# Patient Record
Sex: Female | Born: 1960
Health system: Southern US, Community
[De-identification: ages and names within clinical notes are randomized; demographics above are authoritative.]

## PROBLEM LIST (undated history)

## (undated) DIAGNOSIS — Z9889 Other specified postprocedural states: Secondary | ICD-10-CM

## (undated) DIAGNOSIS — E119 Type 2 diabetes mellitus without complications: Secondary | ICD-10-CM

## (undated) DIAGNOSIS — M1990 Unspecified osteoarthritis, unspecified site: Secondary | ICD-10-CM

## (undated) DIAGNOSIS — R519 Headache, unspecified: Secondary | ICD-10-CM

## (undated) DIAGNOSIS — R112 Nausea with vomiting, unspecified: Secondary | ICD-10-CM

## (undated) DIAGNOSIS — C801 Malignant (primary) neoplasm, unspecified: Secondary | ICD-10-CM

## (undated) DIAGNOSIS — J45909 Unspecified asthma, uncomplicated: Secondary | ICD-10-CM

## (undated) DIAGNOSIS — R51 Headache: Secondary | ICD-10-CM

## (undated) DIAGNOSIS — G4730 Sleep apnea, unspecified: Secondary | ICD-10-CM

## (undated) DIAGNOSIS — E785 Hyperlipidemia, unspecified: Secondary | ICD-10-CM

## (undated) DIAGNOSIS — F419 Anxiety disorder, unspecified: Secondary | ICD-10-CM

## (undated) HISTORY — DX: Type 2 diabetes mellitus without complications: E11.9

## (undated) HISTORY — PX: BREAST SURGERY: SHX581

## (undated) HISTORY — DX: Sleep apnea, unspecified: G47.30

## (undated) HISTORY — PX: MASTECTOMY: SHX3

## (undated) HISTORY — DX: Hyperlipidemia, unspecified: E78.5

## (undated) HISTORY — DX: Unspecified asthma, uncomplicated: J45.909

## (undated) HISTORY — PX: ABDOMINAL HYSTERECTOMY: SHX81

## (undated) HISTORY — PX: TOTAL ABDOMINAL HYSTERECTOMY: SHX209

## (undated) HISTORY — PX: KNEE ARTHROSCOPY: SUR90

## (undated) HISTORY — PX: BILATERAL TOTAL MASTECTOMY WITH AXILLARY LYMPH NODE DISSECTION: SHX6364

---

## 2008-09-30 ENCOUNTER — Ambulatory Visit: Payer: Self-pay | Admitting: Internal Medicine

## 2008-10-19 ENCOUNTER — Ambulatory Visit: Payer: Self-pay | Admitting: Internal Medicine

## 2008-12-17 ENCOUNTER — Ambulatory Visit: Payer: Self-pay | Admitting: Internal Medicine

## 2008-12-23 ENCOUNTER — Ambulatory Visit: Payer: Self-pay | Admitting: Surgery

## 2008-12-28 ENCOUNTER — Ambulatory Visit: Payer: Self-pay | Admitting: Internal Medicine

## 2009-01-12 ENCOUNTER — Inpatient Hospital Stay: Payer: Self-pay | Admitting: Surgery

## 2009-01-17 ENCOUNTER — Ambulatory Visit: Payer: Self-pay | Admitting: Internal Medicine

## 2009-02-14 ENCOUNTER — Ambulatory Visit: Payer: Self-pay | Admitting: Internal Medicine

## 2009-02-21 ENCOUNTER — Ambulatory Visit: Payer: Self-pay | Admitting: Internal Medicine

## 2009-03-17 ENCOUNTER — Ambulatory Visit: Payer: Self-pay | Admitting: Internal Medicine

## 2009-04-16 ENCOUNTER — Ambulatory Visit: Payer: Self-pay | Admitting: Internal Medicine

## 2009-05-17 ENCOUNTER — Ambulatory Visit: Payer: Self-pay | Admitting: Internal Medicine

## 2009-06-16 ENCOUNTER — Ambulatory Visit: Payer: Self-pay | Admitting: Internal Medicine

## 2009-06-29 ENCOUNTER — Ambulatory Visit: Payer: Self-pay | Admitting: Unknown Physician Specialty

## 2009-06-30 ENCOUNTER — Inpatient Hospital Stay: Payer: Self-pay | Admitting: Obstetrics & Gynecology

## 2009-07-17 ENCOUNTER — Ambulatory Visit: Payer: Self-pay | Admitting: Internal Medicine

## 2009-08-11 ENCOUNTER — Ambulatory Visit: Payer: Self-pay | Admitting: Internal Medicine

## 2009-08-17 ENCOUNTER — Ambulatory Visit: Payer: Self-pay | Admitting: Internal Medicine

## 2009-09-16 ENCOUNTER — Ambulatory Visit: Payer: Self-pay | Admitting: Internal Medicine

## 2009-10-17 ENCOUNTER — Ambulatory Visit: Payer: Self-pay | Admitting: Internal Medicine

## 2009-11-16 ENCOUNTER — Ambulatory Visit: Payer: Self-pay | Admitting: Internal Medicine

## 2009-12-12 ENCOUNTER — Ambulatory Visit: Payer: Self-pay | Admitting: Surgery

## 2010-01-02 IMAGING — US ULTRASOUND LEFT BREAST
1 series · 17 of 21 positions shown · non-contrast
Comparison: none

REASON FOR EXAM: left breast spiculated density   US if needed
COMMENTS:

[Series 1: ultrasound left breast · 17 of 21 slices shown]
[im 1/21]
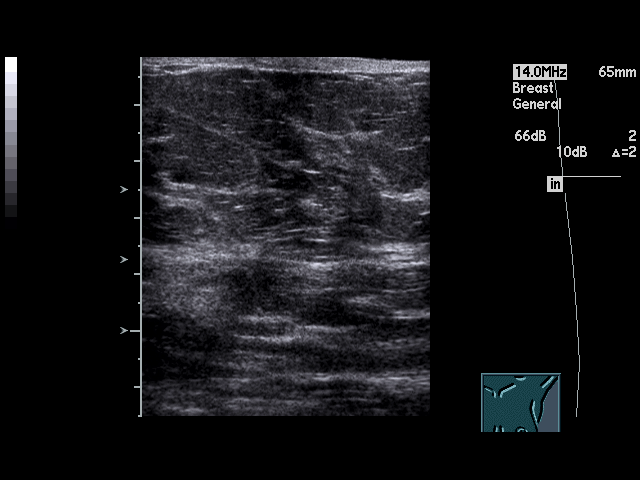
[im 2/21]
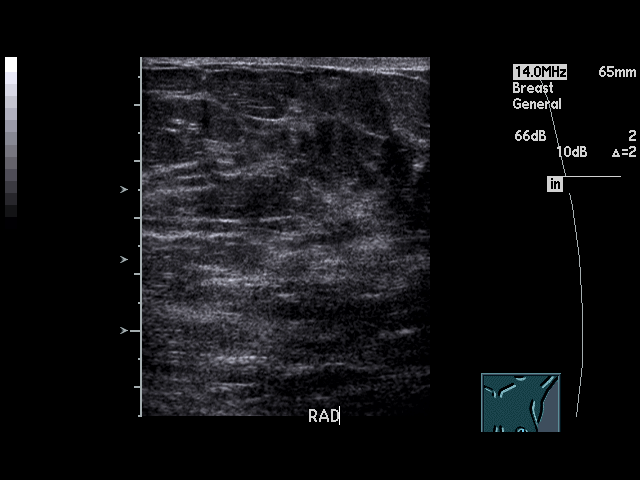
[im 4/21]
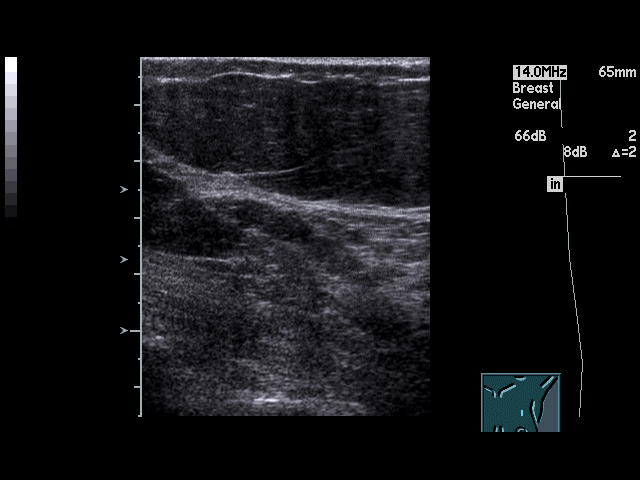
[im 5/21]
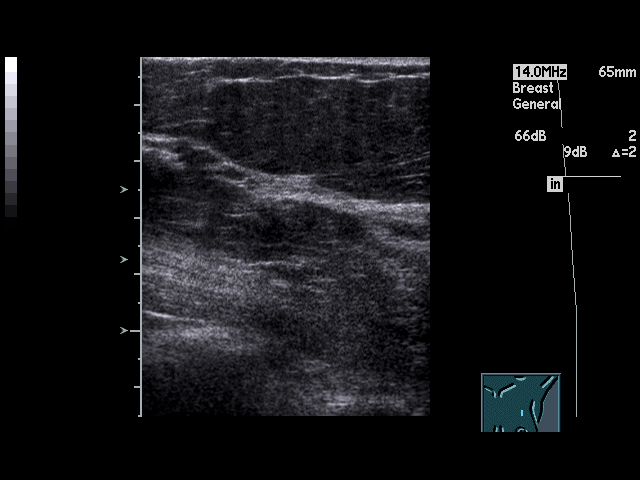
[im 6/21]
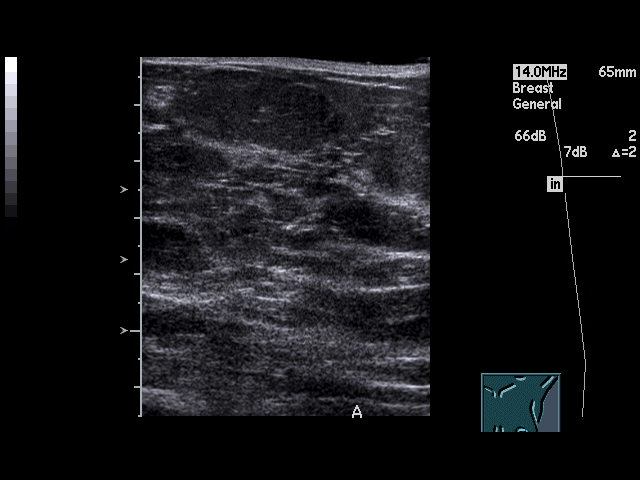
[im 7/21]
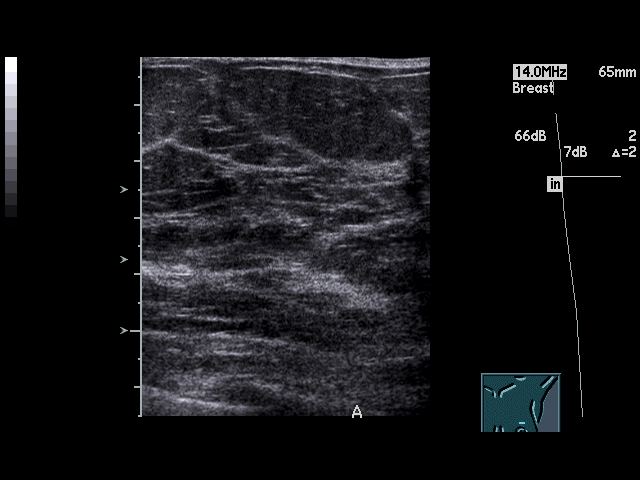
[im 9/21]
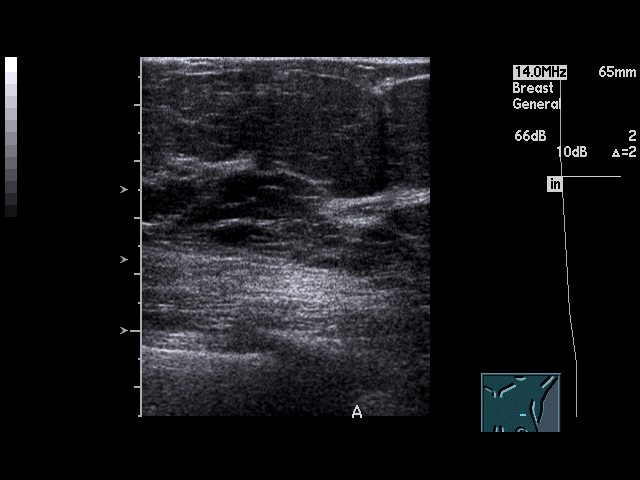
[im 10/21]
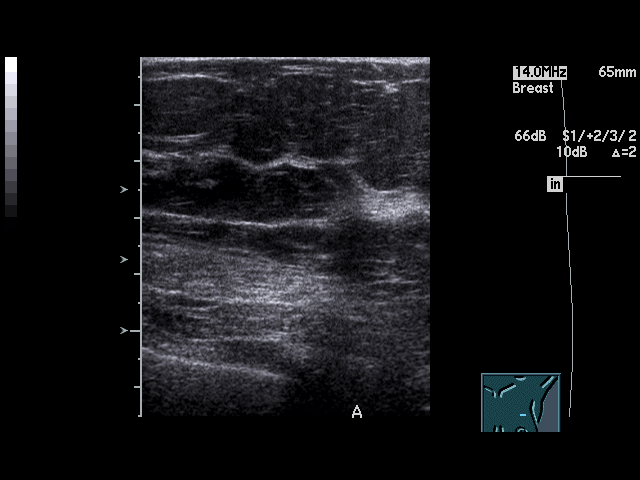
[im 11/21]
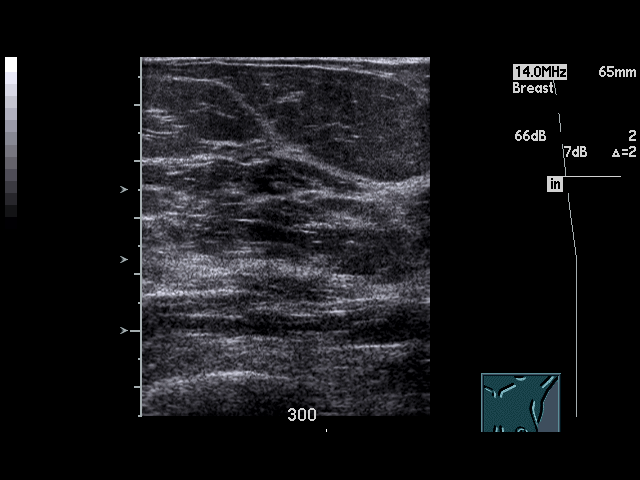
[im 12/21]
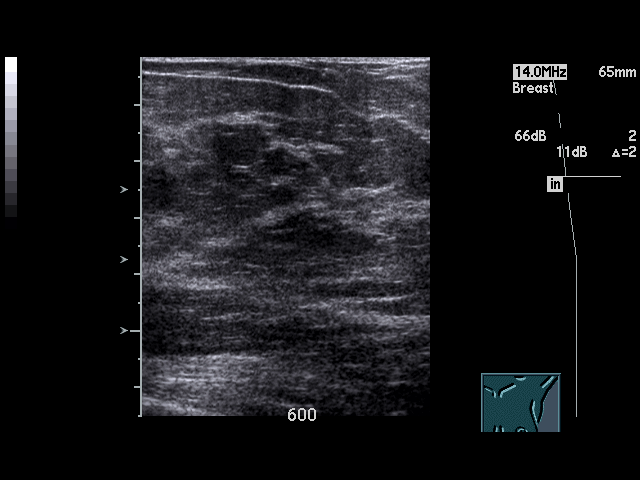
[im 13/21]
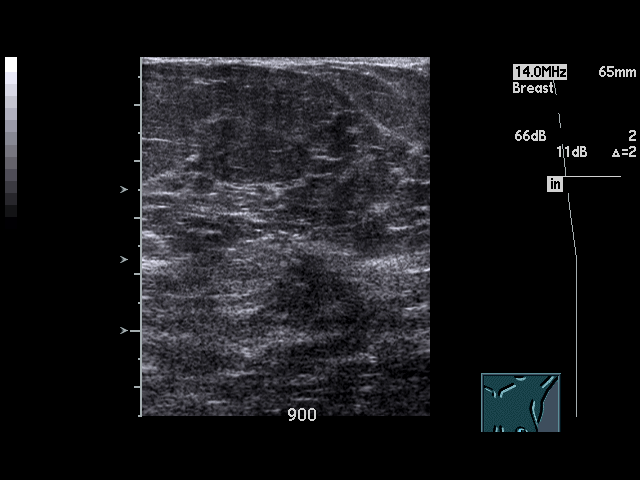
[im 15/21]
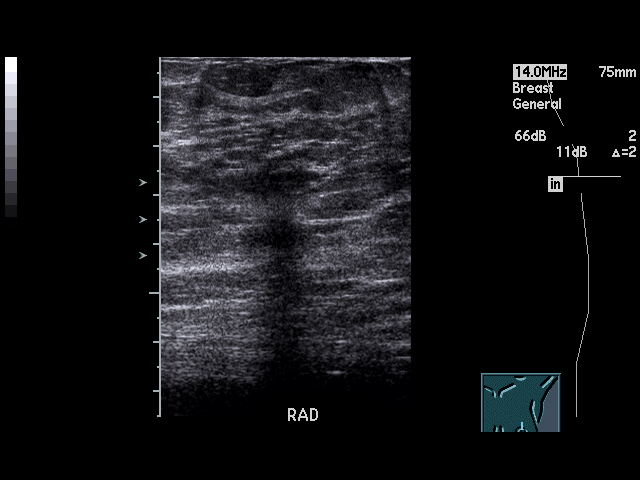
[im 16/21]
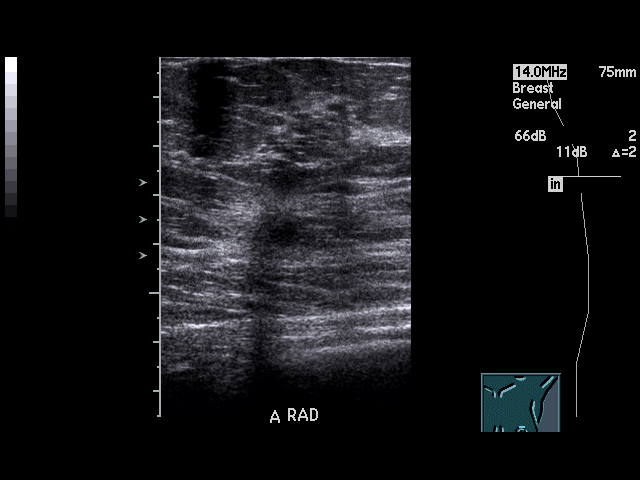
[im 17/21]
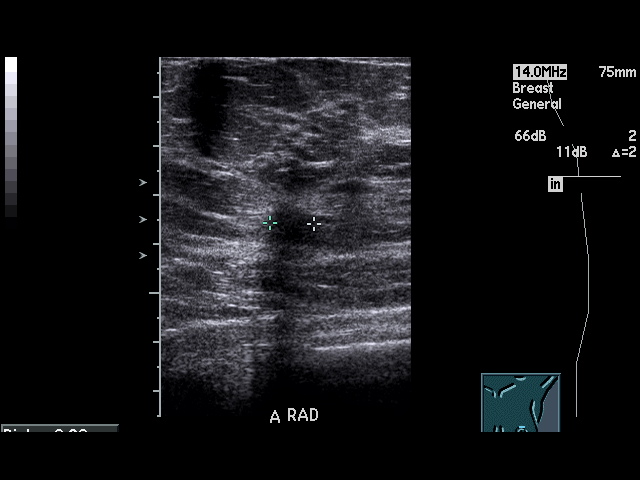
[im 18/21]
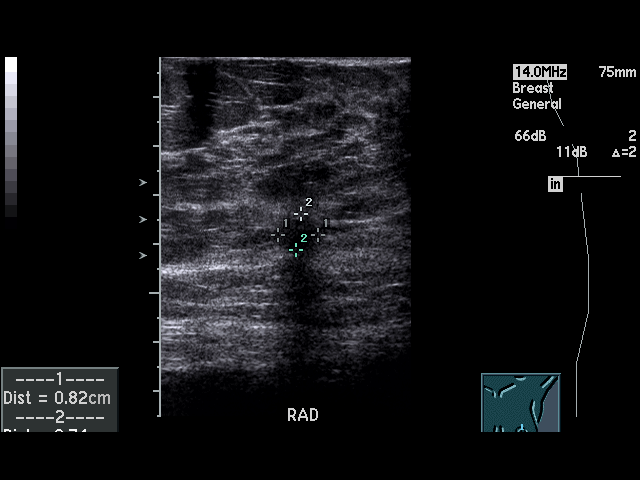
[im 20/21]
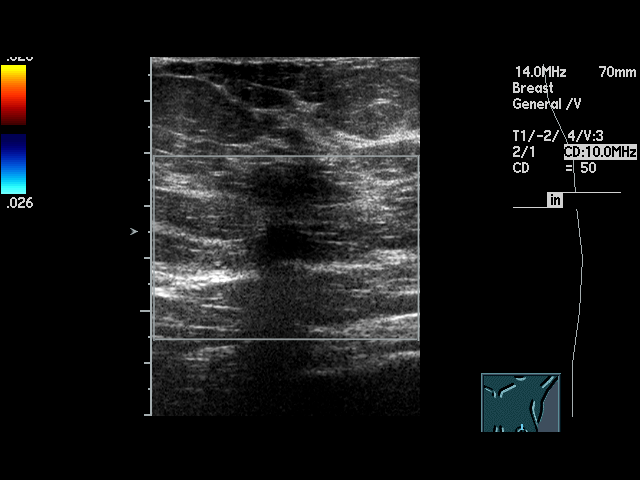
[im 21/21]
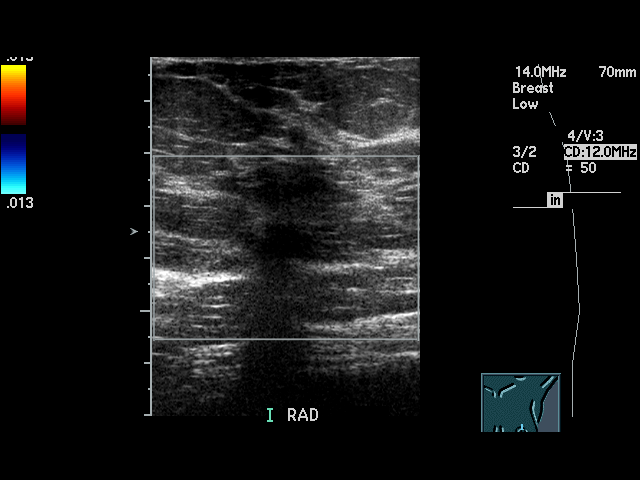

[17 of 21 positions shown; findings below may reference images not displayed]

PROCEDURE:     US  - US BREAST LEFT  - October 19, 2008 [DATE]

RESULT:     The region within the LEFT breast in a retroareolar location was
further evaluated with ultrasound. A small, hypoechoic nodule with acoustic
shadowing is appreciated at the 12 o'clock position in a retroareolar
location. This area measures 8.2 x 7.4 x 9 mm. There does not appear to be
associated flow though there is acoustic shadowing. The area demonstrates
ill-defined borders and is suspicious. Further evaluation with surgical
consultation is recommended.
IMPRESSION: 1. Suspicious nodule in the region of concern. Please refer to the
additional radiographic view dictation for completed discussion.

## 2010-01-17 ENCOUNTER — Ambulatory Visit: Payer: Self-pay | Admitting: Internal Medicine

## 2010-01-26 ENCOUNTER — Ambulatory Visit: Payer: Self-pay | Admitting: Internal Medicine

## 2010-02-14 ENCOUNTER — Ambulatory Visit: Payer: Self-pay | Admitting: Internal Medicine

## 2010-03-15 IMAGING — US ULTRASOUND RIGHT BREAST
1 series · 17 of 25 positions shown · non-contrast
Comparison: none

REASON FOR EXAM: nodule
COMMENTS:

PROCEDURE:     US  - US BREAST RIGHT  - December 30, 2008  [DATE]
RESULT:       The RIGHT breast was evaluated in the region of interest from
the 6 o'clock to the 12 o'clock position.  No solid or cystic sonographic
abnormalities are identified.

[Series 1: ultrasound right breast · 17 of 41 slices shown]
[im 1/41]
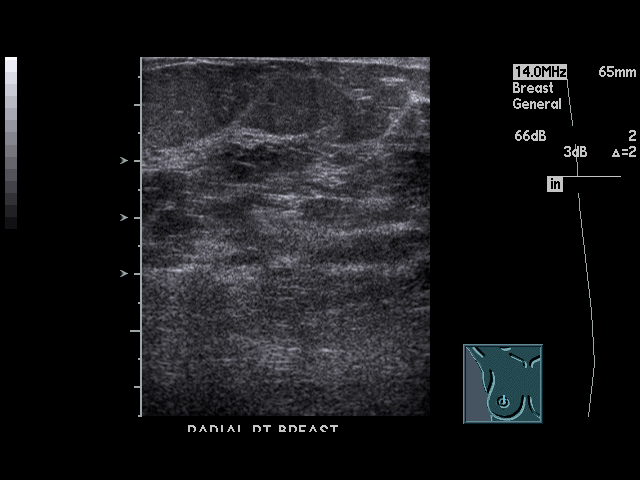
[im 4/41]
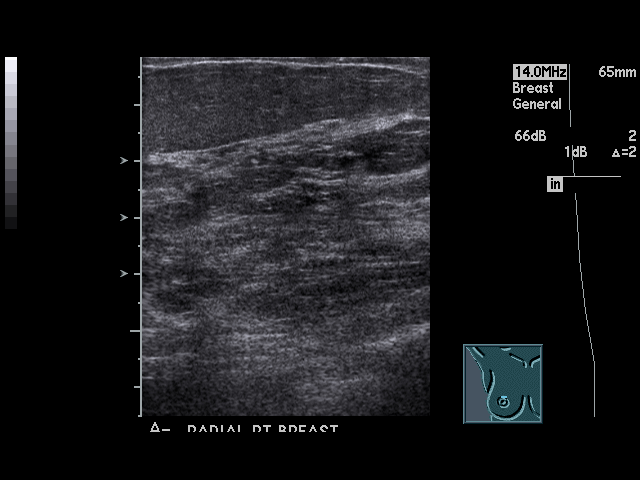
[im 6/41]
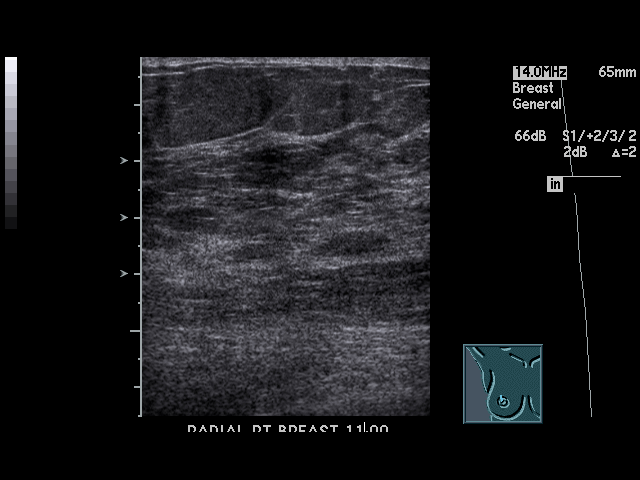
[im 9/41]
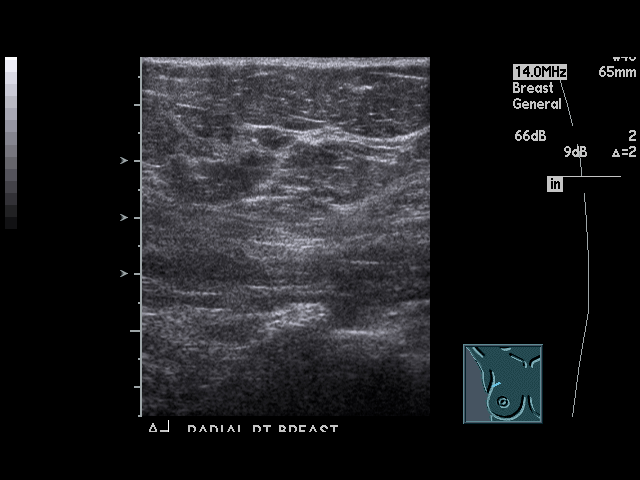
[im 11/41]
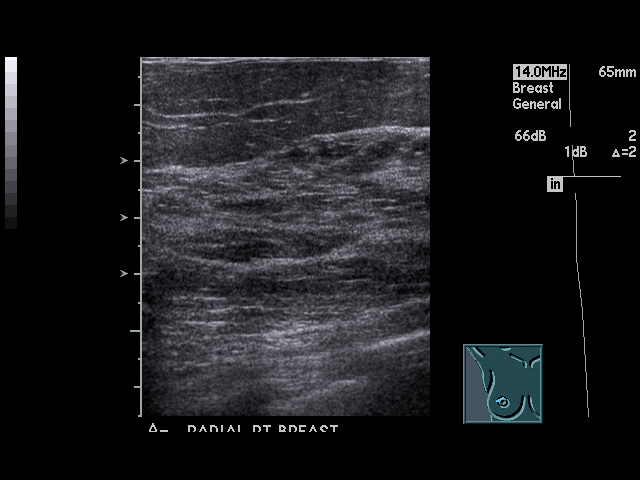
[im 14/41]
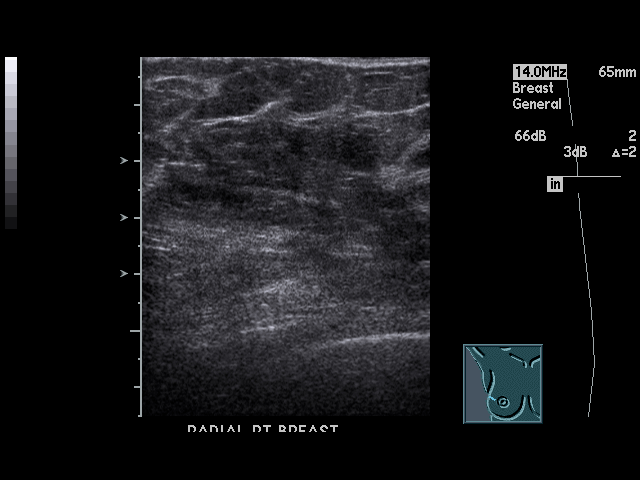
[im 16/41]
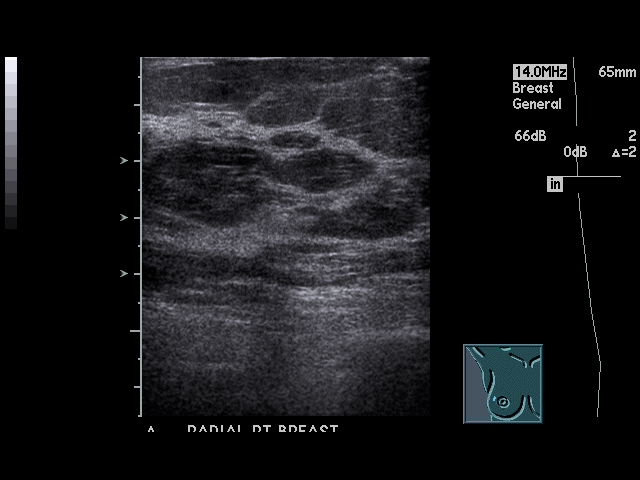
[im 19/41]
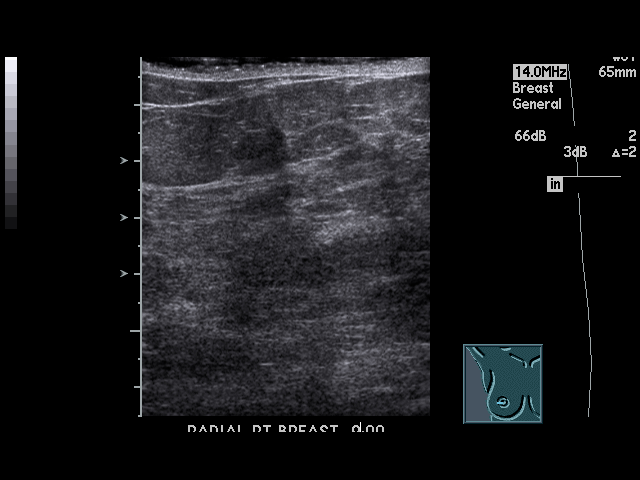
[im 21/41]
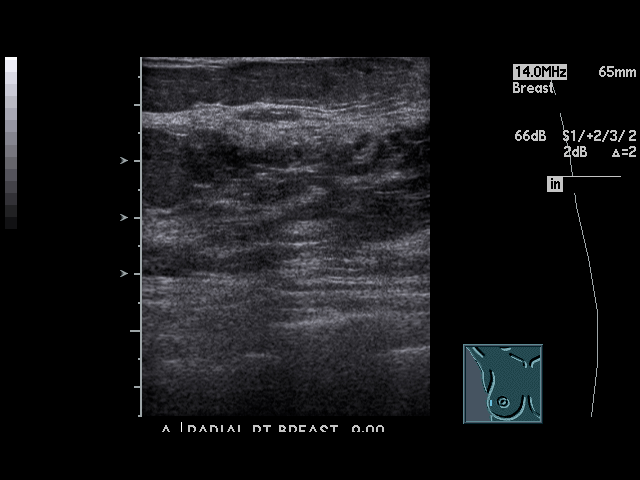
[im 22/41]
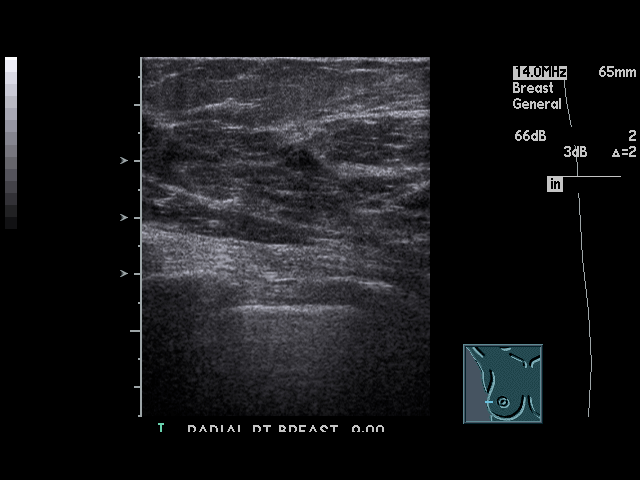
[im 26/41]
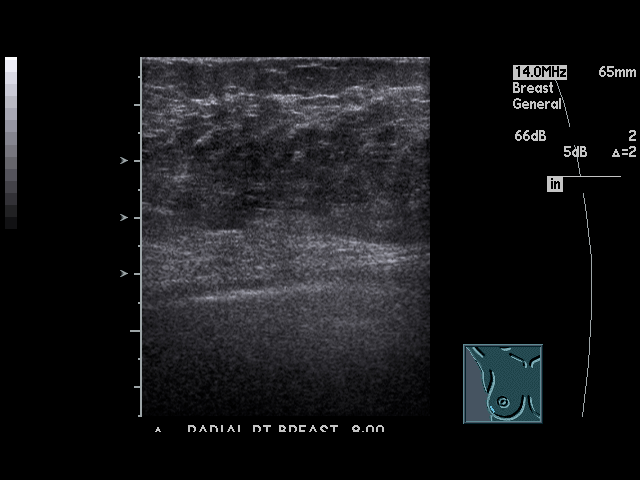
[im 27/41]
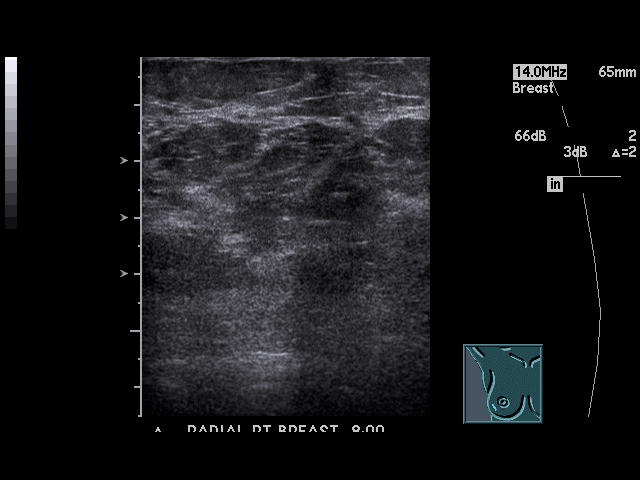
[im 31/41]
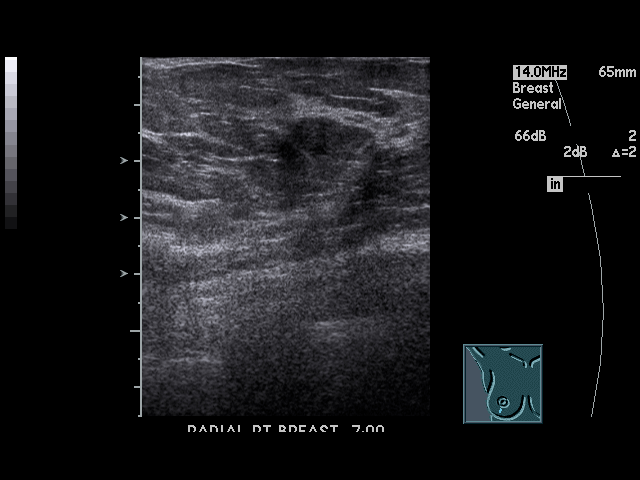
[im 32/41]
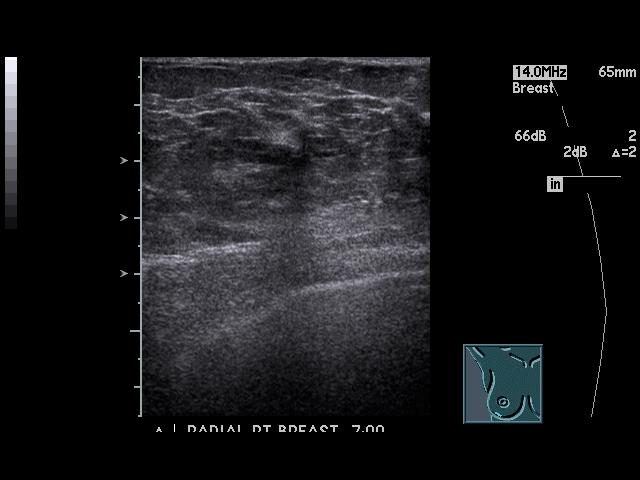
[im 36/41]
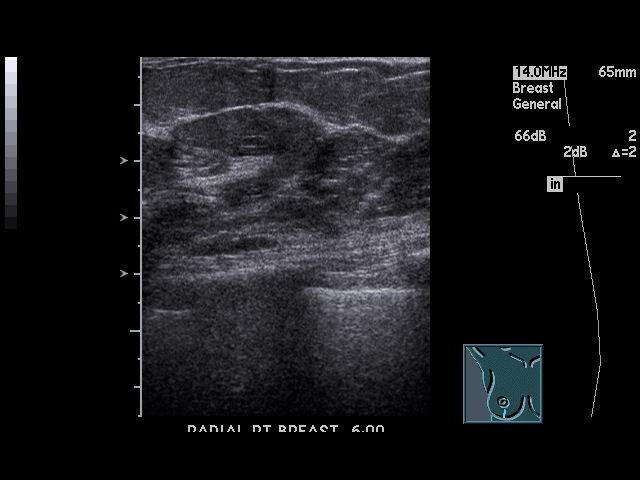
[im 37/41]
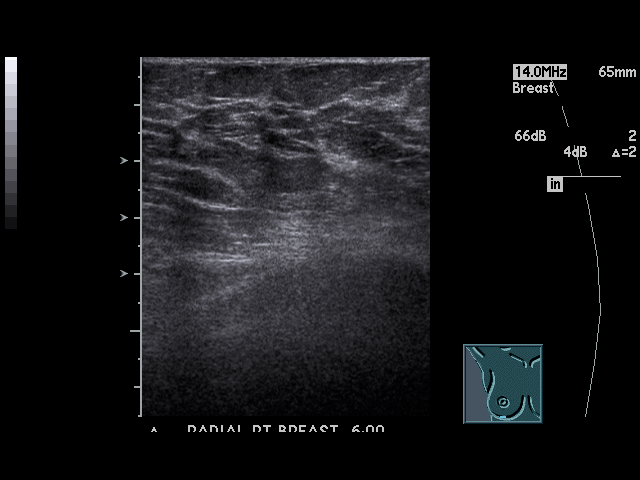
[im 41/41]
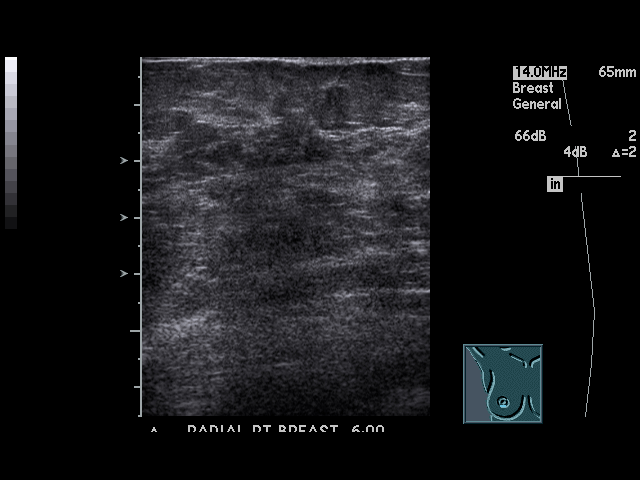

[17 of 25 positions shown; findings below may reference images not displayed]

IMPRESSION: Unremarkable RIGHT breast ultrasound.  Please refer to the additional
radiographic view dictation for complete discussion.

## 2010-05-04 ENCOUNTER — Ambulatory Visit: Payer: Self-pay

## 2010-05-11 ENCOUNTER — Ambulatory Visit: Payer: Self-pay | Admitting: Surgery

## 2010-06-01 ENCOUNTER — Ambulatory Visit: Payer: Self-pay | Admitting: Internal Medicine

## 2010-06-16 ENCOUNTER — Ambulatory Visit: Payer: Self-pay | Admitting: Internal Medicine

## 2010-07-07 ENCOUNTER — Inpatient Hospital Stay: Payer: Self-pay | Admitting: Surgery

## 2010-07-25 ENCOUNTER — Ambulatory Visit: Payer: Self-pay

## 2010-09-19 ENCOUNTER — Ambulatory Visit: Payer: Self-pay

## 2010-11-16 ENCOUNTER — Ambulatory Visit: Payer: Self-pay | Admitting: Internal Medicine

## 2010-11-17 LAB — CANCER ANTIGEN 27.29: CA 27.29: 11.2 U/mL (ref 0.0–38.6)

## 2010-12-17 ENCOUNTER — Ambulatory Visit: Payer: Self-pay | Admitting: Internal Medicine

## 2010-12-20 ENCOUNTER — Ambulatory Visit: Payer: Self-pay | Admitting: Rheumatology

## 2011-01-12 ENCOUNTER — Ambulatory Visit: Payer: Self-pay

## 2011-01-17 ENCOUNTER — Ambulatory Visit: Payer: Self-pay | Admitting: Internal Medicine

## 2011-02-20 ENCOUNTER — Ambulatory Visit: Payer: Self-pay

## 2011-02-23 ENCOUNTER — Ambulatory Visit: Payer: Self-pay | Admitting: Gastroenterology

## 2011-05-17 ENCOUNTER — Ambulatory Visit: Payer: Self-pay | Admitting: Internal Medicine

## 2011-05-18 ENCOUNTER — Ambulatory Visit: Payer: Self-pay | Admitting: Internal Medicine

## 2011-05-18 LAB — CANCER ANTIGEN 27.29: CA 27.29: 16.7 U/mL (ref 0.0–38.6)

## 2011-09-25 IMAGING — XA DG CHEST 1V
1 series · 1 of 1 positions shown · non-contrast
Comparison: none

REASON FOR EXAM: PICC Line Placement
COMMENTS:

PROCEDURE:     VAS - CHEST FRONTAL SINGLE VIEW  - July 12, 2010 [DATE]
RESULT:     Comparison: None.

[Series 1: single · 1 of 1 slices shown]
[im 1/1]
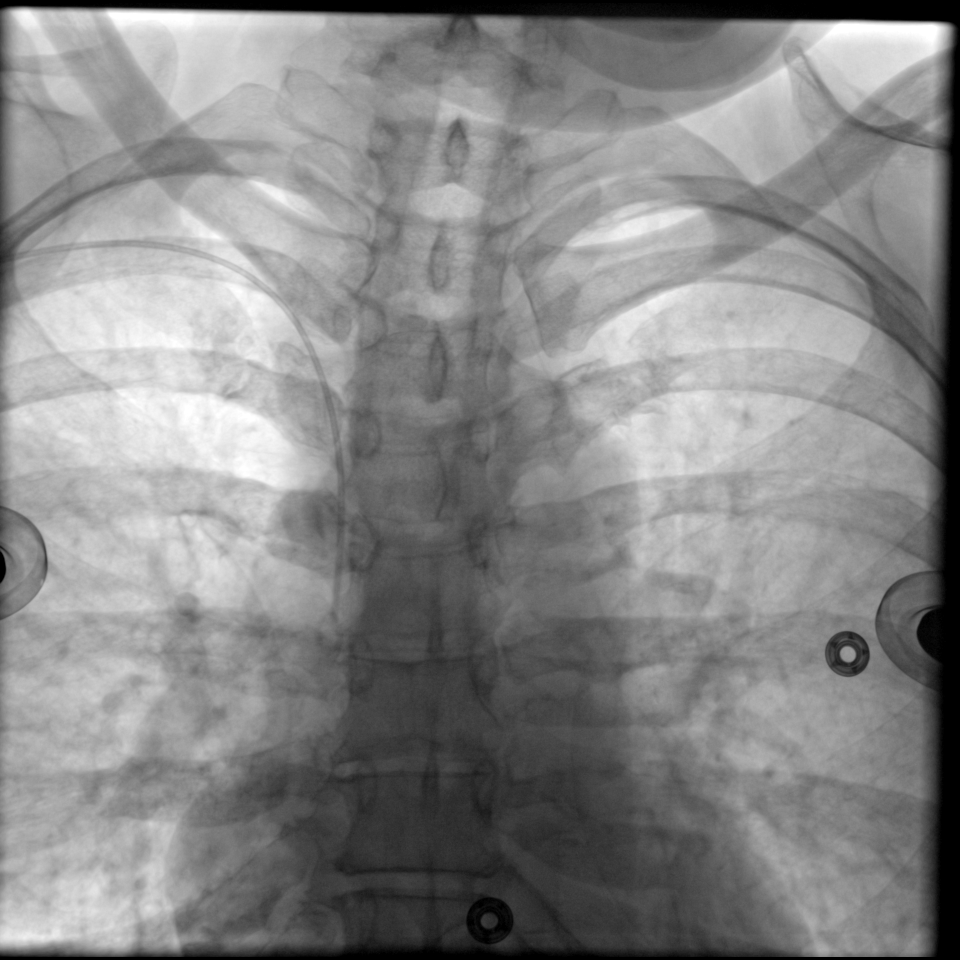

[1 of 1 positions shown; findings below may reference images not displayed]

FINDINGS: Single fluoroscopic imaging of the central chest demonstrates the PICC
catheter in the mid SVC.
IMPRESSION: PICC catheter tip in the mid SVC.

## 2011-11-15 ENCOUNTER — Ambulatory Visit: Payer: Self-pay | Admitting: Internal Medicine

## 2011-11-17 ENCOUNTER — Ambulatory Visit: Payer: Self-pay | Admitting: Internal Medicine

## 2012-05-22 ENCOUNTER — Ambulatory Visit: Payer: Self-pay | Admitting: Internal Medicine

## 2012-05-22 ENCOUNTER — Ambulatory Visit: Payer: Self-pay

## 2012-05-22 LAB — HEPATIC FUNCTION PANEL A (ARMC)
Albumin: 3.7 g/dL (ref 3.4–5.0)
Alkaline Phosphatase: 167 U/L — ABNORMAL HIGH (ref 50–136)
Bilirubin,Total: 0.2 mg/dL (ref 0.2–1.0)
SGOT(AST): 18 U/L (ref 15–37)
Total Protein: 7 g/dL (ref 6.4–8.2)

## 2012-05-22 LAB — CBC CANCER CENTER
Basophil %: 0.5 %
Eosinophil #: 0.2 x10 3/mm (ref 0.0–0.7)
HCT: 41.1 % (ref 35.0–47.0)
HGB: 13.8 g/dL (ref 12.0–16.0)
Lymphocyte #: 2.5 x10 3/mm (ref 1.0–3.6)
MCHC: 33.6 g/dL (ref 32.0–36.0)
Monocyte #: 0.5 x10 3/mm (ref 0.2–0.9)
Platelet: 234 x10 3/mm (ref 150–440)
RBC: 4.66 10*6/uL (ref 3.80–5.20)
RDW: 13 % (ref 11.5–14.5)
WBC: 6.3 x10 3/mm (ref 3.6–11.0)

## 2012-05-22 LAB — BASIC METABOLIC PANEL
BUN: 13 mg/dL (ref 7–18)
Chloride: 107 mmol/L (ref 98–107)
EGFR (Non-African Amer.): >60
Glucose: 95 mg/dL (ref 65–99)
Osmolality: 283 (ref 275–301)
Potassium: 4.2 mmol/L (ref 3.5–5.1)
Sodium: 142 mmol/L (ref 136–145)

## 2012-05-22 LAB — LIPID PANEL
HDL Cholesterol: 45 mg/dL (ref 40–60)
Triglycerides: 133 mg/dL (ref 0–200)

## 2012-05-22 LAB — TSH: Thyroid Stimulating Horm: 4.51 u[IU]/mL — ABNORMAL HIGH

## 2012-05-22 LAB — CREATININE, SERUM: EGFR (Non-African Amer.): >60

## 2012-05-23 LAB — CANCER ANTIGEN 27.29: CA 27.29: 13.9 U/mL (ref 0.0–38.6)

## 2012-06-16 ENCOUNTER — Ambulatory Visit: Payer: Self-pay | Admitting: Internal Medicine

## 2012-08-19 ENCOUNTER — Ambulatory Visit: Payer: Self-pay | Admitting: Internal Medicine

## 2012-12-25 ENCOUNTER — Ambulatory Visit: Payer: Self-pay | Admitting: Internal Medicine

## 2012-12-25 ENCOUNTER — Ambulatory Visit: Payer: Self-pay

## 2012-12-25 LAB — BASIC METABOLIC PANEL
Anion Gap: 9 (ref 7–16)
BUN: 14 mg/dL (ref 7–18)
Calcium, Total: 8.6 mg/dL (ref 8.5–10.1)
Co2: 27 mmol/L (ref 21–32)
Creatinine: 0.87 mg/dL (ref 0.60–1.30)
EGFR (Non-African Amer.): >60
Glucose: 96 mg/dL (ref 65–99)
Potassium: 4 mmol/L (ref 3.5–5.1)
Sodium: 139 mmol/L (ref 136–145)

## 2012-12-25 LAB — HEPATIC FUNCTION PANEL A (ARMC)
Alkaline Phosphatase: 146 U/L — ABNORMAL HIGH (ref 50–136)
Bilirubin, Direct: 0.1 mg/dL (ref 0.00–0.20)
SGPT (ALT): 20 U/L (ref 12–78)
Total Protein: 7.2 g/dL (ref 6.4–8.2)

## 2012-12-25 LAB — CBC CANCER CENTER
Basophil %: 1.1 %
Eosinophil #: 0.2 x10 3/mm (ref 0.0–0.7)
Eosinophil %: 2.2 %
Lymphocyte %: 33.5 %
MCH: 28.4 pg (ref 26.0–34.0)
MCV: 85 fL (ref 80–100)
Monocyte #: 0.6 x10 3/mm (ref 0.2–0.9)
Monocyte %: 8 %
Neutrophil %: 55.2 %
Platelet: 238 x10 3/mm (ref 150–440)
RDW: 12.9 % (ref 11.5–14.5)
WBC: 7 x10 3/mm (ref 3.6–11.0)

## 2012-12-25 LAB — LIPID PANEL
Cholesterol: 128 mg/dL (ref 0–200)
HDL Cholesterol: 39 mg/dL — ABNORMAL LOW (ref 40–60)
Triglycerides: 102 mg/dL (ref 0–200)
VLDL Cholesterol, Calc: 20 mg/dL (ref 5–40)

## 2012-12-25 LAB — CREATININE, SERUM: EGFR (African American): >60

## 2012-12-26 LAB — CANCER ANTIGEN 27.29: CA 27.29: 14.3 U/mL (ref 0.0–38.6)

## 2013-01-17 ENCOUNTER — Ambulatory Visit: Payer: Self-pay | Admitting: Internal Medicine

## 2014-01-01 ENCOUNTER — Ambulatory Visit: Payer: Self-pay | Admitting: Internal Medicine

## 2014-01-01 LAB — CBC CANCER CENTER
Basophil #: 0.1 x10 3/mm (ref 0.0–0.1)
Basophil %: 0.7 %
EOS PCT: 2 %
Eosinophil #: 0.2 x10 3/mm (ref 0.0–0.7)
HCT: 42.7 % (ref 35.0–47.0)
HGB: 14.3 g/dL (ref 12.0–16.0)
LYMPHS ABS: 2.8 x10 3/mm (ref 1.0–3.6)
LYMPHS PCT: 36.9 %
MCH: 28.7 pg (ref 26.0–34.0)
MCHC: 33.6 g/dL (ref 32.0–36.0)
MCV: 85 fL (ref 80–100)
MONOS PCT: 6.2 %
Monocyte #: 0.5 x10 3/mm (ref 0.2–0.9)
Neutrophil #: 4.2 x10 3/mm (ref 1.4–6.5)
Neutrophil %: 54.2 %
PLATELETS: 256 x10 3/mm (ref 150–440)
RBC: 5 10*6/uL (ref 3.80–5.20)
RDW: 12.7 % (ref 11.5–14.5)
WBC: 7.7 x10 3/mm (ref 3.6–11.0)

## 2014-01-01 LAB — HEPATIC FUNCTION PANEL A (ARMC)
Albumin: 3.7 g/dL (ref 3.4–5.0)
Alkaline Phosphatase: 135 U/L — ABNORMAL HIGH
BILIRUBIN DIRECT: 0.1 mg/dL (ref 0.00–0.20)
Bilirubin,Total: 0.3 mg/dL (ref 0.2–1.0)
SGOT(AST): 21 U/L (ref 15–37)
SGPT (ALT): 36 U/L (ref 12–78)
TOTAL PROTEIN: 7.2 g/dL (ref 6.4–8.2)

## 2014-01-01 LAB — CREATININE, SERUM
Creatinine: 0.77 mg/dL (ref 0.60–1.30)
EGFR (African American): >60
EGFR (Non-African Amer.): >60

## 2014-01-02 LAB — CANCER ANTIGEN 27.29: CA 27.29: 8 U/mL (ref 0.0–38.6)

## 2014-01-17 ENCOUNTER — Ambulatory Visit: Payer: Self-pay | Admitting: Internal Medicine

## 2016-02-08 DIAGNOSIS — Z79891 Long term (current) use of opiate analgesic: Secondary | ICD-10-CM | POA: Diagnosis not present

## 2016-02-08 DIAGNOSIS — G894 Chronic pain syndrome: Secondary | ICD-10-CM | POA: Diagnosis not present

## 2016-02-08 DIAGNOSIS — G8928 Other chronic postprocedural pain: Secondary | ICD-10-CM | POA: Diagnosis not present

## 2016-02-08 DIAGNOSIS — M792 Neuralgia and neuritis, unspecified: Secondary | ICD-10-CM | POA: Diagnosis not present

## 2016-02-09 DIAGNOSIS — E2839 Other primary ovarian failure: Secondary | ICD-10-CM | POA: Diagnosis not present

## 2016-05-28 ENCOUNTER — Encounter: Payer: Self-pay | Admitting: *Deleted

## 2016-05-29 ENCOUNTER — Encounter
Admission: RE | Disposition: A | Discharge status: Home / Self Care | Payer: Self-pay | Source: Ambulatory Visit | Attending: Gastroenterology

## 2016-05-29 ENCOUNTER — Ambulatory Visit
Admission: RE | Admit: 2016-05-29 | Discharge: 2016-05-29 | Disposition: A | Discharge status: Home / Self Care | Payer: Medicare Other | Source: Ambulatory Visit | Attending: Gastroenterology | Admitting: Gastroenterology

## 2016-05-29 ENCOUNTER — Ambulatory Visit: Payer: Medicare Other | Admitting: Anesthesiology

## 2016-05-29 DIAGNOSIS — Z1211 Encounter for screening for malignant neoplasm of colon: Secondary | ICD-10-CM | POA: Insufficient documentation

## 2016-05-29 DIAGNOSIS — Z800 Family history of malignant neoplasm of digestive organs: Secondary | ICD-10-CM | POA: Diagnosis not present

## 2016-05-29 DIAGNOSIS — M1990 Unspecified osteoarthritis, unspecified site: Secondary | ICD-10-CM | POA: Diagnosis not present

## 2016-05-29 DIAGNOSIS — Z7951 Long term (current) use of inhaled steroids: Secondary | ICD-10-CM | POA: Diagnosis not present

## 2016-05-29 DIAGNOSIS — K640 First degree hemorrhoids: Secondary | ICD-10-CM | POA: Diagnosis not present

## 2016-05-29 DIAGNOSIS — Z853 Personal history of malignant neoplasm of breast: Secondary | ICD-10-CM | POA: Diagnosis not present

## 2016-05-29 DIAGNOSIS — Z7982 Long term (current) use of aspirin: Secondary | ICD-10-CM | POA: Insufficient documentation

## 2016-05-29 DIAGNOSIS — Z79899 Other long term (current) drug therapy: Secondary | ICD-10-CM | POA: Insufficient documentation

## 2016-05-29 DIAGNOSIS — F419 Anxiety disorder, unspecified: Secondary | ICD-10-CM | POA: Insufficient documentation

## 2016-05-29 HISTORY — DX: Headache, unspecified: R51.9

## 2016-05-29 HISTORY — DX: Malignant (primary) neoplasm, unspecified: C80.1

## 2016-05-29 HISTORY — DX: Nausea with vomiting, unspecified: R11.2

## 2016-05-29 HISTORY — DX: Anxiety disorder, unspecified: F41.9

## 2016-05-29 HISTORY — DX: Unspecified osteoarthritis, unspecified site: M19.90

## 2016-05-29 HISTORY — PX: COLONOSCOPY WITH PROPOFOL: SHX5780

## 2016-05-29 HISTORY — DX: Headache: R51

## 2016-05-29 HISTORY — DX: Other specified postprocedural states: Z98.890

## 2016-05-29 SURGERY — COLONOSCOPY WITH PROPOFOL
Anesthesia: General

## 2016-05-29 MED ORDER — PROPOFOL 10 MG/ML IV BOLUS
INTRAVENOUS | Status: DC | PRN
  Administered 2016-05-29: 100 mg via INTRAVENOUS

## 2016-05-29 MED ORDER — FENTANYL CITRATE (PF) 100 MCG/2ML IJ SOLN
INTRAMUSCULAR | Status: DC | PRN
  Administered 2016-05-29: 50 ug via INTRAVENOUS

## 2016-05-29 MED ORDER — MIDAZOLAM HCL 5 MG/5ML IJ SOLN
INTRAMUSCULAR | Status: DC | PRN
  Administered 2016-05-29: 1 mg via INTRAVENOUS

## 2016-05-29 MED ORDER — SODIUM CHLORIDE 0.9 % IV SOLN: INTRAVENOUS | Status: DC

## 2016-05-29 MED ORDER — SODIUM CHLORIDE 0.9 % IV SOLN
INTRAVENOUS | Status: DC
  Administered 2016-05-29: 1000 mL via INTRAVENOUS

## 2016-05-29 MED ORDER — PROPOFOL 500 MG/50ML IV EMUL
INTRAVENOUS | Status: DC | PRN
  Administered 2016-05-29: 180 ug/kg/min via INTRAVENOUS

## 2016-05-29 MED ORDER — LIDOCAINE 2% (20 MG/ML) 5 ML SYRINGE
INTRAMUSCULAR | Status: DC | PRN
  Administered 2016-05-29: 40 mg via INTRAVENOUS

## 2016-05-29 MED ORDER — PHENYLEPHRINE HCL 10 MG/ML IJ SOLN
INTRAMUSCULAR | Status: DC | PRN
  Administered 2016-05-29 (×2): 100 ug via INTRAVENOUS

## 2016-05-29 NOTE — Anesthesia Preprocedure Evaluation (Signed)
Anesthesia Evaluation  Patient identified by MRN, date of birth, ID band Patient awake    Reviewed: Allergy & Precautions, NPO status , Patient's Chart, lab work & pertinent test results  History of Anesthesia Complications (+) PONV  Airway Mallampati: II       Dental   Pulmonary neg pulmonary ROS,           Cardiovascular negative cardio ROS       Neuro/Psych Anxiety negative neurological ROS     GI/Hepatic negative GI ROS, Neg liver ROS,   Endo/Other  negative endocrine ROS  Renal/GU negative Renal ROS     Musculoskeletal   Abdominal   Peds  Hematology negative hematology ROS (+)   Anesthesia Other Findings   Reproductive/Obstetrics                             Anesthesia Physical Anesthesia Plan  ASA: II  Anesthesia Plan: General   Post-op Pain Management:    Induction: Intravenous  Airway Management Planned: Nasal Cannula  Additional Equipment:   Intra-op Plan:   Post-operative Plan:   Informed Consent: I have reviewed the patients History and Physical, chart, labs and discussed the procedure including the risks, benefits and alternatives for the proposed anesthesia with the patient or authorized representative who has indicated his/her understanding and acceptance.     Plan Discussed with:   Anesthesia Plan Comments:         Anesthesia Quick Evaluation

## 2016-05-29 NOTE — Transfer of Care (Signed)
Immediate Anesthesia Transfer of Care Note  Patient: Janet Austin  Procedure(s) Performed: Procedure(s): COLONOSCOPY WITH PROPOFOL (N/A)  Patient Location: PACU and Endoscopy Unit  Anesthesia Type:General  Level of Consciousness: awake, oriented and patient cooperative  Airway & Oxygen Therapy: Patient Spontanous Breathing and Patient connected to nasal cannula oxygen  Post-op Assessment: Report given to RN and Post -op Vital signs reviewed and stable  Post vital signs: Reviewed and stable  Last Vitals:  Filed Vitals:   05/29/16 0748  BP: 127/91  Pulse: 86  Temp: 36 C  Resp: 18    Last Pain: There were no vitals filed for this visit.       Complications: No apparent anesthesia complications

## 2016-05-29 NOTE — Anesthesia Postprocedure Evaluation (Signed)
Anesthesia Post Note  Patient: Janet Austin  Procedure(s) Performed: Procedure(s) (LRB): COLONOSCOPY WITH PROPOFOL (N/A)  Patient location during evaluation: Endoscopy Anesthesia Type: General Level of consciousness: awake and alert Pain management: pain level controlled Vital Signs Assessment: post-procedure vital signs reviewed and stable Respiratory status: spontaneous breathing and respiratory function stable Cardiovascular status: stable Anesthetic complications: no    Last Vitals:  Filed Vitals:   05/29/16 0923 05/29/16 0933  BP: 110/76 108/79  Pulse: 72 68  Temp:    Resp: 17 16    Last Pain: There were no vitals filed for this visit.               Torey Reinard K

## 2016-05-29 NOTE — Op Note (Signed)
[facility] Gastroenterology Patient Name: Janet Austin Procedure Date: 05/29/2016 8:34 AM MRN: [id number] Account #: [id number] Date of Birth: [birth year] Admit Type: Outpatient Age: 55 Room: [facility] ENDO ROOM 3 Gender: Female Note Status: Finalized Procedure:            Colonoscopy Indications:          Family history of colonic polyps in a first-degree                        relative Providers:            Lollie Sails, MD Referring MD:         Lavera Guise, MD (Referring MD) Medicines:            Monitored Anesthesia Care Complications:        No immediate complications. Procedure:            Pre-Anesthesia Assessment:                       - ASA Grade Assessment: III - A patient with severe                        systemic disease.                       After obtaining informed consent, the colonoscope was                        passed under direct vision. Throughout the procedure,                        the patient's blood pressure, pulse, and oxygen                        saturations were monitored continuously. The                        Colonoscope was introduced through the anus and                        advanced to the the cecum, identified by appendiceal                        orifice and ileocecal valve. The colonoscopy was                        performed without difficulty. The patient tolerated the                        procedure well. The quality of the bowel preparation                        was good. Findings:      The colon (entire examined portion) appeared normal.      Non-bleeding internal hemorrhoids were found during anoscopy. The       hemorrhoids were small and Grade I (internal hemorrhoids that do not       prolapse).      The digital rectal exam was normal. Impression:           - The entire examined colon is normal.                       -  Non-bleeding internal hemorrhoids.                       - No specimens  collected. Recommendation:       - Discharge patient to home. Procedure Code(s):    --- Professional ---                       [phone], Colonoscopy, flexible; diagnostic, including                        collection of specimen(s) by brushing or washing, when                        performed (separate procedure) Diagnosis Code(s):    --- Professional ---                       K64.0, First degree hemorrhoids                       Z83.71, Family history of colonic polyps CPT copyright 2016 [employer]. All rights reserved. The codes documented in this report are preliminary and upon coder review may  be revised to meet current compliance requirements. Lollie Sails, MD 05/29/2016 8:59:50 AM This report has been signed electronically. Number of Addenda: 0 Note Initiated On: 05/29/2016 8:34 AM Scope Withdrawal Time: 0 hours 8 minutes 3 seconds  Total Procedure Duration: 0 hours 13 minutes 43 seconds       [facility]

## 2016-05-29 NOTE — H&P (Signed)
Outpatient short stay form Pre-procedure 05/29/2016 8:31 AM Janet Sails MD  Primary Physician: Dr Clayborn Bigness  Reason for visit:  Colonoscopy  History of present illness:  Patient is a 55 year old female presenting today for colonoscopy. She has family history of colon cancer in primary relatives, father. She tolerated her prep well. She takes no aspirin products or blood thinning agents. Her last colonoscopy was about 5 years ago negative for polyps at that time.    Current facility-administered medications:  .  0.9 %  sodium chloride infusion, , Intravenous, Continuous, Janet Sails, MD, Last Rate: 20 mL/hr at 05/29/16 0801, 1,000 mL at 05/29/16 0801 .  0.9 %  sodium chloride infusion, , Intravenous, Continuous, Janet Sails, MD  Prescriptions prior to admission  Medication Sig Dispense Refill Last Dose  . albuterol (PROVENTIL HFA;VENTOLIN HFA) 108 (90 Base) MCG/ACT inhaler Inhale into the lungs every 6 (six) hours as needed for wheezing or shortness of breath.     . ALPRAZolam (XANAX) 1 MG tablet Take 1 mg by mouth at bedtime as needed for anxiety.     Marland Kitchen atorvastatin (LIPITOR) 10 MG tablet Take 10 mg by mouth daily.     . cyclobenzaprine (FLEXERIL) 5 MG tablet Take 5 mg by mouth 3 (three) times daily as needed for muscle spasms.     . fluticasone (FLONASE) 50 MCG/ACT nasal spray Place into both nostrils daily.     . OxyCODONE HCl, Abuse Deter, (OXAYDO) 5 MG TABA Take by mouth.     . promethazine-codeine (PHENERGAN WITH CODEINE) 6.25-10 MG/5ML syrup Take by mouth every 6 (six) hours as needed for cough.     . valACYclovir (VALTREX) 1000 MG tablet Take 1,000 mg by mouth 2 (two) times daily.     Marland Kitchen zolpidem (AMBIEN) 10 MG tablet Take 10 mg by mouth at bedtime as needed for sleep.        Allergies  Allergen Reactions  . Asa [Aspirin]   . Gabapentin Other (See Comments)    Eye twitch  . Lyrica [Pregabalin] Other (See Comments)    Altered mental status     Past  Medical History  Diagnosis Date  . Cancer ([place])     left breast  . Headache     migraines  . PONV (postoperative nausea and vomiting)   . Anxiety   . Arthritis     Review of systems:      Physical Exam    Heart and lungs: Regular rate and rhythm without rub or gallop, lungs are bilaterally clear.    HEENT: Normocephalic atraumatic eyes are anicteric    Other:     Pertinant exam for procedure: Soft nontender nondistended bowel sounds positive normoactive.    Planned proceedures: Colonoscopy and indicated procedures. I have discussed the risks benefits and complications of procedures to include not limited to bleeding, infection, perforation and the risk of sedation and the patient wishes to proceed.    Janet Sails, MD Gastroenterology 05/29/2016  8:31 AM

## 2016-05-30 ENCOUNTER — Encounter: Payer: Self-pay | Admitting: Gastroenterology

## 2016-06-01 DIAGNOSIS — Z79891 Long term (current) use of opiate analgesic: Secondary | ICD-10-CM | POA: Diagnosis not present

## 2016-06-01 DIAGNOSIS — G8928 Other chronic postprocedural pain: Secondary | ICD-10-CM | POA: Diagnosis not present

## 2016-06-01 DIAGNOSIS — G894 Chronic pain syndrome: Secondary | ICD-10-CM | POA: Diagnosis not present

## 2016-06-01 DIAGNOSIS — M792 Neuralgia and neuritis, unspecified: Secondary | ICD-10-CM | POA: Diagnosis not present

## 2016-07-19 DIAGNOSIS — F411 Generalized anxiety disorder: Secondary | ICD-10-CM | POA: Diagnosis not present

## 2016-07-19 DIAGNOSIS — F5101 Primary insomnia: Secondary | ICD-10-CM | POA: Diagnosis not present

## 2016-07-19 DIAGNOSIS — E782 Mixed hyperlipidemia: Secondary | ICD-10-CM | POA: Diagnosis not present

## 2016-09-04 DIAGNOSIS — C50912 Malignant neoplasm of unspecified site of left female breast: Secondary | ICD-10-CM | POA: Diagnosis not present

## 2016-09-07 DIAGNOSIS — C50912 Malignant neoplasm of unspecified site of left female breast: Secondary | ICD-10-CM | POA: Diagnosis not present

## 2016-09-07 DIAGNOSIS — E278 Other specified disorders of adrenal gland: Secondary | ICD-10-CM | POA: Diagnosis not present

## 2016-09-23 DIAGNOSIS — D3501 Benign neoplasm of right adrenal gland: Secondary | ICD-10-CM | POA: Diagnosis not present

## 2016-09-23 DIAGNOSIS — K760 Fatty (change of) liver, not elsewhere classified: Secondary | ICD-10-CM | POA: Diagnosis not present

## 2016-09-23 DIAGNOSIS — E278 Other specified disorders of adrenal gland: Secondary | ICD-10-CM | POA: Diagnosis not present

## 2016-09-23 DIAGNOSIS — D3502 Benign neoplasm of left adrenal gland: Secondary | ICD-10-CM | POA: Diagnosis not present

## 2016-09-23 DIAGNOSIS — K862 Cyst of pancreas: Secondary | ICD-10-CM | POA: Diagnosis not present

## 2016-09-23 DIAGNOSIS — C50912 Malignant neoplasm of unspecified site of left female breast: Secondary | ICD-10-CM | POA: Diagnosis not present

## 2016-10-09 DIAGNOSIS — G894 Chronic pain syndrome: Secondary | ICD-10-CM | POA: Diagnosis not present

## 2016-10-09 DIAGNOSIS — G8928 Other chronic postprocedural pain: Secondary | ICD-10-CM | POA: Diagnosis not present

## 2016-10-09 DIAGNOSIS — Z79891 Long term (current) use of opiate analgesic: Secondary | ICD-10-CM | POA: Diagnosis not present

## 2016-10-09 DIAGNOSIS — M792 Neuralgia and neuritis, unspecified: Secondary | ICD-10-CM | POA: Diagnosis not present

## 2016-11-02 DIAGNOSIS — J069 Acute upper respiratory infection, unspecified: Secondary | ICD-10-CM | POA: Diagnosis not present

## 2016-11-02 DIAGNOSIS — R062 Wheezing: Secondary | ICD-10-CM | POA: Diagnosis not present

## 2016-11-02 DIAGNOSIS — R05 Cough: Secondary | ICD-10-CM | POA: Diagnosis not present

## 2016-11-13 ENCOUNTER — Other Ambulatory Visit: Payer: Self-pay | Admitting: Nurse Practitioner

## 2016-11-13 ENCOUNTER — Ambulatory Visit
Admission: RE | Admit: 2016-11-13 | Discharge: 2016-11-13 | Disposition: A | Discharge status: Home / Self Care | Payer: Medicare Other | Source: Ambulatory Visit | Attending: Nurse Practitioner | Admitting: Nurse Practitioner

## 2016-11-13 DIAGNOSIS — R05 Cough: Secondary | ICD-10-CM | POA: Diagnosis not present

## 2016-11-13 DIAGNOSIS — R059 Cough, unspecified: Secondary | ICD-10-CM

## 2016-12-21 DIAGNOSIS — R918 Other nonspecific abnormal finding of lung field: Secondary | ICD-10-CM | POA: Diagnosis not present

## 2016-12-21 DIAGNOSIS — Z9882 Breast implant status: Secondary | ICD-10-CM | POA: Diagnosis not present

## 2016-12-21 DIAGNOSIS — C50912 Malignant neoplasm of unspecified site of left female breast: Secondary | ICD-10-CM | POA: Diagnosis not present

## 2016-12-28 DIAGNOSIS — Z170 Estrogen receptor positive status [ER+]: Secondary | ICD-10-CM | POA: Diagnosis not present

## 2016-12-28 DIAGNOSIS — C50919 Malignant neoplasm of unspecified site of unspecified female breast: Secondary | ICD-10-CM | POA: Diagnosis not present

## 2016-12-29 DIAGNOSIS — C50919 Malignant neoplasm of unspecified site of unspecified female breast: Secondary | ICD-10-CM | POA: Insufficient documentation

## 2017-01-15 DIAGNOSIS — G894 Chronic pain syndrome: Secondary | ICD-10-CM | POA: Diagnosis not present

## 2017-01-15 DIAGNOSIS — M792 Neuralgia and neuritis, unspecified: Secondary | ICD-10-CM | POA: Diagnosis not present

## 2017-01-15 DIAGNOSIS — G8928 Other chronic postprocedural pain: Secondary | ICD-10-CM | POA: Diagnosis not present

## 2017-01-15 DIAGNOSIS — Z79891 Long term (current) use of opiate analgesic: Secondary | ICD-10-CM | POA: Diagnosis not present

## 2017-01-24 DIAGNOSIS — N390 Urinary tract infection, site not specified: Secondary | ICD-10-CM | POA: Diagnosis not present

## 2017-01-24 DIAGNOSIS — Z853 Personal history of malignant neoplasm of breast: Secondary | ICD-10-CM | POA: Diagnosis not present

## 2017-01-24 DIAGNOSIS — B373 Candidiasis of vulva and vagina: Secondary | ICD-10-CM | POA: Diagnosis not present

## 2017-01-24 DIAGNOSIS — J45991 Cough variant asthma: Secondary | ICD-10-CM | POA: Diagnosis not present

## 2017-01-29 DIAGNOSIS — E039 Hypothyroidism, unspecified: Secondary | ICD-10-CM | POA: Diagnosis not present

## 2017-01-29 DIAGNOSIS — E559 Vitamin D deficiency, unspecified: Secondary | ICD-10-CM | POA: Diagnosis not present

## 2017-01-29 DIAGNOSIS — D509 Iron deficiency anemia, unspecified: Secondary | ICD-10-CM | POA: Diagnosis not present

## 2017-01-29 DIAGNOSIS — Z0001 Encounter for general adult medical examination with abnormal findings: Secondary | ICD-10-CM | POA: Diagnosis not present

## 2017-03-27 DIAGNOSIS — Z170 Estrogen receptor positive status [ER+]: Secondary | ICD-10-CM | POA: Diagnosis not present

## 2017-03-27 DIAGNOSIS — C50919 Malignant neoplasm of unspecified site of unspecified female breast: Secondary | ICD-10-CM | POA: Diagnosis not present

## 2017-03-29 ENCOUNTER — Encounter: Payer: Self-pay | Admitting: Internal Medicine

## 2017-03-29 ENCOUNTER — Ambulatory Visit (INDEPENDENT_AMBULATORY_CARE_PROVIDER_SITE_OTHER): Payer: Medicare Other | Admitting: Internal Medicine

## 2017-03-29 VITALS — BP 112/72 | HR 82 | Wt 213.0 lb

## 2017-03-29 DIAGNOSIS — J4520 Mild intermittent asthma, uncomplicated: Secondary | ICD-10-CM

## 2017-03-29 NOTE — Progress Notes (Signed)
[facility] Consultation      Date: 03/29/2017,   MRN# [id number] Areebah Meinders [facility] 1961/12/11 Code Status:  Code Status History    This patient does not have a recorded code status. Please follow your organizational policy for patients in this situation.     Hosp day:@LENGTHOFSTAYDAYS @ Referring MD: @ATDPROV @     PCP:      Admission                  Current  Janet Austin is a 56 y.o. old female seen in consultation for nodules at the request of Dr. Humphrey Rolls.     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 Anxiety   . Arthritis   . Cancer ([place])    left breast  . Headache    migraines  . PONV (postoperative nausea and vomiting)      SURGICAL HISTORY   Past Surgical History:  Procedure Laterality Date  . ABDOMINAL HYSTERECTOMY    . BREAST SURGERY     double mastectomy  . BREAST SURGERY     reconstruction  . COLONOSCOPY WITH PROPOFOL N/A 05/29/2016   Procedure: COLONOSCOPY WITH PROPOFOL;  Surgeon: Lollie Sails, MD;  Location: [facility] ENDOSCOPY;  Service: Endoscopy;  Laterality: N/A;  . KNEE  ARTHROSCOPY Right   . MASTECTOMY       FAMILY HISTORY  No family history of breast cancer No family ho  SOCIAL HISTORY   Social History  Substance Use Topics  . Smoking status: Never Smoker  . Smokeless tobacco: Not on file  . Alcohol use No     MEDICATIONS    Home Medication:  Current Outpatient Rx  . Order #: [id number] Class: Historical Med  . Order #: [id number] Class: Historical Med  . Order #: [id number] Class: Historical Med  . Order #: [id number] Class: Historical Med  . Order #: [id number] Class: Historical Med  . Order #: [id number] Class: Historical Med  . Order #: [id number] Class: Historical Med  . Order #: [id number] Class: Historical Med  . Order #: [id number] Class: Historical Med    Current Medication:  Current Outpatient Prescriptions:  .  albuterol (PROVENTIL HFA;VENTOLIN HFA) 108 (90 Base) MCG/ACT inhaler, Inhale into the lungs every 6 (six) hours as needed for wheezing or shortness of breath., Disp: , Rfl:  .  ALPRAZolam (XANAX) 1 MG tablet, Take 1 mg by mouth at bedtime as needed for anxiety., Disp: , Rfl:  .  atorvastatin (LIPITOR) 10 MG tablet, Take 10 mg by mouth daily., Disp: , Rfl:  .  cyclobenzaprine (FLEXERIL) 5 MG  tablet, Take 5 mg by mouth 3 (three) times daily as needed for muscle spasms., Disp: , Rfl:  .  fluticasone (FLONASE) 50 MCG/ACT nasal spray, Place into both nostrils daily., Disp: , Rfl:  .  OxyCODONE HCl, Abuse Deter, (OXAYDO) 5 MG TABA, Take by mouth., Disp: , Rfl:  .  promethazine-codeine (PHENERGAN WITH CODEINE) 6.25-10 MG/5ML syrup, Take by mouth every 6 (six) hours as needed for cough., Disp: , Rfl:  .  valACYclovir (VALTREX) 1000 MG tablet, Take 1,000 mg by mouth 2 (two) times daily., Disp: , Rfl:  .  zolpidem (AMBIEN) 10 MG tablet, Take 10 mg by mouth at bedtime as needed for sleep., Disp: , Rfl:     ALLERGIES   Asa [aspirin]; Gabapentin; and Lyrica [pregabalin]     REVIEW OF SYSTEMS   Review of Systems  Constitutional:  Negative for chills, diaphoresis, fever, malaise/fatigue and weight loss.  HENT: Negative for congestion and hearing loss.   Eyes: Negative for blurred vision and double vision.  Respiratory: Positive for cough, shortness of breath and wheezing.   Cardiovascular: Negative for chest pain, palpitations and orthopnea.  Gastrointestinal: Negative for abdominal pain, blood in stool, constipation, diarrhea, heartburn, nausea and vomiting.  Genitourinary: Negative for dysuria and urgency.  Musculoskeletal: Negative for back pain, myalgias and neck pain.  Skin: Negative for rash.  Neurological: Negative for dizziness, tingling, tremors, weakness and headaches.  Endo/Heme/Allergies: Does not bruise/bleed easily.  Psychiatric/Behavioral: Negative for depression, substance abuse and suicidal ideas.  All other systems reviewed and are negative.   BP 112/72 (BP Location: Right Arm, Cuff Size: Normal)   Pulse 82   Wt 213 lb (96.6 kg)   SpO2 97%   BMI 36.56 kg/m     PHYSICAL EXAM  Physical Exam  Constitutional: She is oriented to person, place, and time. She appears well-developed and well-nourished. She appears distressed.  HENT:  Head: Normocephalic and atraumatic.  Mouth/Throat: No oropharyngeal exudate.  Eyes: EOM are normal. Pupils are equal, round, and reactive to light. No scleral icterus.  Neck: Normal range of motion. Neck supple.  Cardiovascular: Normal rate, regular rhythm and normal heart sounds.   No murmur heard. Pulmonary/Chest: No stridor. She is in respiratory distress. She has wheezes.  Abdominal: Soft. Bowel sounds are normal. She exhibits no distension. There is no tenderness. There is no rebound.  Musculoskeletal: Normal range of motion. She exhibits no edema.  Neurological: She is alert and oriented to person, place, and time. She displays normal reflexes. Coordination normal.  Skin: Skin is warm. She is diaphoretic.  Psychiatric: She has a normal mood and affect.             IMAGING   I have Independently reviewed images of  CXR 11/13/16    on 03/29/2017 Interpretation:no acute findings, no acute opacities     ASSESSMENT/PLAN   56 yo white female with previous Breast cancer with breast implants with previous dx of Pulmonary nodules that were noted in 08/2016 and repeat CT chest in 1/208 have resolved per report, with h/o bronchitis and cough several months ago with significant exposure to second hand smoke exposure with mild intermittent reactive air ways disease  1.obtain previous CT scan films from patient 2.check PFT's 3.albuterol as needed   Follow up after PFT's completed  Overall, patient has low risk for post op pulmonary complications  Preoperative Pulmonary Risk Assessment Patient is proposed for implant removal plastic surgery.  - ABG is unlikely to aid in pre-op evaluation  General Risk Reduction Strategies: - All patients warrant post-operative incentive spirometry. For those with obstruction, also consider flutter valve. - Early ambulation, PT/OT - DVT prophylaxis where appropriate - Adequate pain control without oversedation    Patient satisfied with Plan of action and management. All questions answered  Corrin Parker, M.D.  [facility]  Medical Director [place] Director [facility] Cardio-Pulmonary Department

## 2017-03-29 NOTE — Patient Instructions (Signed)
Please bring in CT scan films from 12/2016 continue ventolin as needed Check PFT's

## 2017-04-30 DIAGNOSIS — G894 Chronic pain syndrome: Secondary | ICD-10-CM | POA: Diagnosis not present

## 2017-06-27 DIAGNOSIS — L308 Other specified dermatitis: Secondary | ICD-10-CM | POA: Diagnosis not present

## 2017-06-27 DIAGNOSIS — D2272 Melanocytic nevi of left lower limb, including hip: Secondary | ICD-10-CM | POA: Diagnosis not present

## 2017-06-27 DIAGNOSIS — D225 Melanocytic nevi of trunk: Secondary | ICD-10-CM | POA: Diagnosis not present

## 2017-06-27 DIAGNOSIS — D2261 Melanocytic nevi of right upper limb, including shoulder: Secondary | ICD-10-CM | POA: Diagnosis not present

## 2017-06-27 DIAGNOSIS — C44519 Basal cell carcinoma of skin of other part of trunk: Secondary | ICD-10-CM | POA: Diagnosis not present

## 2017-07-04 ENCOUNTER — Ambulatory Visit: Payer: Medicare Other | Attending: Internal Medicine

## 2017-07-04 ENCOUNTER — Telehealth: Payer: Self-pay | Admitting: Internal Medicine

## 2017-07-04 DIAGNOSIS — J4520 Mild intermittent asthma, uncomplicated: Secondary | ICD-10-CM | POA: Diagnosis not present

## 2017-07-04 MED ORDER — ALBUTEROL SULFATE (2.5 MG/3ML) 0.083% IN NEBU
2.5000 mg | INHALATION_SOLUTION | Freq: Once | RESPIRATORY_TRACT | Status: AC
  Administered 2017-07-04: 2.5 mg via RESPIRATORY_TRACT
  Filled 2017-07-04: qty 3

## 2017-07-04 NOTE — Telephone Encounter (Signed)
Disk picked up and placed in provider folder (DK).

## 2017-07-04 NOTE — Telephone Encounter (Signed)
Patient dropped off cd of recent images.  Placed in nurse box

## 2017-07-11 ENCOUNTER — Ambulatory Visit: Payer: Medicare Other

## 2017-07-12 ENCOUNTER — Ambulatory Visit (INDEPENDENT_AMBULATORY_CARE_PROVIDER_SITE_OTHER): Payer: Medicare Other | Admitting: Internal Medicine

## 2017-07-12 ENCOUNTER — Encounter: Payer: Self-pay | Admitting: Internal Medicine

## 2017-07-12 VITALS — BP 112/82 | HR 75 | Resp 16 | Ht 64.0 in | Wt 204.0 lb

## 2017-07-12 DIAGNOSIS — J4520 Mild intermittent asthma, uncomplicated: Secondary | ICD-10-CM | POA: Diagnosis not present

## 2017-07-12 NOTE — Progress Notes (Signed)
[facility] Consultation      Date: 07/12/2017,   MRN# [id number] Janet Austin [facility] Jun 30, 1961 Code Status:  Code Status History    This patient does not have a recorded code status. Please follow your organizational policy for patients in this situation.     Hosp day:@LENGTHOFSTAYDAYS @ Referring MD: @ATDPROV @     PCP:      Admission                  Current  Janet Austin is a 56 y.o. old female seen in consultation for nodules at the request of Dr. Humphrey Rolls.     CHIEF COMPLAINT:   abnormal CT chest   HISTORY OF PRESENT ILLNESS  56 yo pleasant white female seen today for abnormal report of pulmonary nodules Review of CT scan shows resolution of these pulmonary nodules in January 2018  Patient has had previous Breast cancer s/p implant surgery in 2010 Patient has CT chest report in 92017 that shows b/l pulmonary nodules A repeat CT chest in 12/2016 shows resolution of the nodules  Patient has not had any acute wheezing or coughing episodes and no shortness of breath no chest pain no palpitations at this time    She has no acute issues now and has intermittent cough No signs of infection at this time  CT chest images reviewed today There are no significant nodules noted on the CT chest from January 2018 Case reviewed with patient  CT chest image   Current Medication:  Current Outpatient Prescriptions:  .  albuterol (PROVENTIL HFA;VENTOLIN HFA) 108 (90 Base) MCG/ACT inhaler, Inhale into the lungs every 6 (six) hours as needed for wheezing or shortness of breath., Disp: , Rfl:  .  ALPRAZolam (XANAX) 1 MG tablet, Take 1 mg by mouth at bedtime as needed for anxiety., Disp: , Rfl:  .  atorvastatin (LIPITOR) 10 MG tablet, Take 10 mg by mouth daily., Disp: , Rfl:  .  cyclobenzaprine (FLEXERIL) 5 MG tablet, Take 5 mg by mouth 3 (three) times daily as needed for muscle spasms., Disp: , Rfl:  .  fluticasone (FLONASE) 50 MCG/ACT nasal spray, Place into both  nostrils daily., Disp: , Rfl:  .  OxyCODONE HCl, Abuse Deter, (OXAYDO) 5 MG TABA, Take by mouth., Disp: , Rfl:  .  promethazine-codeine (PHENERGAN WITH CODEINE) 6.25-10 MG/5ML syrup, Take by mouth every 6 (six) hours as needed for cough., Disp: , Rfl:  .  valACYclovir (VALTREX) 1000 MG tablet, Take 1,000 mg by mouth 2 (two) times daily., Disp: , Rfl:  .  zolpidem (AMBIEN) 10 MG tablet, Take 10 mg by mouth at bedtime as needed for sleep., Disp: , Rfl:     ALLERGIES   Asa [aspirin]; Gabapentin; and Lyrica [pregabalin]     REVIEW OF SYSTEMS   Review of Systems  Constitutional: Negative for chills, diaphoresis, fever, malaise/fatigue and weight loss.  HENT: Negative for congestion and hearing loss.   Respiratory: Negative for cough, hemoptysis, sputum production, shortness of breath and wheezing.   Cardiovascular: Negative for chest pain, palpitations and orthopnea.  Skin: Negative for rash.  Neurological: Negative for weakness.  Psychiatric/Behavioral: Negative for depression.  All other systems reviewed and are negative.   BP 112/82 (BP Location: Left Arm, Cuff Size: Normal)   Pulse 75   Resp 16   Ht 5\' 4"  (1.626 m)   Wt 204 lb (92.5 kg)   SpO2 96%   BMI 35.02 kg/m     PHYSICAL EXAM  Physical Exam  Constitutional: She is oriented to person, place, and time. She appears well-developed and well-nourished. No distress.  Eyes: EOM are normal.  Cardiovascular: Normal rate, regular rhythm and normal heart sounds.   No murmur heard. Pulmonary/Chest: No stridor. No respiratory distress. She has no wheezes.  Musculoskeletal: Normal range of motion. She exhibits no edema.  Neurological: She is alert and oriented to person, place, and time. No cranial nerve deficit.  Skin: Skin is warm. She is not diaphoretic.  Psychiatric: She has a normal mood and affect.      Pulmonary function test performed      IMAGING   I have Independently reviewed images of  CXR 11/13/16      Interpretation:no acute findings, no acute opacities  I have independently reviewed the CT scans of January 2018 Interpretation this is no acute pulmonary nodule seen no masses seen no opacifications   Pulmonary function testing on 07/04/17 Shows normal ratio with a normal FEV1 and normal FVC normal TLC normal RV and normal DLCO with correction of volumes Interpretation normal spirometry   Lemay function testing  Pulmonary function tests on 07/04/17 shows a normal ratio and normal FEV1 normal FVC normal TLC and normal DLCO with correction of volumes  ASSESSMENT/PLAN   56 yo white female with previous Breast cancer with breast implants with previous dx of Pulmonary nodules that were noted in 08/2016 and repeat CT chest in 1/208 have resolved per report, with h/o bronchitis and cough several months ago with significant exposure to second hand smoke exposure with mild intermittent reactive air ways disease  At this time her mild intermittent reactive airways disease is under control with albuterol as needed she probably has underlying asthma which is well controlled at this time   Overall, patient has low risk for post op pulmonary complications  Preoperative Pulmonary Risk Assessment Patient is proposed for implant removal plastic surgery.  - ABG is unlikely to aid in pre-op evaluation  General Risk Reduction Strategies: - All patients warrant post-operative incentive spirometry. For those with obstruction, also consider flutter valve. - Early ambulation, PT/OT - DVT prophylaxis where appropriate - Adequate pain control without oversedation    Patient satisfied with Plan of action and management. All questions answered Follow-up in 6 months   Suleika Donavan Patricia Pesa, M.D.  [facility]  Medical Director [place] Director [facility] Cardio-Pulmonary Department

## 2017-07-12 NOTE — Patient Instructions (Signed)
Continue albuterol as needed 

## 2017-07-23 DIAGNOSIS — B373 Candidiasis of vulva and vagina: Secondary | ICD-10-CM | POA: Diagnosis not present

## 2017-07-23 DIAGNOSIS — Z853 Personal history of malignant neoplasm of breast: Secondary | ICD-10-CM | POA: Diagnosis not present

## 2017-07-23 DIAGNOSIS — F411 Generalized anxiety disorder: Secondary | ICD-10-CM | POA: Diagnosis not present

## 2017-07-23 DIAGNOSIS — E782 Mixed hyperlipidemia: Secondary | ICD-10-CM | POA: Diagnosis not present

## 2017-08-14 DIAGNOSIS — C44519 Basal cell carcinoma of skin of other part of trunk: Secondary | ICD-10-CM | POA: Diagnosis not present

## 2017-08-14 DIAGNOSIS — L905 Scar conditions and fibrosis of skin: Secondary | ICD-10-CM | POA: Diagnosis not present

## 2017-11-18 DIAGNOSIS — T8549XA Other mechanical complication of breast prosthesis and implant, initial encounter: Secondary | ICD-10-CM | POA: Diagnosis not present

## 2017-11-18 DIAGNOSIS — Z4682 Encounter for fitting and adjustment of non-vascular catheter: Secondary | ICD-10-CM | POA: Diagnosis not present

## 2017-11-18 DIAGNOSIS — Z79891 Long term (current) use of opiate analgesic: Secondary | ICD-10-CM | POA: Diagnosis not present

## 2017-11-18 DIAGNOSIS — Z853 Personal history of malignant neoplasm of breast: Secondary | ICD-10-CM | POA: Diagnosis not present

## 2017-11-18 DIAGNOSIS — E785 Hyperlipidemia, unspecified: Secondary | ICD-10-CM | POA: Diagnosis not present

## 2017-11-18 DIAGNOSIS — Z79899 Other long term (current) drug therapy: Secondary | ICD-10-CM | POA: Diagnosis not present

## 2017-11-18 DIAGNOSIS — L905 Scar conditions and fibrosis of skin: Secondary | ICD-10-CM | POA: Diagnosis not present

## 2017-11-18 DIAGNOSIS — G620 Drug-induced polyneuropathy: Secondary | ICD-10-CM | POA: Diagnosis not present

## 2017-11-18 DIAGNOSIS — T451X5A Adverse effect of antineoplastic and immunosuppressive drugs, initial encounter: Secondary | ICD-10-CM | POA: Diagnosis not present

## 2017-11-18 DIAGNOSIS — T8544XA Capsular contracture of breast implant, initial encounter: Secondary | ICD-10-CM | POA: Diagnosis not present

## 2017-11-18 DIAGNOSIS — C50919 Malignant neoplasm of unspecified site of unspecified female breast: Secondary | ICD-10-CM | POA: Diagnosis not present

## 2017-11-18 DIAGNOSIS — Z9882 Breast implant status: Secondary | ICD-10-CM | POA: Diagnosis not present

## 2017-11-18 DIAGNOSIS — Z421 Encounter for breast reconstruction following mastectomy: Secondary | ICD-10-CM | POA: Diagnosis not present

## 2017-11-18 DIAGNOSIS — J9811 Atelectasis: Secondary | ICD-10-CM | POA: Diagnosis not present

## 2017-11-18 DIAGNOSIS — J45909 Unspecified asthma, uncomplicated: Secondary | ICD-10-CM | POA: Diagnosis not present

## 2017-11-18 DIAGNOSIS — T797XXA Traumatic subcutaneous emphysema, initial encounter: Secondary | ICD-10-CM | POA: Diagnosis not present

## 2017-11-19 DIAGNOSIS — T8549XA Other mechanical complication of breast prosthesis and implant, initial encounter: Secondary | ICD-10-CM | POA: Diagnosis not present

## 2017-11-19 DIAGNOSIS — E785 Hyperlipidemia, unspecified: Secondary | ICD-10-CM | POA: Diagnosis not present

## 2017-11-19 DIAGNOSIS — T451X5A Adverse effect of antineoplastic and immunosuppressive drugs, initial encounter: Secondary | ICD-10-CM | POA: Diagnosis not present

## 2017-11-19 DIAGNOSIS — J45909 Unspecified asthma, uncomplicated: Secondary | ICD-10-CM | POA: Diagnosis not present

## 2017-11-19 DIAGNOSIS — C50919 Malignant neoplasm of unspecified site of unspecified female breast: Secondary | ICD-10-CM | POA: Diagnosis not present

## 2017-11-19 DIAGNOSIS — G620 Drug-induced polyneuropathy: Secondary | ICD-10-CM | POA: Diagnosis not present

## 2017-11-19 DIAGNOSIS — Z79891 Long term (current) use of opiate analgesic: Secondary | ICD-10-CM | POA: Diagnosis not present

## 2017-11-19 DIAGNOSIS — Z79899 Other long term (current) drug therapy: Secondary | ICD-10-CM | POA: Diagnosis not present

## 2017-11-19 DIAGNOSIS — L905 Scar conditions and fibrosis of skin: Secondary | ICD-10-CM | POA: Diagnosis not present

## 2018-01-27 IMAGING — CR DG CHEST 2V
1 series · 2 of 2 positions shown · non-contrast
Comparison: January 12, 2011

CLINICAL DATA: Cough and congestion.  History of breast carcinoma

EXAM:
CHEST  2 VIEW

[Series 1: dg chest 2 view · 0.14mm/px · 2 of 2 slices shown]
[im 1/2]
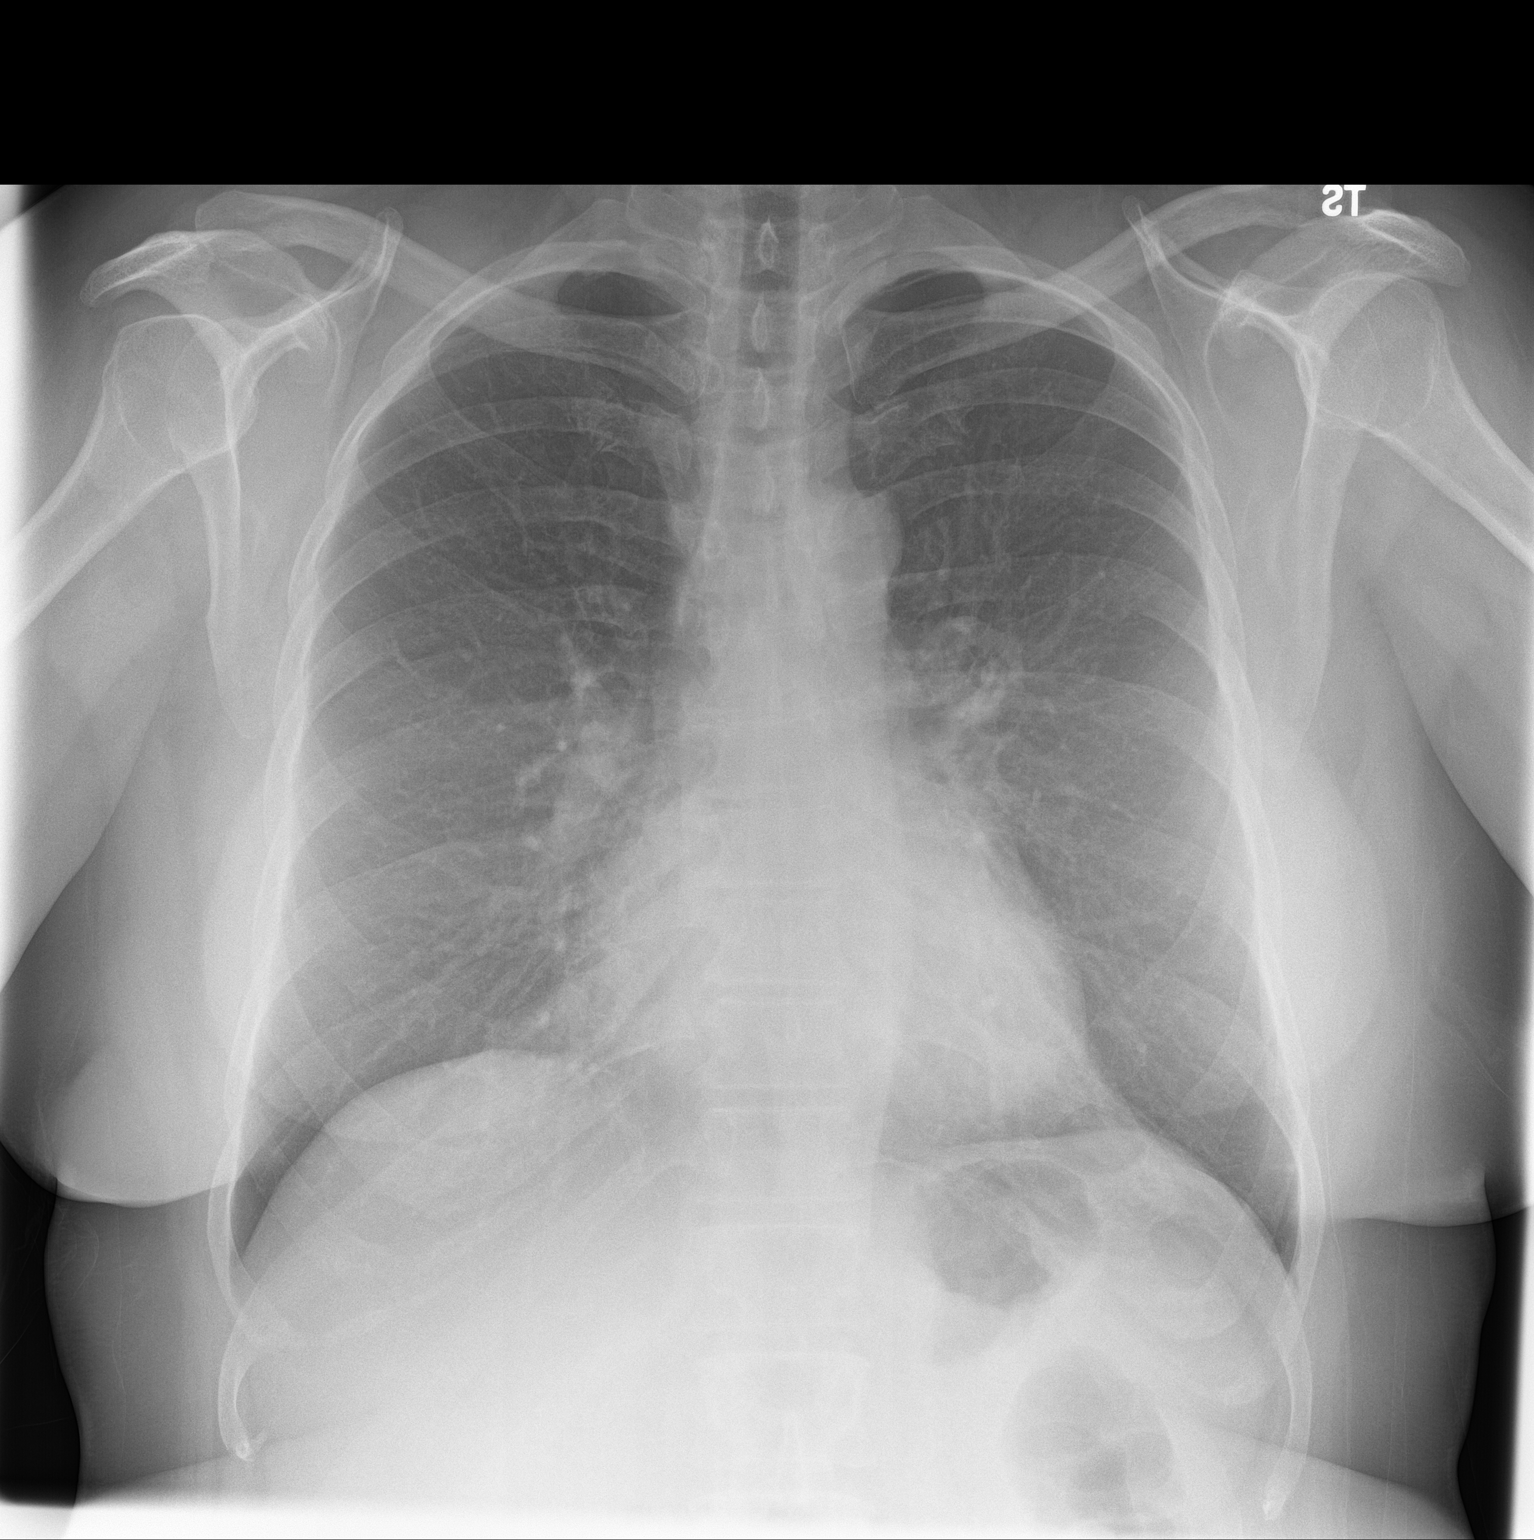
[im 2/2]
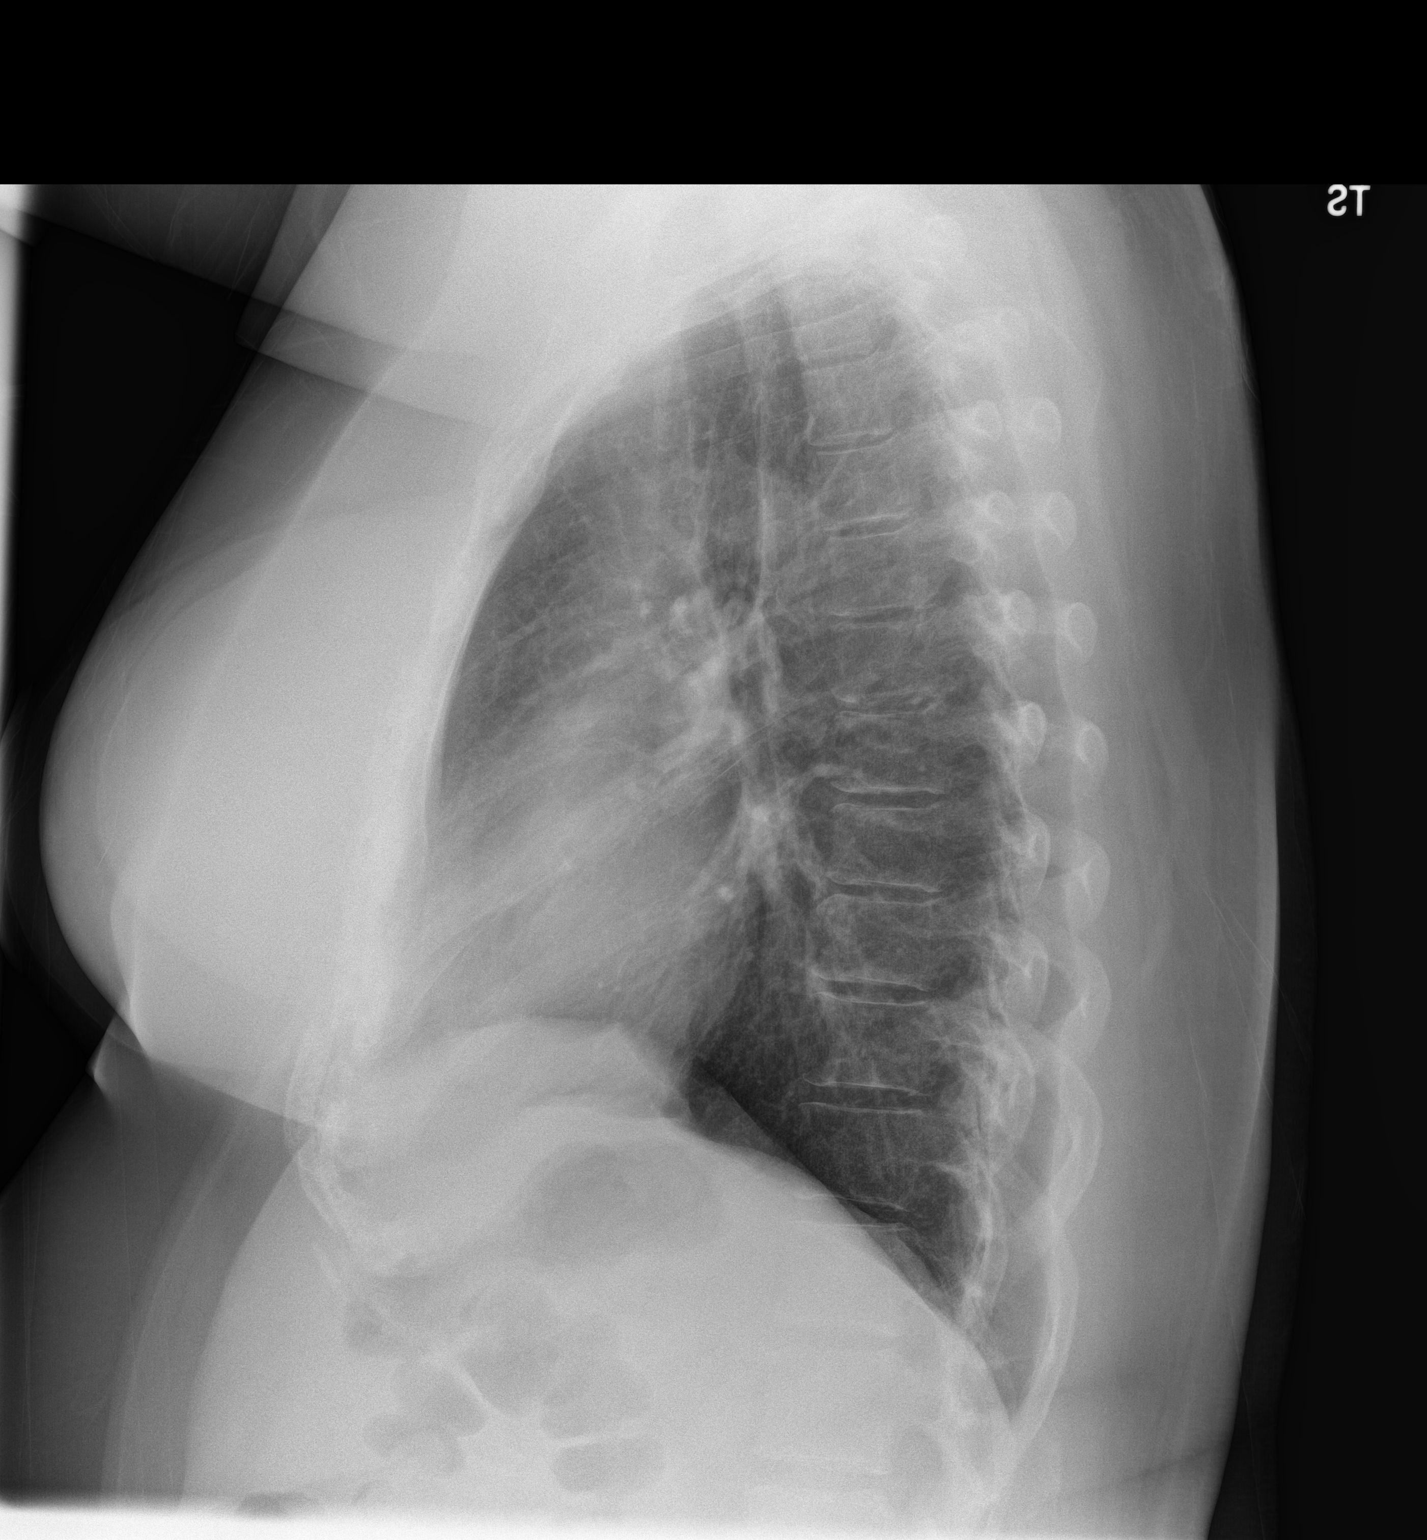

[2 of 2 positions shown; findings below may reference images not displayed]

FINDINGS: There is no edema or consolidation. The heart size and pulmonary
vascularity are normal. No adenopathy. No bone lesions.
IMPRESSION: No edema or consolidation.

## 2018-01-28 ENCOUNTER — Encounter: Payer: Self-pay | Admitting: Nurse Practitioner

## 2018-02-21 ENCOUNTER — Encounter: Payer: Self-pay | Admitting: Internal Medicine

## 2018-02-21 ENCOUNTER — Ambulatory Visit: Payer: Medicare Other | Admitting: Internal Medicine

## 2018-02-21 VITALS — BP 126/78 | HR 95 | Ht 64.0 in | Wt 216.0 lb

## 2018-02-21 DIAGNOSIS — G4719 Other hypersomnia: Secondary | ICD-10-CM | POA: Diagnosis not present

## 2018-02-21 DIAGNOSIS — J4520 Mild intermittent asthma, uncomplicated: Secondary | ICD-10-CM

## 2018-02-21 NOTE — Patient Instructions (Signed)
Albuterol as needed Obtain sleep study

## 2018-02-21 NOTE — Progress Notes (Signed)
[facility] Consultation      Date: 02/21/2018,   MRN# [id number] Janet Austin [facility] 01-06-1961    AdmissionWeight: 216 lb (98 kg)                 CurrentWeight: 216 lb (98 kg) Janet Austin is a 57 y.o. old female seen in consultation for nodules at the request of Dr. Humphrey Rolls.     CHIEF COMPLAINT:   Follow up ASTHMA   HISTORY OF PRESENT ILLNESS  57 yo pleasant white female seen today for abnormal report of pulmonary nodules Review of CT scan shows resolution of these pulmonary nodules in January 2018  Patient has had previous Breast cancer s/p implant surgery in 2010 Patient has CT chest report in 92017 that shows b/l pulmonary nodules A repeat CT chest in 12/2016 shows resolution of the nodules  Patient has not had any acute wheezing or coughing episodes and no shortness of breath no chest pain no palpitations at this time    She has no acute issues now and has intermittent cough No signs of infection at this time  CT chest images reviewed today There are no significant nodules noted on the CT chest from January 2018 Case reviewed with patient  Patient had breast implants removed several monts ago and this has changed her breathing completely She feels so much better It seems that the implants were causing constrictive disease and she has felt really good with her breathing  Patient  has been having sleep problems for over one year Patient has been having excessive daytime sleepiness Patient has been having extreme fatigue and tiredness, lack of energy  EPWORTH sleep score 14  Patient h CT chest image   Current Medication:  Current Outpatient Medications:  .  albuterol (PROVENTIL HFA;VENTOLIN HFA) 108 (90 Base) MCG/ACT inhaler, Inhale into the lungs every 6 (six) hours as needed for wheezing or shortness of breath., Disp: , Rfl:  .  ALPRAZolam (XANAX) 1 MG tablet, Take 1 mg by mouth at bedtime as needed for anxiety., Disp: , Rfl:  .  atorvastatin  (LIPITOR) 10 MG tablet, Take 10 mg by mouth daily., Disp: , Rfl:  .  clobetasol ointment (TEMOVATE) 6.16 %, Apply 1 application topically daily as needed., Disp: , Rfl: 0 .  cyclobenzaprine (FLEXERIL) 5 MG tablet, Take 5 mg by mouth 3 (three) times daily as needed for muscle spasms., Disp: , Rfl:  .  OxyCODONE HCl, Abuse Deter, (OXAYDO) 5 MG TABA, Take by mouth., Disp: , Rfl:  .  valACYclovir (VALTREX) 1000 MG tablet, Take 1,000 mg by mouth 2 (two) times daily., Disp: , Rfl:  .  zolpidem (AMBIEN) 10 MG tablet, Take 10 mg by mouth at bedtime as needed for sleep., Disp: , Rfl:     ALLERGIES   Asa [aspirin]; Gabapentin; Lyrica [pregabalin]; and Tape     REVIEW OF SYSTEMS   Review of Systems  Constitutional: Negative for chills, diaphoresis, fever, malaise/fatigue and weight loss.  HENT: Negative for congestion and hearing loss.   Respiratory: Negative for cough, hemoptysis, sputum production, shortness of breath and wheezing.   Cardiovascular: Negative for chest pain, palpitations and orthopnea.  Skin: Negative for rash.  Neurological: Negative for weakness.  Psychiatric/Behavioral: Negative for depression.  All other systems reviewed and are negative.   BP 126/78 (BP Location: Right Arm, Cuff Size: Large)   Pulse 95   Ht 5\' 4"  (1.626 m)   Wt 216 lb (98 kg)   SpO2  95%   BMI 37.08 kg/m     PHYSICAL EXAM  Physical Exam  Constitutional: She is oriented to person, place, and time. She appears well-developed and well-nourished. No distress.  Eyes: EOM are normal.  Cardiovascular: Normal rate, regular rhythm and normal heart sounds.  No murmur heard. Pulmonary/Chest: No stridor. No respiratory distress. She has no wheezes.  Musculoskeletal: Normal range of motion. She exhibits no edema.  Neurological: She is alert and oriented to person, place, and time. No cranial nerve deficit.  Skin: Skin is warm. She is not diaphoretic.  Psychiatric: She has a normal mood and affect.       Pulmonary function test performed      IMAGING  I have Independently reviewed images of  CXR 11/13/16    Interpretation:no acute findings, no acute opacities  I have independently reviewed the CT scans of January 2018 Interpretation this is no acute pulmonary nodule seen no masses seen no opacifications   Pulmonary function testing on 07/04/17 Shows normal ratio with a normal FEV1 and normal FVC normal TLC normal RV and normal DLCO with correction of volumes Interpretation normal spirometry   Lemay function testing ection of volumes  ASSESSMENT/PLAN   57 yo white female with previous Breast cancer with breast implants with previous dx of Pulmonary nodules that were noted in 08/2016 and repeat CT chest in 1/208 have resolved per report, with h/o bronchitis and cough several months ago with significant exposure to second hand smoke exposure with mild intermittent reactive air ways disease Since her breast implants have been removed-she has much improvement with her resp status and breathing  ASTHMA At this time her mild intermittent reactive airways disease is under control with albuterol as needed she probably has underlying asthma which is well controlled at this time  SLEEP APNEA Patient has signs and symptoms of sleep apnea with EPWORTH sleep score 14 Plan for sleep study Follow up after results    GEN SURGERY PRO ASSESSMENTS  Overall, patient has low risk for post op pulmonary complications  Preoperative Pulmonary Risk Assessment Patient is proposed for implant removal plastic surgery.  - ABG is unlikely to aid in pre-op evaluation  General Risk Reduction Strategies: - All patients warrant post-operative incentive spirometry. For those with obstruction, also consider flutter valve. - Early ambulation, PT/OT - DVT prophylaxis where appropriate - Adequate pain control without oversedation    Patient satisfied with Plan of action and management. All questions  answered Follow-up in 6 months   Ortha Metts Patricia Pesa, M.D.  [facility]  Medical Director [place] Director [facility] Cardio-Pulmonary Department           Pulmonary function tests on 07/04/17 shows a normal ratio and normal FEV1 normal FVC normal TLC and normal DLCO with corr

## 2018-02-26 DIAGNOSIS — G8928 Other chronic postprocedural pain: Secondary | ICD-10-CM | POA: Diagnosis not present

## 2018-02-26 DIAGNOSIS — Z79891 Long term (current) use of opiate analgesic: Secondary | ICD-10-CM | POA: Diagnosis not present

## 2018-02-26 DIAGNOSIS — G894 Chronic pain syndrome: Secondary | ICD-10-CM | POA: Diagnosis not present

## 2018-02-26 DIAGNOSIS — M792 Neuralgia and neuritis, unspecified: Secondary | ICD-10-CM | POA: Diagnosis not present

## 2018-03-04 ENCOUNTER — Telehealth: Payer: Self-pay | Admitting: Internal Medicine

## 2018-03-04 NOTE — Telephone Encounter (Signed)
Pt calling stating she is not wanting to do sleep study anymore   She states that she has so many other health issues at the moment.    FYI

## 2018-03-04 NOTE — Telephone Encounter (Signed)
ok 

## 2018-03-10 ENCOUNTER — Encounter: Payer: Self-pay | Admitting: Nurse Practitioner

## 2018-03-10 ENCOUNTER — Ambulatory Visit: Payer: Medicare Other | Admitting: Nurse Practitioner

## 2018-03-10 VITALS — BP 122/82 | HR 76 | Ht 64.0 in | Wt 216.0 lb

## 2018-03-10 DIAGNOSIS — F411 Generalized anxiety disorder: Secondary | ICD-10-CM | POA: Diagnosis not present

## 2018-03-10 DIAGNOSIS — F5101 Primary insomnia: Secondary | ICD-10-CM

## 2018-03-10 DIAGNOSIS — R300 Dysuria: Secondary | ICD-10-CM | POA: Diagnosis not present

## 2018-03-10 DIAGNOSIS — Z0001 Encounter for general adult medical examination with abnormal findings: Secondary | ICD-10-CM

## 2018-03-10 DIAGNOSIS — C50919 Malignant neoplasm of unspecified site of unspecified female breast: Secondary | ICD-10-CM

## 2018-03-10 MED ORDER — ZOLPIDEM TARTRATE 10 MG PO TABS: 10.0000 mg | ORAL_TABLET | Freq: Every evening | ORAL | 3 refills | Status: DC | PRN

## 2018-03-10 MED ORDER — ALPRAZOLAM 1 MG PO TABS: 1.0000 mg | ORAL_TABLET | Freq: Every evening | ORAL | 3 refills | Status: DC | PRN

## 2018-03-10 NOTE — Progress Notes (Signed)
[facility] [place], [place] [address]  Internal MEDICINE  Office Visit Note  Patient Name: Janet Austin  [id number]  [id number]  Date of Service: 03/11/2018  Chief Complaint  Patient presents with  . Insomnia    uses ambien on most niths to help her sleep.      The patient is here for health maintenance exam. Since her most recent visit, she has had breast implants removed. Her right breast implant had ruptured several years before. It became encapsulated. Portions of the implant were adhered to her rib cage, her thymus gland, and her esophagus. She does have a large, horizontal scar, which nearly, entirely circumscribes her thorax. The scar is healing well. It is not tender. She states that she had very little post operative pain. She is working with her pain management provider to cut down her pain medication dosing.  She does still suffer from intermittent anxiety and difficulty sleeping. She takes alprazolam 1mg  most days. Will split this in two and take two smaller doses during the day, which does help to control any acute anxiety. She also takes Ambien at night to help her sleep. She needs refills for both of these medications today.  Pt is here for routine health maintenance examination  Current Medication: Outpatient Encounter Medications as of 03/10/2018  Medication Sig  . albuterol (PROVENTIL HFA;VENTOLIN HFA) 108 (90 Base) MCG/ACT inhaler Inhale into the lungs every 6 (six) hours as needed for wheezing or shortness of breath.  . ALPRAZolam (XANAX) 1 MG tablet Take 1 tablet (1 mg total) by mouth at bedtime as needed for anxiety.  Marland Kitchen atorvastatin (LIPITOR) 10 MG tablet Take 10 mg by mouth daily.  . clobetasol ointment (TEMOVATE) 7.89 % Apply 1 application topically daily as needed.  . cyclobenzaprine (FLEXERIL) 5 MG tablet Take 5 mg by mouth 3 (three) times daily as needed for muscle spasms.  . OxyCODONE HCl, Abuse Deter, (OXAYDO) 5 MG TABA Take by  mouth.  . valACYclovir (VALTREX) 1000 MG tablet Take 1,000 mg by mouth 2 (two) times daily.  Marland Kitchen zolpidem (AMBIEN) 10 MG tablet Take 1 tablet (10 mg total) by mouth at bedtime as needed for sleep.  . [DISCONTINUED] ALPRAZolam (XANAX) 1 MG tablet Take 1 mg by mouth at bedtime as needed for anxiety.  . [DISCONTINUED] zolpidem (AMBIEN) 10 MG tablet Take 10 mg by mouth at bedtime as needed for sleep.   No facility-administered encounter medications on file as of 03/10/2018.     Surgical History: Past Surgical History:  Procedure Laterality Date  . ABDOMINAL HYSTERECTOMY    . BREAST SURGERY     double mastectomy  . BREAST SURGERY     reconstruction  . COLONOSCOPY WITH PROPOFOL N/A 05/29/2016   Procedure: COLONOSCOPY WITH PROPOFOL;  Surgeon: Lollie Sails, MD;  [facility] ENDOSCOPY;  Service: Endoscopy;  Laterality: N/A;  . KNEE ARTHROSCOPY Right   . MASTECTOMY      Medical History: Past Medical History:  Diagnosis Date  . Anxiety   . Arthritis   . Cancer ([place])    left breast  . Headache    migraines  . PONV (postoperative nausea and vomiting)     Family History: No family history on file.    Review of Systems  Constitutional: Negative for activity change, chills, fatigue and unexpected weight change.  HENT: Negative for congestion, postnasal drip, rhinorrhea, sneezing and sore throat.   Eyes: Negative.  Negative for redness.  Respiratory: Negative for  cough, chest tightness, shortness of breath and wheezing.   Cardiovascular: Negative for chest pain and palpitations.  Gastrointestinal: Negative for abdominal pain, constipation, diarrhea, nausea and vomiting.  Endocrine: Negative for cold intolerance, heat intolerance, polydipsia, polyphagia and polyuria.  Genitourinary: Negative.  Negative for dysuria and frequency.  Musculoskeletal: Negative for arthralgias, back pain, joint swelling and neck pain.  Skin: Negative for rash.  Allergic/Immunologic: Positive for  environmental allergies.  Neurological: Negative for dizziness, tremors, numbness and headaches.  Hematological: Negative for adenopathy. Does not bruise/bleed easily.  Psychiatric/Behavioral: Positive for sleep disturbance. Negative for behavioral problems (Depression) and suicidal ideas. The patient is nervous/anxious.      Today's Vitals   03/10/18 1139  BP: 122/82  Pulse: 76  SpO2: 99%  Weight: 216 lb (98 kg)  Height: 5\' 4"  (1.626 m)    Physical Exam  Constitutional: She is oriented to person, place, and time. She appears well-developed and well-nourished. No distress.  HENT:  Head: Normocephalic and atraumatic.  Mouth/Throat: Oropharynx is clear and moist. No oropharyngeal exudate.  Eyes: Pupils are equal, round, and reactive to light. EOM are normal.  Neck: Normal range of motion. Neck supple. No JVD present. Carotid bruit is not present. No tracheal deviation present. No thyromegaly present.  Cardiovascular: Normal rate, regular rhythm, normal heart sounds and intact distal pulses. Exam reveals no gallop and no friction rub.  No murmur heard. Pulmonary/Chest: Effort normal and breath sounds normal. No respiratory distress. She has no wheezes. She has no rales. She exhibits no tenderness.    There is horizontal scar across the thorax, stretching to the posterior aspects of both shoulder blades. This scar is non tender and is well healed. She continues to wear a compression bra with breast prosthesis.   Abdominal: Soft. Bowel sounds are normal.  Musculoskeletal: Normal range of motion.  Neurological: She is alert and oriented to person, place, and time. No cranial nerve deficit.  Skin: Skin is warm and dry. She is not diaphoretic.  Psychiatric: She has a normal mood and affect. Her behavior is normal. Judgment and thought content normal.  Nursing note and vitals reviewed.   Assessment/Plan: 1. Encounter for general adult medical examination with abnormal findings Annual  wellness visit today  2. Generalized anxiety disorder Improving, overall. May continue alprazlam 1mg  QD as needed. New rx sent to her pharmacy.  - ALPRAZolam Duanne Moron) 1 MG tablet; Take 1 tablet (1 mg total) by mouth at bedtime as needed for anxiety.  Dispense: 30 tablet; Refill: 3  3. Primary insomnia May continue zolpidem 10mg  at night as needed. New prescription sent to her pharmacy.  - zolpidem (AMBIEN) 10 MG tablet; Take 1 tablet (10 mg total) by mouth at bedtime as needed for sleep.  Dispense: 30 tablet; Refill: 3  4. Malignant neoplasm of female breast, unspecified estrogen receptor status, unspecified laterality, unspecified site of breast ([place]) Recently had removal of bilateral breast implants. Will continue to be followed by her surgeon. Will be fitted for prosthesis after she is completely healed from surgery.   5. Dysuria - Urinalysis, Routine w reflex microscopic  General Counseling: Dejanique verbalizes understanding of the findings of todays visit and agrees with plan of treatment. I have discussed any further diagnostic evaluation that may be needed or ordered today. We also reviewed her medications today. she has been encouraged to call the office with any questions or concerns that should arise related to todays visit.   This patient was seen by Leretha Pol, FNP- C  in Collaboration with Dr Lavera Guise as a part of collaborative care agreement    Orders Placed This Encounter  Procedures  . Urinalysis, Routine w reflex microscopic    Meds ordered this encounter  Medications  . ALPRAZolam (XANAX) 1 MG tablet    Sig: Take 1 tablet (1 mg total) by mouth at bedtime as needed for anxiety.    Dispense:  30 tablet    Refill:  3    Order Specific Question:   Supervising Provider    Answer:   Lavera Guise [[id number]]  . zolpidem (AMBIEN) 10 MG tablet    Sig: Take 1 tablet (10 mg total) by mouth at bedtime as needed for sleep.    Dispense:  30 tablet    Refill:  3    Order  Specific Question:   Supervising Provider    Answer:   Lavera Guise [[id number]]    Time spent: [place], MD  Internal Medicine

## 2018-03-11 DIAGNOSIS — Z0001 Encounter for general adult medical examination with abnormal findings: Secondary | ICD-10-CM | POA: Insufficient documentation

## 2018-03-11 DIAGNOSIS — F411 Generalized anxiety disorder: Secondary | ICD-10-CM | POA: Insufficient documentation

## 2018-03-11 DIAGNOSIS — C50919 Malignant neoplasm of unspecified site of unspecified female breast: Secondary | ICD-10-CM | POA: Insufficient documentation

## 2018-03-11 DIAGNOSIS — R300 Dysuria: Secondary | ICD-10-CM | POA: Insufficient documentation

## 2018-03-11 DIAGNOSIS — F5101 Primary insomnia: Secondary | ICD-10-CM | POA: Insufficient documentation

## 2018-03-11 LAB — URINALYSIS, ROUTINE W REFLEX MICROSCOPIC
BILIRUBIN UA: NEGATIVE
Glucose, UA: NEGATIVE
Ketones, UA: NEGATIVE
Leukocytes, UA: NEGATIVE
NITRITE UA: NEGATIVE
PH UA: 5 (ref 5.0–7.5)
PROTEIN UA: NEGATIVE
RBC UA: NEGATIVE
Specific Gravity, UA: 1.02 (ref 1.005–1.030)
UUROB: 0.2 mg/dL (ref 0.2–1.0)

## 2018-04-02 DIAGNOSIS — Z9011 Acquired absence of right breast and nipple: Secondary | ICD-10-CM | POA: Diagnosis not present

## 2018-04-02 DIAGNOSIS — C50912 Malignant neoplasm of unspecified site of left female breast: Secondary | ICD-10-CM | POA: Diagnosis not present

## 2018-04-21 DIAGNOSIS — Z9011 Acquired absence of right breast and nipple: Secondary | ICD-10-CM | POA: Diagnosis not present

## 2018-04-21 DIAGNOSIS — C50912 Malignant neoplasm of unspecified site of left female breast: Secondary | ICD-10-CM | POA: Diagnosis not present

## 2018-05-06 DIAGNOSIS — C50912 Malignant neoplasm of unspecified site of left female breast: Secondary | ICD-10-CM | POA: Diagnosis not present

## 2018-05-06 DIAGNOSIS — Z9011 Acquired absence of right breast and nipple: Secondary | ICD-10-CM | POA: Diagnosis not present

## 2018-07-08 ENCOUNTER — Encounter: Payer: Self-pay | Admitting: Nurse Practitioner

## 2018-07-08 ENCOUNTER — Ambulatory Visit: Payer: Medicare Other | Admitting: Nurse Practitioner

## 2018-07-08 VITALS — BP 127/81 | HR 86 | Resp 16 | Ht 64.0 in | Wt 220.4 lb

## 2018-07-08 DIAGNOSIS — B001 Herpesviral vesicular dermatitis: Secondary | ICD-10-CM | POA: Diagnosis not present

## 2018-07-08 DIAGNOSIS — E782 Mixed hyperlipidemia: Secondary | ICD-10-CM

## 2018-07-08 DIAGNOSIS — F411 Generalized anxiety disorder: Secondary | ICD-10-CM | POA: Diagnosis not present

## 2018-07-08 DIAGNOSIS — F5101 Primary insomnia: Secondary | ICD-10-CM

## 2018-07-08 MED ORDER — VALACYCLOVIR HCL 1 G PO TABS: 1000.0000 mg | ORAL_TABLET | Freq: Two times a day (BID) | ORAL | 1 refills | Status: DC

## 2018-07-08 MED ORDER — ALPRAZOLAM 1 MG PO TABS: 1.0000 mg | ORAL_TABLET | Freq: Every evening | ORAL | 1 refills | Status: DC | PRN

## 2018-07-08 MED ORDER — ZOLPIDEM TARTRATE 10 MG PO TABS: 10.0000 mg | ORAL_TABLET | Freq: Every evening | ORAL | 1 refills | Status: DC | PRN

## 2018-07-08 MED ORDER — ATORVASTATIN CALCIUM 10 MG PO TABS: 10.0000 mg | ORAL_TABLET | Freq: Every day | ORAL | 4 refills | Status: DC

## 2018-07-08 NOTE — Progress Notes (Signed)
[facility] [place], [place] [address]  Internal MEDICINE  Office Visit Note  Patient Name: Janet Austin  [id number]  [id number]  Date of Service: 07/27/2018  Chief Complaint  Patient presents with  . Follow-up    4 month follow up.   . Anxiety    She does still suffer from intermittent anxiety and difficulty sleeping. She takes alprazolam 1mg  most days. Will split this in two and take two smaller doses during the day, which does help to control any acute anxiety. She also takes Ambien at night to help her sleep. She needs refills for both of these medications today. Continues to do very well after having her breast implants removed. Has little fatigue. Joint pain is improving still.       Current Medication: Outpatient Encounter Medications as of 07/08/2018  Medication Sig  . albuterol (PROVENTIL HFA;VENTOLIN HFA) 108 (90 Base) MCG/ACT inhaler Inhale into the lungs every 6 (six) hours as needed for wheezing or shortness of breath.  . ALPRAZolam (XANAX) 1 MG tablet Take 1 tablet (1 mg total) by mouth at bedtime as needed for anxiety.  Marland Kitchen atorvastatin (LIPITOR) 10 MG tablet Take 1 tablet (10 mg total) by mouth daily.  . clobetasol ointment (TEMOVATE) 4.09 % Apply 1 application topically daily as needed.  . cyclobenzaprine (FLEXERIL) 5 MG tablet Take 5 mg by mouth 3 (three) times daily as needed for muscle spasms.  . OxyCODONE HCl, Abuse Deter, (OXAYDO) 5 MG TABA Take by mouth.  . valACYclovir (VALTREX) 1000 MG tablet Take 1 tablet (1,000 mg total) by mouth 2 (two) times daily.  Marland Kitchen zolpidem (AMBIEN) 10 MG tablet Take 1 tablet (10 mg total) by mouth at bedtime as needed for sleep.  . [DISCONTINUED] ALPRAZolam (XANAX) 1 MG tablet Take 1 tablet (1 mg total) by mouth at bedtime as needed for anxiety.  . [DISCONTINUED] atorvastatin (LIPITOR) 10 MG tablet Take 10 mg by mouth daily.  . [DISCONTINUED] valACYclovir (VALTREX) 1000 MG tablet Take 1,000 mg by mouth 2  (two) times daily.  . [DISCONTINUED] zolpidem (AMBIEN) 10 MG tablet Take 1 tablet (10 mg total) by mouth at bedtime as needed for sleep.   No facility-administered encounter medications on file as of 07/08/2018.     Surgical History: Past Surgical History:  Procedure Laterality Date  . ABDOMINAL HYSTERECTOMY    . BREAST SURGERY     double mastectomy  . BREAST SURGERY     reconstruction  . COLONOSCOPY WITH PROPOFOL N/A 05/29/2016   Procedure: COLONOSCOPY WITH PROPOFOL;  Surgeon: Lollie Sails, MD;  Location: [facility] ENDOSCOPY;  Service: Endoscopy;  Laterality: N/A;  . KNEE ARTHROSCOPY Right   . MASTECTOMY      Medical History: Past Medical History:  Diagnosis Date  . Anxiety   . Arthritis   . Cancer ([place])    left breast  . Headache    migraines  . PONV (postoperative nausea and vomiting)     Family History: History reviewed. No pertinent family history.  Social History   Socioeconomic History  . Marital status: Married    Spouse name: Not on file  . Number of children: Not on file  . Years of education: Not on file  . Highest education level: Not on file  Occupational History  . Not on file  Social Needs  . Financial resource strain: Not on file  . Food insecurity:    Worry: Not on file    Inability: Not on file  .  Transportation needs:    Medical: Not on file    Non-medical: Not on file  Tobacco Use  . Smoking status: Never Smoker  . Smokeless tobacco: Never Used  Substance and Sexual Activity  . Alcohol use: No  . Drug use: No  . Sexual activity: Not on file  Lifestyle  . Physical activity:    Days per week: Not on file    Minutes per session: Not on file  . Stress: Not on file  Relationships  . Social connections:    Talks on phone: Not on file    Gets together: Not on file    Attends religious service: Not on file    Active member of club or organization: Not on file    Attends meetings of clubs or organizations: Not on file    Relationship  status: Not on file  . Intimate partner violence:    Fear of current or ex partner: Not on file    Emotionally abused: Not on file    Physically abused: Not on file    Forced sexual activity: Not on file  Other Topics Concern  . Not on file  Social History Narrative  . Not on file      Review of Systems  Constitutional: Negative for activity change, chills, fatigue and unexpected weight change.  HENT: Negative for congestion, postnasal drip, rhinorrhea, sneezing and sore throat.   Eyes: Negative.  Negative for redness.  Respiratory: Negative for cough, chest tightness, shortness of breath and wheezing.   Cardiovascular: Negative for chest pain and palpitations.  Gastrointestinal: Negative for abdominal pain, constipation, diarrhea, nausea and vomiting.  Endocrine: Negative for cold intolerance, heat intolerance, polydipsia, polyphagia and polyuria.  Genitourinary: Negative.  Negative for dysuria and frequency.  Musculoskeletal: Positive for arthralgias and myalgias. Negative for back pain, joint swelling and neck pain.  Skin: Negative for rash.  Allergic/Immunologic: Positive for environmental allergies.  Neurological: Negative for dizziness, tremors, numbness and headaches.  Hematological: Negative for adenopathy. Does not bruise/bleed easily.  Psychiatric/Behavioral: Positive for sleep disturbance. Negative for behavioral problems (Depression) and suicidal ideas. The patient is nervous/anxious.     Today's Vitals   07/08/18 1407  BP: 127/81  Pulse: 86  Resp: 16  SpO2: 95%  Weight: 220 lb 6.4 oz (100 kg)  Height: 5\' 4"  (1.626 m)    Physical Exam  Constitutional: She is oriented to person, place, and time. She appears well-developed and well-nourished. No distress.  HENT:  Head: Normocephalic and atraumatic.  Nose: Nose normal.  Mouth/Throat: Oropharynx is clear and moist. No oropharyngeal exudate.  Eyes: Pupils are equal, round, and reactive to light. Conjunctivae and  EOM are normal.  Neck: Normal range of motion. Neck supple. No JVD present. Carotid bruit is not present. No tracheal deviation present. No thyromegaly present.  Cardiovascular: Normal rate, regular rhythm and normal heart sounds. Exam reveals no gallop and no friction rub.  No murmur heard. Pulmonary/Chest: Effort normal and breath sounds normal. No respiratory distress. She has no wheezes. She has no rales. She exhibits no tenderness.  Abdominal: Soft. Bowel sounds are normal. There is no tenderness.  Musculoskeletal: Normal range of motion.  Lymphadenopathy:    She has no cervical adenopathy.  Neurological: She is alert and oriented to person, place, and time. No cranial nerve deficit.  Skin: Skin is warm and dry. Capillary refill takes less than 2 seconds. She is not diaphoretic.  Psychiatric: Her speech is normal and behavior is normal. Judgment and thought  content normal. Her mood appears anxious. Cognition and memory are normal. She exhibits a depressed mood.  Nursing note and vitals reviewed.  Assessment/Plan: 1. Mixed hyperlipidemia Continue atorvastatin as prescribed  - atorvastatin (LIPITOR) 10 MG tablet; Take 1 tablet (10 mg total) by mouth daily.  Dispense: 90 tablet; Refill: 4  2. Fever blister Take valacyclovir 1000mg  twice daily when needed for acute fever blister.  - valACYclovir (VALTREX) 1000 MG tablet; Take 1 tablet (1,000 mg total) by mouth 2 (two) times daily.  Dispense: 180 tablet; Refill: 1  3. Generalized anxiety disorder Ok to continue alprazolam 1mg  daily if needed for acute anxiety. New rx provided today. - ALPRAZolam (XANAX) 1 MG tablet; Take 1 tablet (1 mg total) by mouth at bedtime as needed for anxiety.  Dispense: 90 tablet; Refill: 1  4. Primary insomnia Lorrin Mais may be taken as needed for insomnia. New rx provided today. - zolpidem (AMBIEN) 10 MG tablet; Take 1 tablet (10 mg total) by mouth at bedtime as needed for sleep.  Dispense: 90 tablet; Refill:  1  General Counseling: Takiera verbalizes understanding of the findings of todays visit and agrees with plan of treatment. I have discussed any further diagnostic evaluation that may be needed or ordered today. We also reviewed her medications today. she has been encouraged to call the office with any questions or concerns that should arise related to todays visit.  This patient was seen by [place] with Dr Lavera Guise as a part of collaborative care agreement    Meds ordered this encounter  Medications  . ALPRAZolam (XANAX) 1 MG tablet    Sig: Take 1 tablet (1 mg total) by mouth at bedtime as needed for anxiety.    Dispense:  90 tablet    Refill:  1    This is 90 day prescription    Order Specific Question:   Supervising Provider    Answer:   Lavera Guise [[id number]]  . atorvastatin (LIPITOR) 10 MG tablet    Sig: Take 1 tablet (10 mg total) by mouth daily.    Dispense:  90 tablet    Refill:  4    Order Specific Question:   Supervising Provider    Answer:   Lavera Guise [[id number]]  . zolpidem (AMBIEN) 10 MG tablet    Sig: Take 1 tablet (10 mg total) by mouth at bedtime as needed for sleep.    Dispense:  90 tablet    Refill:  1    This is 90 day prescription    Order Specific Question:   Supervising Provider    Answer:   Lavera Guise [[id number]]  . valACYclovir (VALTREX) 1000 MG tablet    Sig: Take 1 tablet (1,000 mg total) by mouth 2 (two) times daily.    Dispense:  180 tablet    Refill:  1    Order Specific Question:   Supervising Provider    Answer:   Lavera Guise [[id number]]    Time spent: 30 Minutes      Dr Lavera Guise Internal medicine

## 2018-07-27 DIAGNOSIS — E1169 Type 2 diabetes mellitus with other specified complication: Secondary | ICD-10-CM | POA: Insufficient documentation

## 2018-07-27 DIAGNOSIS — B001 Herpesviral vesicular dermatitis: Secondary | ICD-10-CM | POA: Insufficient documentation

## 2018-07-27 DIAGNOSIS — E782 Mixed hyperlipidemia: Secondary | ICD-10-CM | POA: Insufficient documentation

## 2018-08-22 ENCOUNTER — Encounter: Payer: Self-pay | Admitting: Nurse Practitioner

## 2018-08-22 ENCOUNTER — Ambulatory Visit: Payer: Medicare Other | Admitting: Nurse Practitioner

## 2018-08-22 VITALS — BP 121/80 | HR 84 | Resp 16 | Ht 64.0 in | Wt 224.0 lb

## 2018-08-22 DIAGNOSIS — J4520 Mild intermittent asthma, uncomplicated: Secondary | ICD-10-CM | POA: Insufficient documentation

## 2018-08-22 DIAGNOSIS — B373 Candidiasis of vulva and vagina: Secondary | ICD-10-CM | POA: Diagnosis not present

## 2018-08-22 DIAGNOSIS — B3731 Acute candidiasis of vulva and vagina: Secondary | ICD-10-CM | POA: Insufficient documentation

## 2018-08-22 DIAGNOSIS — J069 Acute upper respiratory infection, unspecified: Secondary | ICD-10-CM | POA: Insufficient documentation

## 2018-08-22 MED ORDER — LEVOFLOXACIN 500 MG PO TABS: 500.0000 mg | ORAL_TABLET | Freq: Every day | ORAL | 0 refills | Status: DC

## 2018-08-22 MED ORDER — FLUCONAZOLE 150 MG PO TABS: ORAL_TABLET | ORAL | 1 refills | Status: DC

## 2018-08-22 NOTE — Progress Notes (Signed)
[facility] [place], [place] [address]  Internal MEDICINE  Office Visit Note  Patient Name: Janet Austin  [id number]  [id number]  Date of Service: 08/22/2018   Pt is here for a sick visit.   Chief Complaint  Patient presents with  . Nasal Congestion    symptoms comes and goes.  . Cough    cough in the mornings and spits up clear mucous  . Fever    ran fever for about a week the last week of july when it started but not lately.   . Fatigue    since first started comes and goes, sometimes is sudden fatigue     Cough  This is a new problem. The current episode started 1 to 4 weeks ago. The problem has been gradually worsening. The problem occurs every few minutes. The cough is productive of sputum. Associated symptoms include chills, ear pain, a fever, headaches, myalgias, nasal congestion, postnasal drip, rhinorrhea, a sore throat and wheezing. Pertinent negatives include no chest pain or rash. She has tried rest and a beta-agonist inhaler for the symptoms. The treatment provided moderate relief. Her past medical history is significant for asthma and environmental allergies.        Current Medication:  Outpatient Encounter Medications as of 08/22/2018  Medication Sig  . albuterol (PROVENTIL HFA;VENTOLIN HFA) 108 (90 Base) MCG/ACT inhaler Inhale into the lungs every 6 (six) hours as needed for wheezing or shortness of breath.  . ALPRAZolam (XANAX) 1 MG tablet Take 1 tablet (1 mg total) by mouth at bedtime as needed for anxiety.  Marland Kitchen atorvastatin (LIPITOR) 10 MG tablet Take 1 tablet (10 mg total) by mouth daily.  . clobetasol ointment (TEMOVATE) 3.26 % Apply 1 application topically daily as needed.  . cyclobenzaprine (FLEXERIL) 5 MG tablet Take 5 mg by mouth 3 (three) times daily as needed for muscle spasms.  . OxyCODONE HCl, Abuse Deter, (OXAYDO) 5 MG TABA Take by mouth.  . valACYclovir (VALTREX) 1000 MG tablet Take 1 tablet (1,000 mg total) by mouth 2  (two) times daily.  Marland Kitchen zolpidem (AMBIEN) 10 MG tablet Take 1 tablet (10 mg total) by mouth at bedtime as needed for sleep.  . fluconazole (DIFLUCAN) 150 MG tablet Take 1 tablet po once. May repeat dose in 3 days as needed for persistent symptoms.  Marland Kitchen levofloxacin (LEVAQUIN) 500 MG tablet Take 1 tablet (500 mg total) by mouth daily.   No facility-administered encounter medications on file as of 08/22/2018.       Medical History: Past Medical History:  Diagnosis Date  . Anxiety   . Arthritis   . Cancer ([place])    left breast  . Headache    migraines  . PONV (postoperative nausea and vomiting)      Today's Vitals   08/22/18 0907  BP: 121/80  Pulse: 84  Resp: 16  SpO2: 95%  Weight: 224 lb (101.6 kg)  Height: 5\' 4"  (1.626 m)   Review of Systems  Constitutional: Positive for chills and fever.  HENT: Positive for congestion, ear pain, postnasal drip, rhinorrhea, sinus pain, sore throat and voice change.   Eyes: Negative.   Respiratory: Positive for cough and wheezing.   Cardiovascular: Negative for chest pain and palpitations.  Gastrointestinal: Positive for nausea. Negative for vomiting.  Endocrine: Negative for cold intolerance, heat intolerance, polydipsia, polyphagia and polyuria.  Musculoskeletal: Positive for arthralgias and myalgias.  Skin: Negative for rash.  Allergic/Immunologic: Positive for environmental allergies.  Neurological:  Positive for headaches.  Hematological: Positive for adenopathy.  Psychiatric/Behavioral: Negative.     Physical Exam  Constitutional: She is oriented to person, place, and time. She appears well-developed and well-nourished. No distress.  HENT:  Head: Normocephalic and atraumatic.  Nose: Rhinorrhea present.  Mouth/Throat: Posterior oropharyngeal edema and posterior oropharyngeal erythema present. No oropharyngeal exudate.  Slight fullness in both external ear canals. Otherwise, they are normal.   Eyes: Pupils are equal, round, and  reactive to light. EOM are normal.  Neck: Normal range of motion. Neck supple. No JVD present. No tracheal deviation present. No thyromegaly present.  Cardiovascular: Normal rate, regular rhythm and normal heart sounds. Exam reveals no gallop and no friction rub.  No murmur heard. Pulmonary/Chest: Effort normal. No respiratory distress. She has no wheezes. She has no rales. She exhibits no tenderness.  Very faint expiratory wheeze.  Abdominal: Soft. Bowel sounds are normal. There is no tenderness.  Musculoskeletal: Normal range of motion.  Lymphadenopathy:    She has no cervical adenopathy.  Neurological: She is alert and oriented to person, place, and time. No cranial nerve deficit.  Skin: Skin is warm and dry. She is not diaphoretic.  Psychiatric: She has a normal mood and affect. Her behavior is normal. Judgment and thought content normal.  Nursing note and vitals reviewed.  Assessment/Plan: 1. Acute upper respiratory infection Start levaquin 500mg  daily for 10 days. Rest and increase fluids. Use OTC medication to alleviate symptoms.  - levofloxacin (LEVAQUIN) 500 MG tablet; Take 1 tablet (500 mg total) by mouth daily.  Dispense: 10 tablet; Refill: 0  2. Mild intermittent asthma without complication May use rescue inhaler as needed and as prescribed.   3. Vaginal candida Diflucan 150mg  once for yeast infection. May repeat in 3 days for persistent symptoms.  - fluconazole (DIFLUCAN) 150 MG tablet; Take 1 tablet po once. May repeat dose in 3 days as needed for persistent symptoms.  Dispense: 5 tablet; Refill: 1  General Counseling: Janet Austin verbalizes understanding of the findings of todays visit and agrees with plan of treatment. I have discussed any further diagnostic evaluation that may be needed or ordered today. We also reviewed her medications today. she has been encouraged to call the office with any questions or concerns that should arise related to todays  visit.    Counseling:  Rest and increase fluids. Continue using OTC medication to control symptoms.   This patient was seen by [place] with Dr Lavera Guise as a part of collaborative care agreement  Meds ordered this encounter  Medications  . levofloxacin (LEVAQUIN) 500 MG tablet    Sig: Take 1 tablet (500 mg total) by mouth daily.    Dispense:  10 tablet    Refill:  0    Order Specific Question:   Supervising Provider    Answer:   Lavera Guise [[id number]]  . fluconazole (DIFLUCAN) 150 MG tablet    Sig: Take 1 tablet po once. May repeat dose in 3 days as needed for persistent symptoms.    Dispense:  5 tablet    Refill:  1    Order Specific Question:   Supervising Provider    Answer:   Lavera Guise [[id number]]    Time spent: 15 Minutes

## 2018-08-26 DIAGNOSIS — G8928 Other chronic postprocedural pain: Secondary | ICD-10-CM | POA: Diagnosis not present

## 2018-08-26 DIAGNOSIS — G894 Chronic pain syndrome: Secondary | ICD-10-CM | POA: Diagnosis not present

## 2018-08-26 DIAGNOSIS — M792 Neuralgia and neuritis, unspecified: Secondary | ICD-10-CM | POA: Diagnosis not present

## 2018-08-26 DIAGNOSIS — Z79891 Long term (current) use of opiate analgesic: Secondary | ICD-10-CM | POA: Diagnosis not present

## 2018-08-29 ENCOUNTER — Other Ambulatory Visit: Payer: Self-pay | Admitting: Nurse Practitioner

## 2018-08-29 DIAGNOSIS — E782 Mixed hyperlipidemia: Secondary | ICD-10-CM

## 2018-08-29 MED ORDER — ATORVASTATIN CALCIUM 10 MG PO TABS: 10.0000 mg | ORAL_TABLET | Freq: Every day | ORAL | 4 refills | Status: DC

## 2018-09-01 ENCOUNTER — Telehealth: Payer: Self-pay

## 2018-09-01 ENCOUNTER — Other Ambulatory Visit: Payer: Self-pay | Admitting: Nurse Practitioner

## 2018-09-01 DIAGNOSIS — J069 Acute upper respiratory infection, unspecified: Secondary | ICD-10-CM

## 2018-09-01 MED ORDER — AZITHROMYCIN 250 MG PO TABS: ORAL_TABLET | ORAL | 0 refills | Status: DC

## 2018-09-01 NOTE — Telephone Encounter (Signed)
D/c levofloxacin 500mg  as causing headache. Will do round z-pack to follow up. Take as directed.

## 2018-09-01 NOTE — Telephone Encounter (Signed)
Pt advised we send zpak  

## 2018-09-01 NOTE — Progress Notes (Signed)
D/c levofloxacin 500mg  as causing headache. Will do round z-pack to follow up. Take as directed.

## 2018-09-12 ENCOUNTER — Ambulatory Visit: Payer: Medicare Other | Admitting: Adult Health

## 2018-09-12 ENCOUNTER — Encounter: Payer: Self-pay | Admitting: Adult Health

## 2018-09-12 VITALS — BP 132/82 | HR 83 | Temp 98.0°F | Resp 16 | Ht 64.0 in | Wt 225.6 lb

## 2018-09-12 DIAGNOSIS — R5383 Other fatigue: Secondary | ICD-10-CM | POA: Diagnosis not present

## 2018-09-12 DIAGNOSIS — J069 Acute upper respiratory infection, unspecified: Secondary | ICD-10-CM

## 2018-09-12 DIAGNOSIS — J40 Bronchitis, not specified as acute or chronic: Secondary | ICD-10-CM | POA: Diagnosis not present

## 2018-09-12 MED ORDER — SULFAMETHOXAZOLE-TRIMETHOPRIM 800-160 MG PO TABS: 1.0000 | ORAL_TABLET | Freq: Two times a day (BID) | ORAL | 0 refills | Status: DC

## 2018-09-12 NOTE — Patient Instructions (Signed)
Upper Respiratory Infection, Adult Most upper respiratory infections (URIs) are caused by a virus. A URI affects the nose, throat, and upper air passages. The most common type of URI is often called "the common cold." Follow these instructions at home:  Take medicines only as told by your doctor.  Gargle warm saltwater or take cough drops to comfort your throat as told by your doctor.  Use a warm mist humidifier or inhale steam from a shower to increase air moisture. This may make it easier to breathe.  Drink enough fluid to keep your pee (urine) clear or pale yellow.  Eat soups and other clear broths.  Have a healthy diet.  Rest as needed.  Go back to work when your fever is gone or your doctor says it is okay. ? You may need to stay home longer to avoid giving your URI to others. ? You can also wear a face mask and wash your hands often to prevent spread of the virus.  Use your inhaler more if you have asthma.  Do not use any tobacco products, including cigarettes, chewing tobacco, or electronic cigarettes. If you need help quitting, ask your doctor. Contact a doctor if:  You are getting worse, not better.  Your symptoms are not helped by medicine.  You have chills.  You are getting more short of breath.  You have brown or red mucus.  You have yellow or brown discharge from your nose.  You have pain in your face, especially when you bend forward.  You have a fever.  You have puffy (swollen) neck glands.  You have pain while swallowing.  You have white areas in the back of your throat. Get help right away if:  You have very bad or constant: ? Headache. ? Ear pain. ? Pain in your forehead, behind your eyes, and over your cheekbones (sinus pain). ? Chest pain.  You have long-lasting (chronic) lung disease and any of the following: ? Wheezing. ? Long-lasting cough. ? Coughing up blood. ? A change in your usual mucus.  You have a stiff neck.  You have  changes in your: ? Vision. ? Hearing. ? Thinking. ? Mood. This information is not intended to replace advice given to you by your health care provider. Make sure you discuss any questions you have with your health care provider. Document Released: 05/21/2008 Document Revised: 08/05/2016 Document Reviewed: 03/10/2014 Elsevier Interactive Patient Education  2018 [employer]  

## 2018-09-12 NOTE — Progress Notes (Signed)
[place], [place] [address]  Internal MEDICINE  Office Visit Note  Patient Name: Janet Austin  [id number]  [id number]  Date of Service: 09/12/2018  Chief Complaint  Patient presents with  . Cough    dry cough      HPI Pt is here for a sick visit. Pt has been recently treated with Levaquin which she could not tolerate.  She did complete a Z-Pak however she continues to cough, and have chest congestin. She reports fatigue, and just feeling bad. She denies fever, headaches or other symptoms.  She reports her coughing is worse in the morning.      Current Medication:  Outpatient Encounter Medications as of 09/12/2018  Medication Sig  . albuterol (PROVENTIL HFA;VENTOLIN HFA) 108 (90 Base) MCG/ACT inhaler Inhale into the lungs every 6 (six) hours as needed for wheezing or shortness of breath.  . ALPRAZolam (XANAX) 1 MG tablet Take 1 tablet (1 mg total) by mouth at bedtime as needed for anxiety.  Marland Kitchen atorvastatin (LIPITOR) 10 MG tablet Take 1 tablet (10 mg total) by mouth daily.  . clobetasol ointment (TEMOVATE) 7.41 % Apply 1 application topically daily as needed.  . cyclobenzaprine (FLEXERIL) 5 MG tablet Take 5 mg by mouth 3 (three) times daily as needed for muscle spasms.  . fluconazole (DIFLUCAN) 150 MG tablet Take 1 tablet po once. May repeat dose in 3 days as needed for persistent symptoms.  . OxyCODONE HCl, Abuse Deter, (OXAYDO) 5 MG TABA Take by mouth.  . valACYclovir (VALTREX) 1000 MG tablet Take 1 tablet (1,000 mg total) by mouth 2 (two) times daily.  Marland Kitchen zolpidem (AMBIEN) 10 MG tablet Take 1 tablet (10 mg total) by mouth at bedtime as needed for sleep.  Marland Kitchen azithromycin (ZITHROMAX) 250 MG tablet z-pack - take as directed for 5 days (Patient not taking: Reported on 09/12/2018)  . sulfamethoxazole-trimethoprim (BACTRIM DS,SEPTRA DS) 800-160 MG tablet Take 1 tablet by mouth 2 (two) times daily.   No facility-administered encounter medications on  file as of 09/12/2018.       Medical History: Past Medical History:  Diagnosis Date  . Anxiety   . Arthritis   . Cancer ([place])    left breast  . Headache    migraines  . PONV (postoperative nausea and vomiting)      Vital Signs: BP 132/82   Pulse 83   Temp 98 F (36.7 C)   Resp 16   Ht 5\' 4"  (1.626 m)   Wt 225 lb 9.6 oz (102.3 kg)   SpO2 97%   BMI 38.72 kg/m    Review of Systems  Constitutional: Negative for chills, fatigue and unexpected weight change.  HENT: Negative for congestion, postnasal drip, rhinorrhea, sinus pressure, sinus pain, sneezing and sore throat.   Eyes: Negative for photophobia, pain and redness.  Respiratory: Positive for cough. Negative for chest tightness and shortness of breath.   Cardiovascular: Negative for chest pain and palpitations.  Gastrointestinal: Negative for abdominal pain, constipation, diarrhea, nausea and vomiting.  Endocrine: Negative.   Genitourinary: Negative for dysuria and frequency.  Musculoskeletal: Negative for arthralgias, back pain, joint swelling and neck pain.  Skin: Negative for rash.  Allergic/Immunologic: Negative.   Neurological: Negative for tremors and numbness.  Hematological: Negative for adenopathy. Does not bruise/bleed easily.  Psychiatric/Behavioral: Negative for behavioral problems and sleep disturbance. The patient is not nervous/anxious.     Physical Exam  Constitutional: She is oriented to person, place, and time.  She appears well-developed and well-nourished. No distress.  HENT:  Head: Normocephalic and atraumatic.  Mouth/Throat: Oropharynx is clear and moist. No oropharyngeal exudate.  Eyes: Pupils are equal, round, and reactive to light. EOM are normal.  Neck: Normal range of motion. Neck supple. No JVD present. No tracheal deviation present. No thyromegaly present.  Cardiovascular: Normal rate, regular rhythm and normal heart sounds. Exam reveals no gallop and no friction rub.  No murmur  heard. Pulmonary/Chest: Effort normal and breath sounds normal. No respiratory distress. She has no wheezes. She has no rales. She exhibits no tenderness.  Abdominal: Soft. There is no tenderness. There is no guarding.  Musculoskeletal: Normal range of motion.  Lymphadenopathy:    She has no cervical adenopathy.  Neurological: She is alert and oriented to person, place, and time. No cranial nerve deficit.  Skin: Skin is warm and dry. She is not diaphoretic.  Psychiatric: She has a normal mood and affect. Her behavior is normal. Judgment and thought content normal.  Nursing note and vitals reviewed.  Assessment/Plan: 1. Bronchitis Use Humidifier, continue to use Ventolin inhaler.   2. Upper respiratory tract infection, unspecified type Rx for Bactrim given. Discussed CXR, patient would like to wait a little while longer before getting x-ray.   3. Fatigue, unspecified type Labs for Iron, Vit D and B12 given to patient on paper.  General Counseling: taelyn broecker understanding of the findings of todays visit and agrees with plan of treatment. I have discussed any further diagnostic evaluation that may be needed or ordered today. We also reviewed her medications today. she has been encouraged to call the office with any questions or concerns that should arise related to todays visit.   No orders of the defined types were placed in this encounter.   Meds ordered this encounter  Medications  . sulfamethoxazole-trimethoprim (BACTRIM DS,SEPTRA DS) 800-160 MG tablet    Sig: Take 1 tablet by mouth 2 (two) times daily.    Dispense:  10 tablet    Refill:  0    Time spent: 25 Minutes  This patient was seen by Orson Gear AGNP-C in Collaboration with Dr Lavera Guise as a part of collaborative care agreement

## 2018-09-29 ENCOUNTER — Ambulatory Visit
Admission: RE | Admit: 2018-09-29 | Discharge: 2018-09-29 | Disposition: A | Discharge status: Home / Self Care | Payer: Medicare Other | Source: Ambulatory Visit | Attending: Adult Health | Admitting: Adult Health

## 2018-09-29 ENCOUNTER — Encounter: Payer: Self-pay | Admitting: Adult Health

## 2018-09-29 ENCOUNTER — Ambulatory Visit: Payer: Medicare Other | Admitting: Adult Health

## 2018-09-29 VITALS — BP 142/80 | HR 58 | Temp 98.4°F | Resp 16 | Ht 64.0 in | Wt 221.0 lb

## 2018-09-29 DIAGNOSIS — J321 Chronic frontal sinusitis: Secondary | ICD-10-CM | POA: Diagnosis not present

## 2018-09-29 DIAGNOSIS — J069 Acute upper respiratory infection, unspecified: Secondary | ICD-10-CM | POA: Diagnosis not present

## 2018-09-29 DIAGNOSIS — R05 Cough: Secondary | ICD-10-CM | POA: Diagnosis not present

## 2018-09-29 DIAGNOSIS — R059 Cough, unspecified: Secondary | ICD-10-CM

## 2018-09-29 DIAGNOSIS — J4520 Mild intermittent asthma, uncomplicated: Secondary | ICD-10-CM | POA: Diagnosis not present

## 2018-09-29 MED ORDER — PREDNISONE 10 MG PO TABS: ORAL_TABLET | ORAL | 0 refills | Status: DC

## 2018-09-29 MED ORDER — AMOXICILLIN-POT CLAVULANATE 875-125 MG PO TABS: 1.0000 | ORAL_TABLET | Freq: Two times a day (BID) | ORAL | 0 refills | Status: DC

## 2018-09-29 NOTE — Progress Notes (Signed)
[facility] [place], [place] [address]  Internal MEDICINE  Office Visit Note  Patient Name: Janet Austin  [id number]  [id number]  Date of Service: 09/29/2018  Chief Complaint  Patient presents with  . Cough     HPI Pt is here for a sick visit. She was seen on 9/27 for similar complaint.  Patient reports her symptoms are getting no better at this time.  Patient had similar onset 2 years ago and took multiple antibiotics until finally relieved.  Today she presents with symptoms more in line with sinus drainage.  She reports intermittent coughing and that she can feel the postnasal drip.  She is not coughing anything up at this time.  She has completed a Z-Pak as well as Bactrim in the last few weeks.  She does not have a recent chest x-ray in her file.  Given her history of cancer would like to keep that at this.   Current Medication:  Outpatient Encounter Medications as of 09/29/2018  Medication Sig  . albuterol (PROVENTIL HFA;VENTOLIN HFA) 108 (90 Base) MCG/ACT inhaler Inhale into the lungs every 6 (six) hours as needed for wheezing or shortness of breath.  . ALPRAZolam (XANAX) 1 MG tablet Take 1 tablet (1 mg total) by mouth at bedtime as needed for anxiety.  Marland Kitchen amoxicillin-clavulanate (AUGMENTIN) 875-125 MG tablet Take 1 tablet by mouth 2 (two) times daily.  Marland Kitchen atorvastatin (LIPITOR) 10 MG tablet Take 1 tablet (10 mg total) by mouth daily.  Marland Kitchen azithromycin (ZITHROMAX) 250 MG tablet z-pack - take as directed for 5 days (Patient not taking: Reported on 09/12/2018)  . clobetasol ointment (TEMOVATE) 4.97 % Apply 1 application topically daily as needed.  . cyclobenzaprine (FLEXERIL) 5 MG tablet Take 5 mg by mouth 3 (three) times daily as needed for muscle spasms.  . fluconazole (DIFLUCAN) 150 MG tablet Take 1 tablet po once. May repeat dose in 3 days as needed for persistent symptoms.  . OxyCODONE HCl, Abuse Deter, (OXAYDO) 5 MG TABA Take by mouth.  . predniSONE  (DELTASONE) 10 MG tablet Use per dose pack  . sulfamethoxazole-trimethoprim (BACTRIM DS,SEPTRA DS) 800-160 MG tablet Take 1 tablet by mouth 2 (two) times daily.  . valACYclovir (VALTREX) 1000 MG tablet Take 1 tablet (1,000 mg total) by mouth 2 (two) times daily.  Marland Kitchen zolpidem (AMBIEN) 10 MG tablet Take 1 tablet (10 mg total) by mouth at bedtime as needed for sleep.   No facility-administered encounter medications on file as of 09/29/2018.       Medical History: Past Medical History:  Diagnosis Date  . Anxiety   . Arthritis   . Cancer ([place])    left breast  . Headache    migraines  . PONV (postoperative nausea and vomiting)      Vital Signs: BP (!) 142/80   Pulse (!) 58   Temp 98.4 F (36.9 C)   Resp 16   Ht 5\' 4"  (1.626 m)   Wt 221 lb (100.2 kg)   SpO2 98%   BMI 37.93 kg/m    Review of Systems  Constitutional: Negative for chills, fatigue and unexpected weight change.  HENT: Negative for congestion, rhinorrhea, sneezing and sore throat.   Eyes: Negative for photophobia, pain and redness.  Respiratory: Positive for cough and wheezing. Negative for chest tightness and shortness of breath.   Cardiovascular: Negative for chest pain and palpitations.  Gastrointestinal: Negative for abdominal pain, constipation, diarrhea, nausea and vomiting.  Endocrine: Negative.  Genitourinary: Negative for dysuria and frequency.  Musculoskeletal: Negative for arthralgias, back pain, joint swelling and neck pain.  Skin: Negative for rash.  Allergic/Immunologic: Negative.   Neurological: Negative for tremors and numbness.  Hematological: Negative for adenopathy. Does not bruise/bleed easily.  Psychiatric/Behavioral: Negative for behavioral problems and sleep disturbance. The patient is not nervous/anxious.     Physical Exam  Constitutional: She is oriented to person, place, and time. She appears well-developed and well-nourished. No distress.  HENT:  Head: Normocephalic and  atraumatic.  Mouth/Throat: Oropharynx is clear and moist. No oropharyngeal exudate.  Eyes: Pupils are equal, round, and reactive to light. EOM are normal.  Neck: Normal range of motion. Neck supple. No JVD present. No tracheal deviation present. No thyromegaly present.  Cardiovascular: Normal rate, regular rhythm and normal heart sounds. Exam reveals no gallop and no friction rub.  No murmur heard. Pulmonary/Chest: Effort normal and breath sounds normal. No respiratory distress. She has no wheezes. She has no rales. She exhibits no tenderness.  Abdominal: Soft. There is no tenderness. There is no guarding.  Musculoskeletal: Normal range of motion.  Lymphadenopathy:    She has no cervical adenopathy.  Neurological: She is alert and oriented to person, place, and time. No cranial nerve deficit.  Skin: Skin is warm and dry. She is not diaphoretic.  Psychiatric: She has a normal mood and affect. Her behavior is normal. Judgment and thought content normal.  Nursing note and vitals reviewed.  Assessment/Plan: 1. Upper respiratory tract infection, unspecified type Patient recently been treated with upper respiratory infection.  I am inclined to give her prednisone for her persistent cough.  Would like to do chest x-ray to rule out other issues at this time. - DG Chest 2 View; Future - predniSONE (DELTASONE) 10 MG tablet; Use per dose pack  Dispense: 21 tablet; Refill: 0 - amoxicillin-clavulanate (AUGMENTIN) 875-125 MG tablet; Take 1 tablet by mouth 2 (two) times daily.  Dispense: 14 tablet; Refill: 0  2. Frontal sinusitis, unspecified chronicity Given course of Augmentin for sinus pain and pressure at this time.  3. Cough Patient continues to report intermittent persistent cough.  Denies productivity.  Given prednisone course to combat inflammation.  4. Mild intermittent asthma without complication - predniSONE (DELTASONE) 10 MG tablet; Use per dose pack  Dispense: 21 tablet; Refill:  0  General Counseling: [name] verbalizes understanding of the findings of todays visit and agrees with plan of treatment. I have discussed any further diagnostic evaluation that may be needed or ordered today. We also reviewed her medications today. she has been encouraged to call the office with any questions or concerns that should arise related to todays visit.   Orders Placed This Encounter  Procedures  . DG Chest 2 View    Meds ordered this encounter  Medications  . predniSONE (DELTASONE) 10 MG tablet    Sig: Use per dose pack    Dispense:  21 tablet    Refill:  0  . amoxicillin-clavulanate (AUGMENTIN) 875-125 MG tablet    Sig: Take 1 tablet by mouth 2 (two) times daily.    Dispense:  14 tablet    Refill:  0    Time spent: 25 Minutes  This patient was seen by Orson Gear AGNP-C in Collaboration with Dr Lavera Guise as a part of collaborative care agreement.  Kendell Bane AGNP-C Internal Medicine

## 2018-09-29 NOTE — Patient Instructions (Signed)

## 2018-09-30 ENCOUNTER — Telehealth: Payer: Self-pay

## 2018-09-30 NOTE — Telephone Encounter (Signed)
Pt called in regards to chest x ray. Spoke with Janet Austin and advised pt that the x ray is normal and we will call back if he wants to do a CT scan on her.

## 2018-11-03 ENCOUNTER — Encounter: Payer: Self-pay | Admitting: Adult Health

## 2018-11-03 ENCOUNTER — Ambulatory Visit: Payer: Medicare Other | Admitting: Adult Health

## 2018-11-03 ENCOUNTER — Other Ambulatory Visit: Payer: Self-pay | Admitting: Nurse Practitioner

## 2018-11-03 VITALS — BP 132/76 | HR 88 | Resp 16 | Ht 64.0 in | Wt 220.0 lb

## 2018-11-03 DIAGNOSIS — L309 Dermatitis, unspecified: Secondary | ICD-10-CM

## 2018-11-03 DIAGNOSIS — F411 Generalized anxiety disorder: Secondary | ICD-10-CM

## 2018-11-03 DIAGNOSIS — E782 Mixed hyperlipidemia: Secondary | ICD-10-CM

## 2018-11-03 DIAGNOSIS — I10 Essential (primary) hypertension: Secondary | ICD-10-CM | POA: Diagnosis not present

## 2018-11-03 DIAGNOSIS — Z79899 Other long term (current) drug therapy: Secondary | ICD-10-CM | POA: Diagnosis not present

## 2018-11-03 DIAGNOSIS — Z0001 Encounter for general adult medical examination with abnormal findings: Secondary | ICD-10-CM | POA: Diagnosis not present

## 2018-11-03 DIAGNOSIS — E559 Vitamin D deficiency, unspecified: Secondary | ICD-10-CM | POA: Diagnosis not present

## 2018-11-03 LAB — POCT URINE DRUG SCREEN
POC Amphetamine UR: NOT DETECTED
POC BARBITURATE UR: NOT DETECTED
POC BENZODIAZEPINES UR: POSITIVE — AB
POC COCAINE UR: NOT DETECTED
POC Ecstasy UR: NOT DETECTED
POC MARIJUANA UR: NOT DETECTED
POC METHADONE UR: NOT DETECTED
POC METHAMPHETAMINE UR: NOT DETECTED
POC OXYCODONE UR: POSITIVE — AB
POC PHENCYCLIDINE UR: NOT DETECTED
POC TRICYCLICS UR: NOT DETECTED

## 2018-11-03 MED ORDER — TRIAMCINOLONE ACETONIDE 0.5 % EX OINT: 1.0000 "application " | TOPICAL_OINTMENT | Freq: Two times a day (BID) | CUTANEOUS | 0 refills | Status: DC

## 2018-11-03 MED ORDER — ATORVASTATIN CALCIUM 10 MG PO TABS: 10.0000 mg | ORAL_TABLET | Freq: Every day | ORAL | 4 refills | Status: DC

## 2018-11-03 MED ORDER — CLOBETASOL PROPIONATE 0.05 % EX OINT: 1.0000 "application " | TOPICAL_OINTMENT | Freq: Every day | CUTANEOUS | 0 refills | Status: DC | PRN

## 2018-11-03 NOTE — Addendum Note (Signed)
Addended by: Lenon Oms on: 11/03/2018 04:10 PM   Modules accepted: Orders

## 2018-11-03 NOTE — Progress Notes (Signed)
[facility] [place], [place] [address]  Internal MEDICINE  Office Visit Note  Patient Name: Janet Austin  [id number]  [id number]  Date of Service: 11/03/2018  Chief Complaint  Patient presents with  . Hyperlipidemia    medication refill clobetasol    HPI  Pt is here for follow up.  She has a history of upper respiratory infections, asthma, GAD, eczema and hyperlipidemia.  She reports that she has recovered nicely from her upper respiratory infections and no longer has a chronic cough.  She reports her breathing has been doing well, and her anxiety has been under control.  She reports that she is out of her eczema medication and would like a refill at this time.   Current Medication: Outpatient Encounter Medications as of 11/03/2018  Medication Sig  . albuterol (PROVENTIL HFA;VENTOLIN HFA) 108 (90 Base) MCG/ACT inhaler Inhale into the lungs every 6 (six) hours as needed for wheezing or shortness of breath.  . ALPRAZolam (XANAX) 1 MG tablet Take 1 tablet (1 mg total) by mouth at bedtime as needed for anxiety.  Marland Kitchen atorvastatin (LIPITOR) 10 MG tablet Take 1 tablet (10 mg total) by mouth daily.  . clobetasol ointment (TEMOVATE) 7.32 % Apply 1 application topically daily as needed.  . cyclobenzaprine (FLEXERIL) 5 MG tablet Take 5 mg by mouth 3 (three) times daily as needed for muscle spasms.  . fluconazole (DIFLUCAN) 150 MG tablet Take 1 tablet po once. May repeat dose in 3 days as needed for persistent symptoms.  . OxyCODONE HCl, Abuse Deter, (OXAYDO) 5 MG TABA Take by mouth.  . valACYclovir (VALTREX) 1000 MG tablet Take 1 tablet (1,000 mg total) by mouth 2 (two) times daily.  Marland Kitchen zolpidem (AMBIEN) 10 MG tablet Take 1 tablet (10 mg total) by mouth at bedtime as needed for sleep.  . [DISCONTINUED] atorvastatin (LIPITOR) 10 MG tablet Take 1 tablet (10 mg total) by mouth daily.  . [DISCONTINUED] clobetasol ointment (TEMOVATE) 2.02 % Apply 1 application topically  daily as needed.  . triamcinolone ointment (KENALOG) 0.5 % Apply 1 application topically 2 (two) times daily.  . [DISCONTINUED] amoxicillin-clavulanate (AUGMENTIN) 875-125 MG tablet Take 1 tablet by mouth 2 (two) times daily. (Patient not taking: Reported on 11/03/2018)  . [DISCONTINUED] azithromycin (ZITHROMAX) 250 MG tablet z-pack - take as directed for 5 days (Patient not taking: Reported on 09/12/2018)  . [DISCONTINUED] predniSONE (DELTASONE) 10 MG tablet Use per dose pack (Patient not taking: Reported on 11/03/2018)  . [DISCONTINUED] sulfamethoxazole-trimethoprim (BACTRIM DS,SEPTRA DS) 800-160 MG tablet Take 1 tablet by mouth 2 (two) times daily. (Patient not taking: Reported on 11/03/2018)   No facility-administered encounter medications on file as of 11/03/2018.     Surgical History: Past Surgical History:  Procedure Laterality Date  . ABDOMINAL HYSTERECTOMY    . BREAST SURGERY     double mastectomy  . BREAST SURGERY     reconstruction  . COLONOSCOPY WITH PROPOFOL N/A 05/29/2016   Procedure: COLONOSCOPY WITH PROPOFOL;  Surgeon: Lollie Sails, MD;  Location: [facility] ENDOSCOPY;  Service: Endoscopy;  Laterality: N/A;  . KNEE ARTHROSCOPY Right   . MASTECTOMY      Medical History: Past Medical History:  Diagnosis Date  . Anxiety   . Arthritis   . Cancer ([place])    left breast  . Headache    migraines  . PONV (postoperative nausea and vomiting)     Family History: Family History  Problem Relation Age of Onset  . Hypertension  Father     Social History   Socioeconomic History  . Marital status: Married    Spouse name: Not on file  . Number of children: Not on file  . Years of education: Not on file  . Highest education level: Not on file  Occupational History  . Not on file  Social Needs  . Financial resource strain: Not on file  . Food insecurity:    Worry: Not on file    Inability: Not on file  . Transportation needs:    Medical: Not on file    Non-medical:  Not on file  Tobacco Use  . Smoking status: Never Smoker  . Smokeless tobacco: Never Used  Substance and Sexual Activity  . Alcohol use: No  . Drug use: No  . Sexual activity: Not on file  Lifestyle  . Physical activity:    Days per week: Not on file    Minutes per session: Not on file  . Stress: Not on file  Relationships  . Social connections:    Talks on phone: Not on file    Gets together: Not on file    Attends religious service: Not on file    Active member of club or organization: Not on file    Attends meetings of clubs or organizations: Not on file    Relationship status: Not on file  . Intimate partner violence:    Fear of current or ex partner: Not on file    Emotionally abused: Not on file    Physically abused: Not on file    Forced sexual activity: Not on file  Other Topics Concern  . Not on file  Social History Narrative  . Not on file      Review of Systems  Constitutional: Negative for chills, fatigue and unexpected weight change.  HENT: Negative for congestion, rhinorrhea, sneezing and sore throat.   Eyes: Negative for photophobia, pain and redness.  Respiratory: Negative for cough, chest tightness and shortness of breath.   Cardiovascular: Negative for chest pain and palpitations.  Gastrointestinal: Negative for abdominal pain, constipation, diarrhea, nausea and vomiting.  Endocrine: Negative.   Genitourinary: Negative for dysuria and frequency.  Musculoskeletal: Negative for arthralgias, back pain, joint swelling and neck pain.  Skin: Negative for rash.  Allergic/Immunologic: Negative.   Neurological: Negative for tremors and numbness.  Hematological: Negative for adenopathy. Does not bruise/bleed easily.  Psychiatric/Behavioral: Negative for behavioral problems and sleep disturbance. The patient is not nervous/anxious.     Vital Signs: BP 132/76 (BP Location: Left Arm, Patient Position: Sitting, Cuff Size: Normal)   Pulse 88   Resp 16   Ht 5'  4" (1.626 m)   Wt 220 lb (99.8 kg)   SpO2 97%   BMI 37.76 kg/m    Physical Exam  Constitutional: She is oriented to person, place, and time. She appears well-developed and well-nourished. No distress.  HENT:  Head: Normocephalic and atraumatic.  Mouth/Throat: Oropharynx is clear and moist. No oropharyngeal exudate.  Eyes: Pupils are equal, round, and reactive to light. EOM are normal.  Neck: Normal range of motion. Neck supple. No JVD present. No tracheal deviation present. No thyromegaly present.  Cardiovascular: Normal rate, regular rhythm and normal heart sounds. Exam reveals no gallop and no friction rub.  No murmur heard. Pulmonary/Chest: Effort normal and breath sounds normal. No respiratory distress. She has no wheezes. She has no rales. She exhibits no tenderness.  Abdominal: Soft. There is no tenderness. There is no guarding.  Musculoskeletal: Normal range of motion.  Lymphadenopathy:    She has no cervical adenopathy.  Neurological: She is alert and oriented to person, place, and time. No cranial nerve deficit.  Skin: Skin is warm and dry. Rash noted. She is not diaphoretic.  eczema to bilateral hands.  Psychiatric: She has a normal mood and affect. Her behavior is normal. Judgment and thought content normal.  Nursing note and vitals reviewed.   Assessment/Plan: 1. Eczema, unspecified type Kenalog and clobetasol prescription sent to patient's pharmacy.  Patient reports now that the weather is cold her hands have broken out with more eczema.  She is currently out of her medication and refills are provided. - clobetasol ointment (TEMOVATE) 0.05 %; Apply 1 application topically daily as needed.  Dispense: 60 g; Refill: 0 - triamcinolone ointment (KENALOG) 0.5 %; Apply 1 application topically 2 (two) times daily.  Dispense: 30 g; Refill: 0  2. Mixed hyperlipidemia Refill patient's Lipitor.  Will check lipid panel at next physical. - atorvastatin (LIPITOR) 10 MG tablet; Take  1 tablet (10 mg total) by mouth daily.  Dispense: 90 tablet; Refill: 4  3. Generalized anxiety disorder Patient because she reports some anxiety and still requires her Xanax.  She does not need a refill at this time she was seen in 3 months for follow-up on this and refills.  General Counseling: Janet Austin understanding of the findings of todays visit and agrees with plan of treatment. I have discussed any further diagnostic evaluation that may be needed or ordered today. We also reviewed her medications today. she has been encouraged to call the office with any questions or concerns that should arise related to todays visit.    No orders of the defined types were placed in this encounter.   Meds ordered this encounter  Medications  . clobetasol ointment (TEMOVATE) 0.05 %    Sig: Apply 1 application topically daily as needed.    Dispense:  60 g    Refill:  0  . triamcinolone ointment (KENALOG) 0.5 %    Sig: Apply 1 application topically 2 (two) times daily.    Dispense:  30 g    Refill:  0  . atorvastatin (LIPITOR) 10 MG tablet    Sig: Take 1 tablet (10 mg total) by mouth daily.    Dispense:  90 tablet    Refill:  4    Time spent: 25 Minutes   This patient was seen by Orson Gear AGNP-C in Collaboration with Dr Lavera Guise as a part of collaborative care agreement     Kendell Bane AGNP-C Internal medicine

## 2018-11-04 LAB — TSH: TSH: 3.25 u[IU]/mL (ref 0.450–4.500)

## 2018-11-04 LAB — B12 AND FOLATE PANEL
Folate: 17.5 ng/mL (ref >3.0)
Vitamin B-12: 348 pg/mL (ref 232–1245)

## 2018-11-04 LAB — COMPREHENSIVE METABOLIC PANEL
A/G RATIO: 1.8 (ref 1.2–2.2)
ALBUMIN: 4.2 g/dL (ref 3.5–5.5)
ALT: 10 IU/L (ref 0–32)
AST: 13 IU/L (ref 0–40)
Alkaline Phosphatase: 114 IU/L (ref 39–117)
BILIRUBIN TOTAL: 0.4 mg/dL (ref 0.0–1.2)
BUN / CREAT RATIO: 15 (ref 9–23)
BUN: 12 mg/dL (ref 6–24)
CHLORIDE: 101 mmol/L (ref 96–106)
CO2: 21 mmol/L (ref 20–29)
Calcium: 9.1 mg/dL (ref 8.7–10.2)
Creatinine, Ser: 0.79 mg/dL (ref 0.57–1.00)
GFR calc non Af Amer: 83 mL/min/{1.73_m2} (ref >59)
GFR, EST AFRICAN AMERICAN: 96 mL/min/{1.73_m2} (ref >59)
Globulin, Total: 2.3 g/dL (ref 1.5–4.5)
Glucose: 100 mg/dL — ABNORMAL HIGH (ref 65–99)
Potassium: 4.2 mmol/L (ref 3.5–5.2)
Sodium: 138 mmol/L (ref 134–144)
TOTAL PROTEIN: 6.5 g/dL (ref 6.0–8.5)

## 2018-11-04 LAB — CBC
HEMATOCRIT: 39.1 % (ref 34.0–46.6)
Hemoglobin: 13.4 g/dL (ref 11.1–15.9)
MCH: 29.1 pg (ref 26.6–33.0)
MCHC: 34.3 g/dL (ref 31.5–35.7)
MCV: 85 fL (ref 79–97)
Platelets: 273 10*3/uL (ref 150–450)
RBC: 4.6 x10E6/uL (ref 3.77–5.28)
RDW: 12.7 % (ref 12.3–15.4)
WBC: 5.9 10*3/uL (ref 3.4–10.8)

## 2018-11-04 LAB — VITAMIN D 25 HYDROXY (VIT D DEFICIENCY, FRACTURES): Vit D, 25-Hydroxy: 24.9 ng/mL — ABNORMAL LOW (ref 30.0–100.0)

## 2018-11-04 LAB — LIPID PANEL W/O CHOL/HDL RATIO
Cholesterol, Total: 174 mg/dL (ref 100–199)
HDL: 43 mg/dL (ref >39)
LDL Calculated: 97 mg/dL (ref 0–99)
TRIGLYCERIDES: 169 mg/dL — AB (ref 0–149)
VLDL Cholesterol Cal: 34 mg/dL (ref 5–40)

## 2018-11-04 LAB — IRON AND TIBC
IRON SATURATION: 37 % (ref 15–55)
IRON: 121 ug/dL (ref 27–159)
TIBC: 323 ug/dL (ref 250–450)
UIBC: 202 ug/dL (ref 131–425)

## 2018-11-04 LAB — T4, FREE: FREE T4: 1.06 ng/dL (ref 0.82–1.77)

## 2018-11-10 ENCOUNTER — Ambulatory Visit: Payer: Self-pay | Admitting: Nurse Practitioner

## 2018-11-27 ENCOUNTER — Ambulatory Visit (INDEPENDENT_AMBULATORY_CARE_PROVIDER_SITE_OTHER): Payer: Medicare Other | Admitting: Nurse Practitioner

## 2018-11-27 ENCOUNTER — Encounter: Payer: Self-pay | Admitting: Nurse Practitioner

## 2018-11-27 VITALS — BP 120/68 | HR 80 | Temp 100.6°F | Resp 16 | Ht 64.0 in | Wt 219.0 lb

## 2018-11-27 DIAGNOSIS — J4520 Mild intermittent asthma, uncomplicated: Secondary | ICD-10-CM | POA: Diagnosis not present

## 2018-11-27 DIAGNOSIS — R6889 Other general symptoms and signs: Secondary | ICD-10-CM | POA: Diagnosis not present

## 2018-11-27 DIAGNOSIS — J069 Acute upper respiratory infection, unspecified: Secondary | ICD-10-CM

## 2018-11-27 LAB — POCT INFLUENZA A/B: Influenza A, POC: NEGATIVE

## 2018-11-27 MED ORDER — OSELTAMIVIR PHOSPHATE 75 MG PO CAPS: 75.0000 mg | ORAL_CAPSULE | Freq: Two times a day (BID) | ORAL | 0 refills | Status: DC

## 2018-11-27 MED ORDER — AZITHROMYCIN 250 MG PO TABS: ORAL_TABLET | ORAL | 0 refills | Status: DC

## 2018-11-27 NOTE — Progress Notes (Signed)
[facility] [place], [place] [address]  Internal MEDICINE  Office Visit Note  Patient Name: Janet Austin  [id number]  [id number]  Date of Service: 11/30/2018   Pt is here for a sick visit.  Chief Complaint  Patient presents with  . Cough  . Sinusitis  . Generalized Body Aches  . Fever     The patient is here for sick visit. She is c/o fever, congestion, postnasal drip, sore throat, and cough. Symptoms have been going on for past few days. They are not getting better with OTC medications. She believes she was exposed to the flu about a week ago. She denies nausea, vomiting, or diarrhea.        Current Medication:  Outpatient Encounter Medications as of 11/27/2018  Medication Sig  . albuterol (PROVENTIL HFA;VENTOLIN HFA) 108 (90 Base) MCG/ACT inhaler Inhale into the lungs every 6 (six) hours as needed for wheezing or shortness of breath.  . ALPRAZolam (XANAX) 1 MG tablet Take 1 tablet (1 mg total) by mouth at bedtime as needed for anxiety.  Marland Kitchen atorvastatin (LIPITOR) 10 MG tablet Take 1 tablet (10 mg total) by mouth daily.  . clobetasol ointment (TEMOVATE) 7.35 % Apply 1 application topically daily as needed.  . cyclobenzaprine (FLEXERIL) 5 MG tablet Take 5 mg by mouth 3 (three) times daily as needed for muscle spasms.  . fluconazole (DIFLUCAN) 150 MG tablet Take 1 tablet po once. May repeat dose in 3 days as needed for persistent symptoms.  . OxyCODONE HCl, Abuse Deter, (OXAYDO) 5 MG TABA Take by mouth.  . triamcinolone ointment (KENALOG) 0.5 % Apply 1 application topically 2 (two) times daily.  . valACYclovir (VALTREX) 1000 MG tablet Take 1 tablet (1,000 mg total) by mouth 2 (two) times daily.  Marland Kitchen zolpidem (AMBIEN) 10 MG tablet Take 1 tablet (10 mg total) by mouth at bedtime as needed for sleep.  Marland Kitchen azithromycin (ZITHROMAX) 250 MG tablet z-pack - take as directed for 5 days  . oseltamivir (TAMIFLU) 75 MG capsule Take 1 capsule (75 mg total) by  mouth 2 (two) times daily.   No facility-administered encounter medications on file as of 11/27/2018.       Medical History: Past Medical History:  Diagnosis Date  . Anxiety   . Arthritis   . Cancer ([place])    left breast  . Headache    migraines  . PONV (postoperative nausea and vomiting)      Today's Vitals   11/27/18 1159  BP: 120/68  Pulse: 80  Resp: 16  Temp: (!) 100.6 F (38.1 C)  SpO2: 96%  Weight: 219 lb (99.3 kg)  Height: 5\' 4"  (1.626 m)    Review of Systems  Constitutional: Positive for chills, fatigue and fever.  HENT: Positive for congestion, ear pain, postnasal drip, rhinorrhea, sinus pain, sore throat and voice change.   Respiratory: Positive for cough and wheezing.   Cardiovascular: Negative for chest pain and palpitations.  Gastrointestinal: Negative for diarrhea, nausea and vomiting.  Musculoskeletal: Positive for arthralgias and myalgias.  Skin: Negative for rash.  Allergic/Immunologic: Negative.   Neurological: Positive for headaches.  Hematological: Positive for adenopathy.  Psychiatric/Behavioral: Negative.     Physical Exam Vitals signs and nursing note reviewed.  Constitutional:      General: She is not in acute distress.    Appearance: Normal appearance. She is well-developed. She is ill-appearing. She is not diaphoretic.  HENT:     Head: Normocephalic and atraumatic.  Ears:     Comments: External ear canals are erythema and edema.     Nose: Congestion and rhinorrhea present.     Mouth/Throat:     Pharynx: Posterior oropharyngeal erythema present. No oropharyngeal exudate.  Eyes:     Extraocular Movements: Extraocular movements intact.     Pupils: Pupils are equal, round, and reactive to light.  Neck:     Musculoskeletal: Normal range of motion and neck supple.     Thyroid: No thyromegaly.     Vascular: No JVD.     Trachea: No tracheal deviation.  Cardiovascular:     Rate and Rhythm: Normal rate and regular rhythm.     Heart  sounds: Normal heart sounds. No murmur. No friction rub. No gallop.   Pulmonary:     Effort: Pulmonary effort is normal. No respiratory distress.     Breath sounds: Wheezing present. No rales.     Comments: Dry, non-productive cough Chest:     Chest wall: No tenderness.  Abdominal:     General: Bowel sounds are normal.     Palpations: Abdomen is soft.  Musculoskeletal: Normal range of motion.  Lymphadenopathy:     Cervical: Cervical adenopathy present.  Skin:    General: Skin is warm and dry.  Neurological:     General: No focal deficit present.     Mental Status: She is alert and oriented to person, place, and time.     Cranial Nerves: No cranial nerve deficit.  Psychiatric:        Behavior: Behavior normal.        Thought Content: Thought content normal.        Judgment: Judgment normal.    Assessment/Plan:  1. Acute upper respiratory infection Start z-pack. Take as directed for 5 days. Rest and increase fluids. Continue use of OTC medications to alleviate symptoms.  - azithromycin (ZITHROMAX) 250 MG tablet; z-pack - take as directed for 5 days  Dispense: 6 tablet; Refill: 0  2. Flu-like symptoms - POCT Influenza A/B negative today, however, will treat with tamifu BID for 5 days due to severity and sudden onset of symptomsm.  - oseltamivir (TAMIFLU) 75 MG capsule; Take 1 capsule (75 mg total) by mouth 2 (two) times daily.  Dispense: 10 capsule; Refill: 0  3. Mild intermittent asthma without complication Use rescue inhaler as needed and as prescribed   General Counseling: Janet Austin verbalizes understanding of the findings of todays visit and agrees with plan of treatment. I have discussed any further diagnostic evaluation that may be needed or ordered today. We also reviewed her medications today. she has been encouraged to call the office with any questions or concerns that should arise related to todays visit.    Counseling:  Rest and increase fluids. Continue using OTC  medication to control symptoms.   This patient was seen by Leretha Pol FNP Collaboration with Dr Lavera Guise as a part of collaborative care agreement  Orders Placed This Encounter  Procedures  . POCT Influenza A/B    Meds ordered this encounter  Medications  . oseltamivir (TAMIFLU) 75 MG capsule    Sig: Take 1 capsule (75 mg total) by mouth 2 (two) times daily.    Dispense:  10 capsule    Refill:  0    Order Specific Question:   Supervising Provider    Answer:   Lavera Guise [[id number]]  . azithromycin (ZITHROMAX) 250 MG tablet    Sig: z-pack - take as directed for  5 days    Dispense:  6 tablet    Refill:  0    Order Specific Question:   Supervising Provider    Answer:   Lavera Guise [[id number]]    Time spent: 25 Minutes

## 2018-11-30 DIAGNOSIS — R6889 Other general symptoms and signs: Secondary | ICD-10-CM | POA: Insufficient documentation

## 2018-12-08 ENCOUNTER — Telehealth: Payer: Self-pay

## 2018-12-08 ENCOUNTER — Other Ambulatory Visit: Payer: Self-pay | Admitting: Nurse Practitioner

## 2018-12-08 DIAGNOSIS — J069 Acute upper respiratory infection, unspecified: Secondary | ICD-10-CM

## 2018-12-08 DIAGNOSIS — R05 Cough: Secondary | ICD-10-CM

## 2018-12-08 DIAGNOSIS — R059 Cough, unspecified: Secondary | ICD-10-CM

## 2018-12-08 MED ORDER — METHYLPREDNISOLONE 4 MG PO TBPK: ORAL_TABLET | ORAL | 0 refills | Status: DC

## 2018-12-08 MED ORDER — LEVOFLOXACIN 500 MG PO TABS: 500.0000 mg | ORAL_TABLET | Freq: Every day | ORAL | 0 refills | Status: DC

## 2018-12-08 MED ORDER — HYDROCOD POLST-CPM POLST ER 10-8 MG/5ML PO SUER: 5.0000 mL | Freq: Two times a day (BID) | ORAL | 0 refills | Status: DC | PRN

## 2018-12-08 NOTE — Telephone Encounter (Signed)
Pt was notified of all

## 2018-12-08 NOTE — Progress Notes (Signed)
Patient still feeling sick afte rfinisheing z-pack. Sent prescription for levofloxacin 500mg  daily for 10 days. Added medrol dose pack. Take as directed for 6 days. Also sent tussionex for cough. Use twice daily as needed, but only use if at home as it can cause dizziness and drowsiness.

## 2018-12-08 NOTE — Telephone Encounter (Signed)
Patient still feeling sick afte rfinisheing z-pack. Sent prescription for levofloxacin 500mg  daily for 10 days. Added medrol dose pack. Take as directed for 6 days. Also sent tussionex for cough. Use twice daily as needed, but only use if at home as it can cause dizziness and drowsiness.

## 2019-01-20 DIAGNOSIS — Z79891 Long term (current) use of opiate analgesic: Secondary | ICD-10-CM | POA: Diagnosis not present

## 2019-01-20 DIAGNOSIS — G894 Chronic pain syndrome: Secondary | ICD-10-CM | POA: Diagnosis not present

## 2019-01-20 DIAGNOSIS — Z5181 Encounter for therapeutic drug level monitoring: Secondary | ICD-10-CM | POA: Diagnosis not present

## 2019-01-20 DIAGNOSIS — G8928 Other chronic postprocedural pain: Secondary | ICD-10-CM | POA: Diagnosis not present

## 2019-01-20 DIAGNOSIS — M792 Neuralgia and neuritis, unspecified: Secondary | ICD-10-CM | POA: Diagnosis not present

## 2019-01-27 ENCOUNTER — Ambulatory Visit: Payer: Medicare Other | Admitting: Adult Health

## 2019-01-27 ENCOUNTER — Encounter: Payer: Self-pay | Admitting: Adult Health

## 2019-01-27 VITALS — BP 150/88 | HR 87 | Resp 16 | Ht 64.0 in | Wt 219.0 lb

## 2019-01-27 DIAGNOSIS — F5101 Primary insomnia: Secondary | ICD-10-CM | POA: Diagnosis not present

## 2019-01-27 DIAGNOSIS — J4520 Mild intermittent asthma, uncomplicated: Secondary | ICD-10-CM

## 2019-01-27 DIAGNOSIS — F411 Generalized anxiety disorder: Secondary | ICD-10-CM

## 2019-01-27 DIAGNOSIS — E782 Mixed hyperlipidemia: Secondary | ICD-10-CM

## 2019-01-27 MED ORDER — ZOLPIDEM TARTRATE 10 MG PO TABS: 10.0000 mg | ORAL_TABLET | Freq: Every evening | ORAL | 1 refills | Status: DC | PRN

## 2019-01-27 MED ORDER — ALPRAZOLAM 1 MG PO TABS: 1.0000 mg | ORAL_TABLET | Freq: Every evening | ORAL | 2 refills | Status: DC | PRN

## 2019-01-27 MED ORDER — ATORVASTATIN CALCIUM 10 MG PO TABS: 10.0000 mg | ORAL_TABLET | Freq: Every day | ORAL | 4 refills | Status: DC

## 2019-01-27 NOTE — Progress Notes (Signed)
[facility] [place], [place] [address]  Internal MEDICINE  Office Visit Note  Patient Name: Janet Austin  [id number]  [id number]  Date of Service: 04/01/2019  Chief Complaint  Patient presents with  . Anxiety    HPI Pt is here for follow up on insomnia, HLD, asthma and anxiety. She is in need of a refill on her xanax and her Ambien.  She reports good results using these medications.  Denies any side effects or issues. She feels that her HLD is well controlled and that her asthma is also.  Denies any issues or needs at this time, except for refills.          Current Medication: Outpatient Encounter Medications as of 01/27/2019  Medication Sig  . albuterol (PROVENTIL HFA;VENTOLIN HFA) 108 (90 Base) MCG/ACT inhaler Inhale into the lungs every 6 (six) hours as needed for wheezing or shortness of breath.  . ALPRAZolam (XANAX) 1 MG tablet Take 1 tablet (1 mg total) by mouth at bedtime as needed for anxiety.  Marland Kitchen atorvastatin (LIPITOR) 10 MG tablet Take 1 tablet (10 mg total) by mouth daily.  . clobetasol ointment (TEMOVATE) 6.94 % Apply 1 application topically daily as needed.  . cyclobenzaprine (FLEXERIL) 5 MG tablet Take 5 mg by mouth 3 (three) times daily as needed for muscle spasms.  . fluconazole (DIFLUCAN) 150 MG tablet Take 1 tablet po once. May repeat dose in 3 days as needed for persistent symptoms.  . OxyCODONE HCl, Abuse Deter, (OXAYDO) 5 MG TABA Take by mouth.  . triamcinolone ointment (KENALOG) 0.5 % Apply 1 application topically 2 (two) times daily.  . valACYclovir (VALTREX) 1000 MG tablet Take 1 tablet (1,000 mg total) by mouth 2 (two) times daily.  Marland Kitchen zolpidem (AMBIEN) 10 MG tablet Take 1 tablet (10 mg total) by mouth at bedtime as needed for sleep.  . [DISCONTINUED] ALPRAZolam (XANAX) 1 MG tablet Take 1 tablet (1 mg total) by mouth at bedtime as needed for anxiety.  . [DISCONTINUED] atorvastatin (LIPITOR) 10 MG tablet Take 1 tablet (10 mg total) by  mouth daily.  . [DISCONTINUED] zolpidem (AMBIEN) 10 MG tablet Take 1 tablet (10 mg total) by mouth at bedtime as needed for sleep.  . [DISCONTINUED] chlorpheniramine-HYDROcodone (TUSSIONEX PENNKINETIC ER) 10-8 MG/5ML SUER Take 5 mLs by mouth every 12 (twelve) hours as needed for cough. (Patient not taking: Reported on 01/27/2019)  . [DISCONTINUED] levofloxacin (LEVAQUIN) 500 MG tablet Take 1 tablet (500 mg total) by mouth daily. (Patient not taking: Reported on 01/27/2019)  . [DISCONTINUED] methylPREDNISolone (MEDROL) 4 MG TBPK tablet Take by mouth as directed for 6 days (Patient not taking: Reported on 01/27/2019)  . [DISCONTINUED] oseltamivir (TAMIFLU) 75 MG capsule Take 1 capsule (75 mg total) by mouth 2 (two) times daily. (Patient not taking: Reported on 01/27/2019)   No facility-administered encounter medications on file as of 01/27/2019.     Surgical History: Past Surgical History:  Procedure Laterality Date  . ABDOMINAL HYSTERECTOMY    . BREAST SURGERY     double mastectomy  . BREAST SURGERY     reconstruction  . COLONOSCOPY WITH PROPOFOL N/A 05/29/2016   Procedure: COLONOSCOPY WITH PROPOFOL;  Surgeon: Lollie Sails, MD;  Location[facility] ENDOSCOPY;  Service: Endoscopy;  Laterality: N/A;  . KNEE ARTHROSCOPY Right   . MASTECTOMY      Medical History: Past Medical History:  Diagnosis Date  . Anxiety   . Arthritis   . Cancer ([place])  left breast  . Headache    migraines  . PONV (postoperative nausea and vomiting)     Family History: Family History  Problem Relation Age of Onset  . Hypertension Father     Social History   Socioeconomic History  . Marital status: Married    Spouse name: Not on file  . Number of children: Not on file  . Years of education: Not on file  . Highest education level: Not on file  Occupational History  . Not on file  Social Needs  . Financial resource strain: Not on file  . Food insecurity:    Worry: Not on file    Inability: Not on  file  . Transportation needs:    Medical: Not on file    Non-medical: Not on file  Tobacco Use  . Smoking status: Never Smoker  . Smokeless tobacco: Never Used  Substance and Sexual Activity  . Alcohol use: No  . Drug use: No  . Sexual activity: Not on file  Lifestyle  . Physical activity:    Days per week: Not on file    Minutes per session: Not on file  . Stress: Not on file  Relationships  . Social connections:    Talks on phone: Not on file    Gets together: Not on file    Attends religious service: Not on file    Active member of club or organization: Not on file    Attends meetings of clubs or organizations: Not on file    Relationship status: Not on file  . Intimate partner violence:    Fear of current or ex partner: Not on file    Emotionally abused: Not on file    Physically abused: Not on file    Forced sexual activity: Not on file  Other Topics Concern  . Not on file  Social History Narrative  . Not on file      Review of Systems  Constitutional: Negative for chills, fatigue and unexpected weight change.  HENT: Negative for congestion, rhinorrhea, sneezing and sore throat.   Eyes: Negative for photophobia, pain and redness.  Respiratory: Negative for cough, chest tightness and shortness of breath.   Cardiovascular: Negative for chest pain and palpitations.  Gastrointestinal: Negative for abdominal pain, constipation, diarrhea, nausea and vomiting.  Endocrine: Negative.   Genitourinary: Negative for dysuria and frequency.  Musculoskeletal: Negative for arthralgias, back pain, joint swelling and neck pain.  Skin: Negative for rash.  Allergic/Immunologic: Negative.   Neurological: Negative for tremors and numbness.  Hematological: Negative for adenopathy. Does not bruise/bleed easily.  Psychiatric/Behavioral: Negative for behavioral problems and sleep disturbance. The patient is not nervous/anxious.     Vital Signs: BP (!) 150/88 (BP Location: Right Arm,  Patient Position: Sitting, Cuff Size: Normal)   Pulse 87   Resp 16   Ht 5\' 4"  (1.626 m)   Wt 219 lb (99.3 kg)   SpO2 96%   BMI 37.59 kg/m    Physical Exam Vitals signs and nursing note reviewed.  Constitutional:      General: She is not in acute distress.    Appearance: She is well-developed. She is not diaphoretic.  HENT:     Head: Normocephalic and atraumatic.     Mouth/Throat:     Pharynx: No oropharyngeal exudate.  Eyes:     Pupils: Pupils are equal, round, and reactive to light.  Neck:     Musculoskeletal: Normal range of motion and neck supple.  Thyroid: No thyromegaly.     Vascular: No JVD.     Trachea: No tracheal deviation.  Cardiovascular:     Rate and Rhythm: Normal rate and regular rhythm.     Heart sounds: Normal heart sounds. No murmur. No friction rub. No gallop.   Pulmonary:     Effort: Pulmonary effort is normal. No respiratory distress.     Breath sounds: Normal breath sounds. No wheezing or rales.  Chest:     Chest wall: No tenderness.  Abdominal:     Palpations: Abdomen is soft.     Tenderness: There is no abdominal tenderness. There is no guarding.  Musculoskeletal: Normal range of motion.  Lymphadenopathy:     Cervical: No cervical adenopathy.  Skin:    General: Skin is warm and dry.  Neurological:     Mental Status: She is alert and oriented to person, place, and time.     Cranial Nerves: No cranial nerve deficit.  Psychiatric:        Behavior: Behavior normal.        Thought Content: Thought content normal.        Judgment: Judgment normal.    Assessment/Plan: 1. Mild intermittent asthma without complication Stable, continue present management.   2. Generalized anxiety disorder Reviewed risks and possible side effects associated with taking opiates, benzodiazepines and other CNS depressants. Combination of these could cause dizziness and drowsiness. Advised patient not to drive or operate machinery when taking these medications, as  patient's and other's life can be at risk and will have consequences. Patient verbalized understanding in this matter. Dependence and abuse for these drugs will be monitored closely. A Controlled substance policy and procedure is on file which allows [facility] to order a urine drug screen test at any visit. Patient understands and agrees with the plan - ALPRAZolam (XANAX) 1 MG tablet; Take 1 tablet (1 mg total) by mouth at bedtime as needed for anxiety.  Dispense: 30 tablet; Refill: 2  3. Primary insomnia Refilled at this time.  Reviewed risks and possible side effects associated with taking opiates, benzodiazepines and other CNS depressants. Combination of these could cause dizziness and drowsiness. Advised patient not to drive or operate machinery when taking these medications, as patient's and other's life can be at risk and will have consequences. Patient verbalized understanding in this matter. Dependence and abuse for these drugs will be monitored closely. A Controlled substance policy and procedure is on file which allows [facility] to order a urine drug screen test at any visit. Patient understands and agrees with the plan - zolpidem (AMBIEN) 10 MG tablet; Take 1 tablet (10 mg total) by mouth at bedtime as needed for sleep.  Dispense: 90 tablet; Refill: 1  4. Mixed hyperlipidemia Refilled lipitor at her request.  - atorvastatin (LIPITOR) 10 MG tablet; Take 1 tablet (10 mg total) by mouth daily.  Dispense: 90 tablet; Refill: 4  General Counseling: Amberlie verbalizes understanding of the findings of todays visit and agrees with plan of treatment. I have discussed any further diagnostic evaluation that may be needed or ordered today. We also reviewed her medications today. she has been encouraged to call the office with any questions or concerns that should arise related to todays visit.    No orders of the defined types were placed in this encounter.   Meds ordered  this encounter  Medications  . atorvastatin (LIPITOR) 10 MG tablet    Sig: Take 1 tablet (10 mg total)  by mouth daily.    Dispense:  90 tablet    Refill:  4  . ALPRAZolam (XANAX) 1 MG tablet    Sig: Take 1 tablet (1 mg total) by mouth at bedtime as needed for anxiety.    Dispense:  30 tablet    Refill:  2    This is 90 day prescription  . zolpidem (AMBIEN) 10 MG tablet    Sig: Take 1 tablet (10 mg total) by mouth at bedtime as needed for sleep.    Dispense:  90 tablet    Refill:  1    This is 90 day prescription    Time spent: 25  Minutes   This patient was seen by Orson Gear AGNP-C in Collaboration with Dr Lavera Guise as a part of collaborative care agreement     Kendell Bane AGNP-C Internal medicine

## 2019-01-27 NOTE — Patient Instructions (Signed)

## 2019-02-04 DIAGNOSIS — M3501 Sicca syndrome with keratoconjunctivitis: Secondary | ICD-10-CM | POA: Diagnosis not present

## 2019-03-11 ENCOUNTER — Telehealth: Payer: Self-pay | Admitting: Adult Health

## 2019-03-11 MED ORDER — AZITHROMYCIN 250 MG PO TABS: ORAL_TABLET | ORAL | 0 refills | Status: DC

## 2019-03-11 NOTE — Telephone Encounter (Signed)
Spoke with patient via telephone.  She has had 8 days of Congestion, cough, sinus pain and mucous with greenish colored sputum.  She has had intermittent low grade fever with a T mmax of 100.  She reports facial pain with palpation. She recently had a course of Augmentin in December, that gave her diarrhea and upset stomach.  She is requesting a Z pak at this time.  RX for Azithromycin 250mg  PO given at this time. #6, No refill.

## 2019-04-01 ENCOUNTER — Encounter: Payer: Self-pay | Admitting: Adult Health

## 2019-04-17 ENCOUNTER — Ambulatory Visit: Payer: Medicare Other | Admitting: Nurse Practitioner

## 2019-04-17 ENCOUNTER — Encounter: Payer: Self-pay | Admitting: Nurse Practitioner

## 2019-04-17 ENCOUNTER — Other Ambulatory Visit: Payer: Self-pay

## 2019-04-17 VITALS — BP 119/75 | HR 92 | Temp 97.4°F | Resp 16 | Ht 64.0 in | Wt 216.0 lb

## 2019-04-17 DIAGNOSIS — H169 Unspecified keratitis: Secondary | ICD-10-CM | POA: Diagnosis not present

## 2019-04-17 DIAGNOSIS — R300 Dysuria: Secondary | ICD-10-CM | POA: Diagnosis not present

## 2019-04-17 DIAGNOSIS — N390 Urinary tract infection, site not specified: Secondary | ICD-10-CM | POA: Diagnosis not present

## 2019-04-17 LAB — POCT URINALYSIS DIPSTICK
Bilirubin, UA: NEGATIVE
Blood, UA: NEGATIVE
Glucose, UA: NEGATIVE
Ketones, UA: NEGATIVE
Leukocytes, UA: NEGATIVE
Nitrite, UA: NEGATIVE
Protein, UA: POSITIVE — AB
Spec Grav, UA: 1.01 (ref 1.010–1.025)
Urobilinogen, UA: 0.2 E.U./dL
pH, UA: 5 (ref 5.0–8.0)

## 2019-04-17 MED ORDER — SULFAMETHOXAZOLE-TRIMETHOPRIM 800-160 MG PO TABS: 1.0000 | ORAL_TABLET | Freq: Two times a day (BID) | ORAL | 0 refills | Status: DC

## 2019-04-17 MED ORDER — PHENAZOPYRIDINE HCL 200 MG PO TABS: 200.0000 mg | ORAL_TABLET | Freq: Three times a day (TID) | ORAL | 0 refills | Status: DC | PRN

## 2019-04-17 NOTE — Progress Notes (Signed)
[facility] [place], [place] [address]  Internal MEDICINE  Office Visit Note  Patient Name: Janet Austin  [id number]  [id number]  Date of Service: 05/04/2019   Pt is here for a sick visit.  Chief Complaint  Patient presents with  . Urinary Tract Infection     The patient states that she started having painful urination and increased in urinary frequency. States that not a lot of urine comes out, despite the need to use the bathroom more often. Feeling a lot of pressure in the bladder area. Used to get urinary tract infections often, and this feels very similar. She denies nausea or vomiting. Appetite has been ok.        Current Medication:  Outpatient Encounter Medications as of 04/17/2019  Medication Sig  . albuterol (PROVENTIL HFA;VENTOLIN HFA) 108 (90 Base) MCG/ACT inhaler Inhale into the lungs every 6 (six) hours as needed for wheezing or shortness of breath.  . clobetasol ointment (TEMOVATE) 6.14 % Apply 1 application topically daily as needed.  . cyclobenzaprine (FLEXERIL) 5 MG tablet Take 5 mg by mouth 3 (three) times daily as needed for muscle spasms.  . fluconazole (DIFLUCAN) 150 MG tablet Take 1 tablet po once. May repeat dose in 3 days as needed for persistent symptoms.  . OxyCODONE HCl, Abuse Deter, (OXAYDO) 5 MG TABA Take by mouth.  . triamcinolone ointment (KENALOG) 0.5 % Apply 1 application topically 2 (two) times daily.  . valACYclovir (VALTREX) 1000 MG tablet Take 1 tablet (1,000 mg total) by mouth 2 (two) times daily.  . [DISCONTINUED] ALPRAZolam (XANAX) 1 MG tablet Take 1 tablet (1 mg total) by mouth at bedtime as needed for anxiety.  . [DISCONTINUED] atorvastatin (LIPITOR) 10 MG tablet Take 1 tablet (10 mg total) by mouth daily.  . [DISCONTINUED] azithromycin (ZITHROMAX) 250 MG tablet Take as directed (Patient not taking: Reported on 04/30/2019)  . [DISCONTINUED] zolpidem (AMBIEN) 10 MG tablet Take 1 tablet (10 mg total) by mouth at  bedtime as needed for sleep.  . phenazopyridine (PYRIDIUM) 200 MG tablet Take 1 tablet (200 mg total) by mouth 3 (three) times daily as needed for pain.  Marland Kitchen sulfamethoxazole-trimethoprim (BACTRIM DS) 800-160 MG tablet Take 1 tablet by mouth 2 (two) times daily.   No facility-administered encounter medications on file as of 04/17/2019.       Medical History: Past Medical History:  Diagnosis Date  . Anxiety   . Arthritis   . Cancer ([place])    left breast  . Headache    migraines  . PONV (postoperative nausea and vomiting)      Today's Vitals   04/17/19 1429  BP: 119/75  Pulse: 92  Resp: 16  Temp: (!) 97.4 F (36.3 C)  SpO2: 97%  Weight: 216 lb (98 kg)  Height: 5\' 4"  (1.626 m)   Body mass index is 37.08 kg/m.  Review of Systems  Constitutional: Positive for chills and fatigue. Negative for unexpected weight change.  HENT: Negative for congestion, rhinorrhea, sneezing and sore throat.   Respiratory: Negative for cough, chest tightness and shortness of breath.   Cardiovascular: Negative for chest pain and palpitations.  Gastrointestinal: Positive for nausea. Negative for abdominal pain, constipation, diarrhea and vomiting.  Genitourinary: Positive for dysuria, frequency and urgency.  Musculoskeletal: Negative for arthralgias, back pain, joint swelling and neck pain.  Skin: Negative for rash.  Neurological: Negative for dizziness, tremors, numbness and headaches.  Hematological: Negative for adenopathy. Does not bruise/bleed easily.  Psychiatric/Behavioral:  Negative for behavioral problems (Depression), sleep disturbance and suicidal ideas. The patient is nervous/anxious.     Physical Exam Constitutional:      General: She is not in acute distress.    Appearance: Normal appearance. She is well-developed. She is not diaphoretic.  HENT:     Head: Normocephalic and atraumatic.     Mouth/Throat:     Pharynx: No oropharyngeal exudate.  Eyes:     Pupils: Pupils are equal,  round, and reactive to light.  Neck:     Musculoskeletal: Normal range of motion and neck supple.     Thyroid: No thyromegaly.     Vascular: No JVD.     Trachea: No tracheal deviation.  Cardiovascular:     Rate and Rhythm: Normal rate and regular rhythm.     Heart sounds: Normal heart sounds. No murmur. No friction rub. No gallop.   Pulmonary:     Effort: Pulmonary effort is normal. No respiratory distress.     Breath sounds: Normal breath sounds. No wheezing or rales.  Chest:     Chest wall: No tenderness.  Abdominal:     General: Bowel sounds are normal.     Palpations: Abdomen is soft.     Tenderness: There is no abdominal tenderness.  Genitourinary:    Comments: Urine sample positive for protein only today. Musculoskeletal: Normal range of motion.  Lymphadenopathy:     Cervical: No cervical adenopathy.  Skin:    General: Skin is warm and dry.  Neurological:     Mental Status: She is alert and oriented to person, place, and time.     Cranial Nerves: No cranial nerve deficit.  Psychiatric:        Behavior: Behavior normal.        Thought Content: Thought content normal.        Judgment: Judgment normal.   Assessment/Plan: 1. Urinary tract infection without hematuria, site unspecified *start bactrin Ds bid for 7 days. Send urine for culture and sensitivity and adjust antibiotics as indicated.  - sulfamethoxazole-trimethoprim (BACTRIM DS) 800-160 MG tablet; Take 1 tablet by mouth 2 (two) times daily.  Dispense: 14 tablet; Refill: 0  2. Dysuria Pyridium 200mg  ma ybe taken up to three times daily if needed for bladder pain/spasms.  - POCT Urinalysis Dipstick - phenazopyridine (PYRIDIUM) 200 MG tablet; Take 1 tablet (200 mg total) by mouth 3 (three) times daily as needed for pain.  Dispense: 10 tablet; Refill: 0 - CULTURE, URINE COMPREHENSIVE  General Counseling: Janet Austin verbalizes understanding of the findings of todays visit and agrees with plan of treatment. I have discussed  any further diagnostic evaluation that may be needed or ordered today. We also reviewed her medications today. she has been encouraged to call the office with any questions or concerns that should arise related to todays visit.    Counseling:  This patient was seen by Leretha Pol FNP Collaboration with Dr Lavera Guise as a part of collaborative care agreement  Orders Placed This Encounter  Procedures  . CULTURE, URINE COMPREHENSIVE  . POCT Urinalysis Dipstick    Meds ordered this encounter  Medications  . sulfamethoxazole-trimethoprim (BACTRIM DS) 800-160 MG tablet    Sig: Take 1 tablet by mouth 2 (two) times daily.    Dispense:  14 tablet    Refill:  0    Order Specific Question:   Supervising Provider    Answer:   Lavera Guise [[id number]]  . phenazopyridine (PYRIDIUM) 200 MG tablet  Sig: Take 1 tablet (200 mg total) by mouth 3 (three) times daily as needed for pain.    Dispense:  10 tablet    Refill:  0    Order Specific Question:   Supervising Provider    Answer:   Lavera Guise [[id number]]    Time spent: 25 Minutes

## 2019-04-20 LAB — CULTURE, URINE COMPREHENSIVE

## 2019-04-23 ENCOUNTER — Encounter: Payer: Self-pay | Admitting: Nurse Practitioner

## 2019-04-30 ENCOUNTER — Ambulatory Visit (INDEPENDENT_AMBULATORY_CARE_PROVIDER_SITE_OTHER): Payer: Medicare Other | Admitting: Adult Health

## 2019-04-30 ENCOUNTER — Other Ambulatory Visit: Payer: Self-pay

## 2019-04-30 DIAGNOSIS — F5101 Primary insomnia: Secondary | ICD-10-CM

## 2019-04-30 DIAGNOSIS — F411 Generalized anxiety disorder: Secondary | ICD-10-CM

## 2019-04-30 DIAGNOSIS — E782 Mixed hyperlipidemia: Secondary | ICD-10-CM | POA: Diagnosis not present

## 2019-04-30 MED ORDER — ALPRAZOLAM 1 MG PO TABS: 1.0000 mg | ORAL_TABLET | Freq: Every evening | ORAL | 0 refills | Status: DC | PRN

## 2019-04-30 MED ORDER — ATORVASTATIN CALCIUM 10 MG PO TABS: 10.0000 mg | ORAL_TABLET | Freq: Every day | ORAL | 2 refills | Status: DC

## 2019-04-30 MED ORDER — ZOLPIDEM TARTRATE 10 MG PO TABS: 10.0000 mg | ORAL_TABLET | Freq: Every evening | ORAL | 1 refills | Status: DC | PRN

## 2019-04-30 NOTE — Progress Notes (Signed)
[facility] [place], [place] [address]  Internal MEDICINE  Telephone Visit  Patient Name: Janet Austin  [id number]  [id number]  Date of Service: 04/30/2019  I connected with the patient at 150 by telephone and verified the patients identity using two identifiers.   I discussed the limitations, risks, security and privacy concerns of performing an evaluation and management service by telephone and the availability of in person appointments. I also discussed with the patient that there may be a patient responsible charge related to the service.  The patient expressed understanding and agrees to proceed.    Chief Complaint  Patient presents with  . Telephone Screen  . Anxiety    med refill,   . Insomnia    med refills   . Hypothyroidism  . Urinary Tract Infection    is much better   . Telephone Assessment    HPI   PT is here for follow up on anxiety, insomnia, hld, as well as recent UTI.  She reports her UTI has resolved, and she denies any symptoms at this time.  Her anxiety has been doing ok.  She denies any recent issues. She still requires ambien to sleep due to her insomnia.      Current Medication: Outpatient Encounter Medications as of 04/30/2019  Medication Sig  . albuterol (PROVENTIL HFA;VENTOLIN HFA) 108 (90 Base) MCG/ACT inhaler Inhale into the lungs every 6 (six) hours as needed for wheezing or shortness of breath.  . ALPRAZolam (XANAX) 1 MG tablet Take 1 tablet (1 mg total) by mouth at bedtime as needed for anxiety.  Marland Kitchen atorvastatin (LIPITOR) 10 MG tablet Take 1 tablet (10 mg total) by mouth daily.  . clobetasol ointment (TEMOVATE) 8.24 % Apply 1 application topically daily as needed.  . cyclobenzaprine (FLEXERIL) 5 MG tablet Take 5 mg by mouth 3 (three) times daily as needed for muscle spasms.  . fluconazole (DIFLUCAN) 150 MG tablet Take 1 tablet po once. May repeat dose in 3 days as needed for persistent symptoms.  . OxyCODONE HCl, Abuse  Deter, (OXAYDO) 5 MG TABA Take by mouth.  . phenazopyridine (PYRIDIUM) 200 MG tablet Take 1 tablet (200 mg total) by mouth 3 (three) times daily as needed for pain.  Marland Kitchen sulfamethoxazole-trimethoprim (BACTRIM DS) 800-160 MG tablet Take 1 tablet by mouth 2 (two) times daily.  Marland Kitchen triamcinolone ointment (KENALOG) 0.5 % Apply 1 application topically 2 (two) times daily.  . valACYclovir (VALTREX) 1000 MG tablet Take 1 tablet (1,000 mg total) by mouth 2 (two) times daily.  Marland Kitchen zolpidem (AMBIEN) 10 MG tablet Take 1 tablet (10 mg total) by mouth at bedtime as needed for sleep.  . [DISCONTINUED] ALPRAZolam (XANAX) 1 MG tablet Take 1 tablet (1 mg total) by mouth at bedtime as needed for anxiety.  . [DISCONTINUED] atorvastatin (LIPITOR) 10 MG tablet Take 1 tablet (10 mg total) by mouth daily.  . [DISCONTINUED] zolpidem (AMBIEN) 10 MG tablet Take 1 tablet (10 mg total) by mouth at bedtime as needed for sleep.  . [DISCONTINUED] azithromycin (ZITHROMAX) 250 MG tablet Take as directed (Patient not taking: Reported on 04/30/2019)   No facility-administered encounter medications on file as of 04/30/2019.     Surgical History: Past Surgical History:  Procedure Laterality Date  . ABDOMINAL HYSTERECTOMY    . BREAST SURGERY     double mastectomy  . BREAST SURGERY     reconstruction  . COLONOSCOPY WITH PROPOFOL N/A 05/29/2016   Procedure: COLONOSCOPY WITH PROPOFOL;  Surgeon: Lollie Sails, MD;  Location: [facility] ENDOSCOPY;  Service: Endoscopy;  Laterality: N/A;  . KNEE ARTHROSCOPY Right   . MASTECTOMY      Medical History: Past Medical History:  Diagnosis Date  . Anxiety   . Arthritis   . Cancer ([place])    left breast  . Headache    migraines  . PONV (postoperative nausea and vomiting)     Family History: Family History  Problem Relation Age of Onset  . Hypertension Father     Social History   Socioeconomic History  . Marital status: Married    Spouse name: Not on file  . Number of children: Not  on file  . Years of education: Not on file  . Highest education level: Not on file  Occupational History  . Not on file  Social Needs  . Financial resource strain: Not on file  . Food insecurity:    Worry: Not on file    Inability: Not on file  . Transportation needs:    Medical: Not on file    Non-medical: Not on file  Tobacco Use  . Smoking status: Never Smoker  . Smokeless tobacco: Never Used  Substance and Sexual Activity  . Alcohol use: No  . Drug use: No  . Sexual activity: Not on file  Lifestyle  . Physical activity:    Days per week: Not on file    Minutes per session: Not on file  . Stress: Not on file  Relationships  . Social connections:    Talks on phone: Not on file    Gets together: Not on file    Attends religious service: Not on file    Active member of club or organization: Not on file    Attends meetings of clubs or organizations: Not on file    Relationship status: Not on file  . Intimate partner violence:    Fear of current or ex partner: Not on file    Emotionally abused: Not on file    Physically abused: Not on file    Forced sexual activity: Not on file  Other Topics Concern  . Not on file  Social History Narrative  . Not on file      Review of Systems  Constitutional: Negative for chills, fatigue and unexpected weight change.  HENT: Negative for congestion, rhinorrhea, sneezing and sore throat.   Eyes: Negative for photophobia, pain and redness.  Respiratory: Negative for cough, chest tightness and shortness of breath.   Cardiovascular: Negative for chest pain and palpitations.  Gastrointestinal: Negative for abdominal pain, constipation, diarrhea, nausea and vomiting.  Endocrine: Negative.   Genitourinary: Negative for dysuria and frequency.  Musculoskeletal: Negative for arthralgias, back pain, joint swelling and neck pain.  Skin: Negative for rash.  Allergic/Immunologic: Negative.   Neurological: Negative for tremors and numbness.   Hematological: Negative for adenopathy. Does not bruise/bleed easily.  Psychiatric/Behavioral: Negative for behavioral problems and sleep disturbance. The patient is not nervous/anxious.     Vital Signs: Ht 5\' 4"  (1.626 m)   Wt 215 lb (97.5 kg)   BMI 36.90 kg/m    Observation/Objective:  Speaking in full sentences.  NAD noted.    Assessment/Plan: 1. Generalized anxiety disorder Reviewed risks and possible side effects associated with taking opiates, benzodiazepines and other CNS depressants. Combination of these could cause dizziness and drowsiness. Advised patient not to drive or operate machinery when taking these medications, as patient's and other's life can be at risk and will  have consequences. Patient verbalized understanding in this matter. Dependence and abuse for these drugs will be monitored closely. A Controlled substance policy and procedure is on file which allows [facility] to order a urine drug screen test at any visit. Patient understands and agrees with the plan - ALPRAZolam (XANAX) 1 MG tablet; Take 1 tablet (1 mg total) by mouth at bedtime as needed for anxiety.  Dispense: 90 tablet; Refill: 0  2. Primary insomnia Refilled Ambien, once again discussed safety and administration.  Encouraged patient to only take when needed.  - zolpidem (AMBIEN) 10 MG tablet; Take 1 tablet (10 mg total) by mouth at bedtime as needed for sleep.  Dispense: 90 tablet; Refill: 1  3. Mixed hyperlipidemia Refilled Lipitor, continue as directed.  - atorvastatin (LIPITOR) 10 MG tablet; Take 1 tablet (10 mg total) by mouth daily.  Dispense: 90 tablet; Refill: 2  General Counseling: Londin verbalizes understanding of the findings of today's phone visit and agrees with plan of treatment. I have discussed any further diagnostic evaluation that may be needed or ordered today. We also reviewed her medications today. she has been encouraged to call the office with any questions or  concerns that should arise related to todays visit.    No orders of the defined types were placed in this encounter.   Meds ordered this encounter  Medications  . zolpidem (AMBIEN) 10 MG tablet    Sig: Take 1 tablet (10 mg total) by mouth at bedtime as needed for sleep.    Dispense:  90 tablet    Refill:  1    This is 90 day prescription  . atorvastatin (LIPITOR) 10 MG tablet    Sig: Take 1 tablet (10 mg total) by mouth daily.    Dispense:  90 tablet    Refill:  2  . ALPRAZolam (XANAX) 1 MG tablet    Sig: Take 1 tablet (1 mg total) by mouth at bedtime as needed for anxiety.    Dispense:  90 tablet    Refill:  0    This is 90 day prescription    Time spent: 12 Minutes    Orson Gear AGNP-C Internal medicine

## 2019-05-04 DIAGNOSIS — N390 Urinary tract infection, site not specified: Secondary | ICD-10-CM | POA: Insufficient documentation

## 2019-05-05 ENCOUNTER — Other Ambulatory Visit: Payer: Self-pay | Admitting: Adult Health

## 2019-05-05 DIAGNOSIS — F411 Generalized anxiety disorder: Secondary | ICD-10-CM

## 2019-05-26 DIAGNOSIS — M792 Neuralgia and neuritis, unspecified: Secondary | ICD-10-CM | POA: Diagnosis not present

## 2019-05-26 DIAGNOSIS — G8928 Other chronic postprocedural pain: Secondary | ICD-10-CM | POA: Diagnosis not present

## 2019-05-26 DIAGNOSIS — G894 Chronic pain syndrome: Secondary | ICD-10-CM | POA: Diagnosis not present

## 2019-05-26 DIAGNOSIS — Z79891 Long term (current) use of opiate analgesic: Secondary | ICD-10-CM | POA: Diagnosis not present

## 2019-07-15 DIAGNOSIS — C50912 Malignant neoplasm of unspecified site of left female breast: Secondary | ICD-10-CM | POA: Diagnosis not present

## 2019-07-15 DIAGNOSIS — Z9011 Acquired absence of right breast and nipple: Secondary | ICD-10-CM | POA: Diagnosis not present

## 2019-08-03 ENCOUNTER — Encounter: Payer: Self-pay | Admitting: Nurse Practitioner

## 2019-08-03 ENCOUNTER — Other Ambulatory Visit: Payer: Self-pay

## 2019-08-03 ENCOUNTER — Ambulatory Visit: Payer: Medicare Other | Admitting: Adult Health

## 2019-08-03 ENCOUNTER — Ambulatory Visit (INDEPENDENT_AMBULATORY_CARE_PROVIDER_SITE_OTHER): Payer: Medicare Other | Admitting: Nurse Practitioner

## 2019-08-03 VITALS — HR 82 | Temp 98.6°F

## 2019-08-03 DIAGNOSIS — B3731 Acute candidiasis of vulva and vagina: Secondary | ICD-10-CM

## 2019-08-03 DIAGNOSIS — J4520 Mild intermittent asthma, uncomplicated: Secondary | ICD-10-CM

## 2019-08-03 DIAGNOSIS — J301 Allergic rhinitis due to pollen: Secondary | ICD-10-CM

## 2019-08-03 DIAGNOSIS — F5101 Primary insomnia: Secondary | ICD-10-CM

## 2019-08-03 DIAGNOSIS — Z0001 Encounter for general adult medical examination with abnormal findings: Secondary | ICD-10-CM | POA: Diagnosis not present

## 2019-08-03 DIAGNOSIS — F411 Generalized anxiety disorder: Secondary | ICD-10-CM

## 2019-08-03 DIAGNOSIS — B373 Candidiasis of vulva and vagina: Secondary | ICD-10-CM | POA: Diagnosis not present

## 2019-08-03 MED ORDER — ALPRAZOLAM 1 MG PO TABS: 1.0000 mg | ORAL_TABLET | Freq: Every evening | ORAL | 0 refills | Status: DC | PRN

## 2019-08-03 MED ORDER — FLUCONAZOLE 150 MG PO TABS: ORAL_TABLET | ORAL | 1 refills | Status: DC

## 2019-08-03 MED ORDER — ZOLPIDEM TARTRATE 10 MG PO TABS: 10.0000 mg | ORAL_TABLET | Freq: Every evening | ORAL | 1 refills | Status: DC | PRN

## 2019-08-03 NOTE — Progress Notes (Signed)
[facility] [place], [place] [address]  Internal MEDICINE  Telephone Visit  Patient Name: Janet Austin  [id number]  [id number]  Date of Service: 08/03/2019  I connected with the patient at 4:25pm  by webcam and verified the patients identity using two identifiers.   I discussed the limitations, risks, security and privacy concerns of performing an evaluation and management service by webcam  and the availability of in person appointments. I also discussed with the patient that there may be a patient responsible charge related to the service.  The patient expressed understanding and agrees to proceed.    Chief Complaint  Patient presents with  . Telephone Assessment  . Telephone Screen  . Cough    dry cough for 3 days   . Medication Refill    diflucan, ambien and xanax (90 day refill)  . Nasal Congestion    starting to get into chest  . Anxiety  . Arthritis    The patient has been contacted via webcam for follow up visit due to concerns for spread of novel coronavirus. She presents for health maintenance exam. The patient states that she has a bit of a cold. She has nasal congestion, headache, and scratchy throat. She has dry cough. Air conditioning at home has been out for about six days and this is making problem worse. She denies fever, nausea, or vomiting. Her air conditioning is scheduled to be fixed tomorrow. She has started walking. She does have generalized joint pain. Sees pain management provider. She has anxiety and difficulty sleeping. She takes alprazolam and ambien as needed. She needs to have refills for this.  She continues to have yeast infection. She took tow rounds of antibiotics and since then she has had yeast infection which she cannot get rid of.       Current Medication: Outpatient Encounter Medications as of 08/03/2019  Medication Sig  . albuterol (PROVENTIL HFA;VENTOLIN HFA) 108 (90 Base) MCG/ACT inhaler Inhale into the lungs  every 6 (six) hours as needed for wheezing or shortness of breath.  . ALPRAZolam (XANAX) 1 MG tablet Take 1 tablet (1 mg total) by mouth at bedtime as needed for anxiety.  Marland Kitchen atorvastatin (LIPITOR) 10 MG tablet Take 1 tablet (10 mg total) by mouth daily.  . clobetasol ointment (TEMOVATE) 9.52 % Apply 1 application topically daily as needed.  . cyclobenzaprine (FLEXERIL) 5 MG tablet Take 5 mg by mouth 3 (three) times daily as needed for muscle spasms.  . fluconazole (DIFLUCAN) 150 MG tablet Take 1 tablet po once. May repeat dose in 3 days as needed for persistent symptoms.  . OxyCODONE HCl, Abuse Deter, (OXAYDO) 5 MG TABA Take by mouth.  . triamcinolone ointment (KENALOG) 0.5 % Apply 1 application topically 2 (two) times daily.  . valACYclovir (VALTREX) 1000 MG tablet Take 1 tablet (1,000 mg total) by mouth 2 (two) times daily.  Marland Kitchen zolpidem (AMBIEN) 10 MG tablet Take 1 tablet (10 mg total) by mouth at bedtime as needed for sleep.  . [DISCONTINUED] ALPRAZolam (XANAX) 1 MG tablet Take 1 tablet (1 mg total) by mouth at bedtime as needed for anxiety.  . [DISCONTINUED] fluconazole (DIFLUCAN) 150 MG tablet Take 1 tablet po once. May repeat dose in 3 days as needed for persistent symptoms.  . [DISCONTINUED] zolpidem (AMBIEN) 10 MG tablet Take 1 tablet (10 mg total) by mouth at bedtime as needed for sleep.  . [DISCONTINUED] phenazopyridine (PYRIDIUM) 200 MG tablet Take 1 tablet (200 mg total)  by mouth 3 (three) times daily as needed for pain. (Patient not taking: Reported on 08/03/2019)  . [DISCONTINUED] sulfamethoxazole-trimethoprim (BACTRIM DS) 800-160 MG tablet Take 1 tablet by mouth 2 (two) times daily. (Patient not taking: Reported on 08/03/2019)   No facility-administered encounter medications on file as of 08/03/2019.     Surgical History: Past Surgical History:  Procedure Laterality Date  . ABDOMINAL HYSTERECTOMY    . BREAST SURGERY     double mastectomy  . BREAST SURGERY     reconstruction  .  COLONOSCOPY WITH PROPOFOL N/A 05/29/2016   Procedure: COLONOSCOPY WITH PROPOFOL;  Surgeon: Lollie Sails, MD;  Location: [facility] ENDOSCOPY;  Service: Endoscopy;  Laterality: N/A;  . KNEE ARTHROSCOPY Right   . MASTECTOMY      Medical History: Past Medical History:  Diagnosis Date  . Anxiety   . Arthritis   . Cancer ([place])    left breast  . Headache    migraines  . PONV (postoperative nausea and vomiting)     Family History: Family History  Problem Relation Age of Onset  . Hypertension Father     Social History   Socioeconomic History  . Marital status: Married    Spouse name: Not on file  . Number of children: Not on file  . Years of education: Not on file  . Highest education level: Not on file  Occupational History  . Not on file  Social Needs  . Financial resource strain: Not on file  . Food insecurity    Worry: Not on file    Inability: Not on file  . Transportation needs    Medical: Not on file    Non-medical: Not on file  Tobacco Use  . Smoking status: Never Smoker  . Smokeless tobacco: Never Used  Substance and Sexual Activity  . Alcohol use: No  . Drug use: No  . Sexual activity: Not on file  Lifestyle  . Physical activity    Days per week: Not on file    Minutes per session: Not on file  . Stress: Not on file  Relationships  . Social Herbalist on phone: Not on file    Gets together: Not on file    Attends religious service: Not on file    Active member of club or organization: Not on file    Attends meetings of clubs or organizations: Not on file    Relationship status: Not on file  . Intimate partner violence    Fear of current or ex partner: Not on file    Emotionally abused: Not on file    Physically abused: Not on file    Forced sexual activity: Not on file  Other Topics Concern  . Not on file  Social History Narrative  . Not on file      Review of Systems  Constitutional: Positive for fatigue. Negative for chills and  unexpected weight change.  HENT: Positive for congestion and sinus pressure. Negative for rhinorrhea, sneezing and sore throat.   Respiratory: Positive for cough. Negative for chest tightness and shortness of breath.   Cardiovascular: Negative for chest pain and palpitations.  Gastrointestinal: Negative for abdominal pain, constipation, diarrhea, nausea and vomiting.  Endocrine: Negative for cold intolerance, heat intolerance, polydipsia and polyuria.  Genitourinary: Positive for vaginal discharge. Negative for dysuria, frequency and urgency.       Vaginal discharge and irritation since being on antibiotics.   Musculoskeletal: Positive for back pain. Negative for arthralgias,  joint swelling and neck pain.  Skin: Negative for rash.  Allergic/Immunologic: Positive for environmental allergies.  Neurological: Positive for headaches. Negative for dizziness, tremors and numbness.  Hematological: Negative for adenopathy. Does not bruise/bleed easily.  Psychiatric/Behavioral: Positive for sleep disturbance. Negative for behavioral problems (Depression) and suicidal ideas. The patient is nervous/anxious.    Today's Vitals   08/03/19 1545  Pulse: 82  Temp: 98.6 F (37 C)  SpO2: 97%   There is no height or weight on file to calculate BMI.  Observation/Objective:    The patient is alert and oriented. She is pleasant and answers all questions appropriately. Breathing is non-labored. She is in no acute distress at this time.  The patient is nasally congested.    Depression screen [facility] 2/9 04/30/2019 04/17/2019 01/27/2019 11/27/2018 11/03/2018  Decreased Interest 0 0 0 0 0  Down, Depressed, Hopeless 0 0 0 - 0  PHQ - 2 Score 0 0 0 0 0    Functional Status Survey: Is the patient deaf or have difficulty hearing?: No Does the patient have difficulty seeing, even when wearing glasses/contacts?: No Does the patient have difficulty concentrating, remembering, or making decisions?: No Does the patient  have difficulty walking or climbing stairs?: No Does the patient have difficulty dressing or bathing?: No Does the patient have difficulty doing errands alone such as visiting a doctor's office or shopping?: No  MMSE - [place] Exam 08/03/2019  Orientation to time 5  Orientation to Place 5  Registration 3  Attention/ Calculation 5  Recall 3  Language- name 2 objects 2  Language- repeat 1  Language- follow 3 step command 3  Language- read & follow direction 1  Write a sentence 1  Copy design 1  Total score 30    Fall Risk  04/30/2019 04/17/2019 01/27/2019 11/27/2018 11/03/2018  Falls in the past year? 0 0 1 0 0  Number falls in past yr: 0 0 0 0 -  Injury with Fall? - 0 0 0 -     Assessment/Plan: 1. Encounter for general adult medical examination with abnormal findings Annual wellness visit today. Will get breast exam and pap smear at next, in-office visit.   2. Seasonal allergic rhinitis due to pollen OTC allergy medication daily. Patient to notify office if symptoms worsen or fever develops.   3. Mild intermittent asthma without complication Use rescue inhaler as needed and as prescribed.   4. Vaginal candida Diflucan as needed and as prescribed. Recommended she treat with OTC vaginal yeast infection treatment simultaneouslyfor better results.  - fluconazole (DIFLUCAN) 150 MG tablet; Take 1 tablet po once. May repeat dose in 3 days as needed for persistent symptoms.  Dispense: 5 tablet; Refill: 1  5. Generalized anxiety disorder May take alprazolam 1mg  daily as needed for acute anxiety.  - ALPRAZolam (XANAX) 1 MG tablet; Take 1 tablet (1 mg total) by mouth at bedtime as needed for anxiety.  Dispense: 90 tablet; Refill: 0  6. Primary insomnia May continue to take ambien at night as needed for insomnia . - zolpidem (AMBIEN) 10 MG tablet; Take 1 tablet (10 mg total) by mouth at bedtime as needed for sleep.  Dispense: 90 tablet; Refill: 1  General Counseling: Rebecah  verbalizes understanding of the findings of today's phone visit and agrees with plan of treatment. I have discussed any further diagnostic evaluation that may be needed or ordered today. We also reviewed her medications today. she has been encouraged to call the office with any  questions or concerns that should arise related to todays visit.  This patient was seen by [place] with Dr Lavera Guise as a part of collaborative care agreement   Meds ordered this encounter  Medications  . fluconazole (DIFLUCAN) 150 MG tablet    Sig: Take 1 tablet po once. May repeat dose in 3 days as needed for persistent symptoms.    Dispense:  5 tablet    Refill:  1    Order Specific Question:   Supervising Provider    Answer:   Lavera Guise [[id number]]  . zolpidem (AMBIEN) 10 MG tablet    Sig: Take 1 tablet (10 mg total) by mouth at bedtime as needed for sleep.    Dispense:  90 tablet    Refill:  1    This is 90 day prescription    Order Specific Question:   Supervising Provider    Answer:   Lavera Guise [[id number]]  . ALPRAZolam (XANAX) 1 MG tablet    Sig: Take 1 tablet (1 mg total) by mouth at bedtime as needed for anxiety.    Dispense:  90 tablet    Refill:  0    This is 90 day prescription    Order Specific Question:   Supervising Provider    Answer:   Lavera Guise [[id number]]    Time spent: 70 Minutes    Dr Lavera Guise Internal medicine

## 2019-08-17 ENCOUNTER — Telehealth: Payer: Self-pay

## 2019-08-17 ENCOUNTER — Other Ambulatory Visit: Payer: Self-pay | Admitting: Nurse Practitioner

## 2019-08-17 ENCOUNTER — Other Ambulatory Visit: Payer: Self-pay

## 2019-08-17 DIAGNOSIS — J069 Acute upper respiratory infection, unspecified: Secondary | ICD-10-CM

## 2019-08-17 DIAGNOSIS — B3731 Acute candidiasis of vulva and vagina: Secondary | ICD-10-CM

## 2019-08-17 DIAGNOSIS — B373 Candidiasis of vulva and vagina: Secondary | ICD-10-CM

## 2019-08-17 DIAGNOSIS — R05 Cough: Secondary | ICD-10-CM

## 2019-08-17 DIAGNOSIS — R059 Cough, unspecified: Secondary | ICD-10-CM

## 2019-08-17 MED ORDER — PREDNISONE 10 MG (21) PO TBPK: ORAL_TABLET | ORAL | 0 refills | Status: DC

## 2019-08-17 MED ORDER — FLUCONAZOLE 150 MG PO TABS: ORAL_TABLET | ORAL | 1 refills | Status: DC

## 2019-08-17 MED ORDER — AZITHROMYCIN 250 MG PO TABS: ORAL_TABLET | ORAL | 0 refills | Status: DC

## 2019-08-17 NOTE — Telephone Encounter (Signed)
Sent z-pack and prednisone taper t [facility]. X-pack for 5 days and prednisone for six days. Start x-pack tonight and prednisone tomorrow morning.

## 2019-08-17 NOTE — Telephone Encounter (Signed)
PT ADVISED WE SEND Z-PAK  AND PREDNISONE TO [facility] FOR SINUS INFECTION

## 2019-08-17 NOTE — Progress Notes (Signed)
Sent z-pack and prednisone taper t [facility]. X-pack for 5 days and prednisone for six days. Start x-pack tonight and prednisone tomorrow morning.

## 2019-09-01 DIAGNOSIS — G8928 Other chronic postprocedural pain: Secondary | ICD-10-CM | POA: Diagnosis not present

## 2019-09-01 DIAGNOSIS — G894 Chronic pain syndrome: Secondary | ICD-10-CM | POA: Diagnosis not present

## 2019-09-01 DIAGNOSIS — Z79891 Long term (current) use of opiate analgesic: Secondary | ICD-10-CM | POA: Diagnosis not present

## 2019-09-01 DIAGNOSIS — M792 Neuralgia and neuritis, unspecified: Secondary | ICD-10-CM | POA: Diagnosis not present

## 2019-09-11 ENCOUNTER — Ambulatory Visit: Payer: Medicare Other | Admitting: Nurse Practitioner

## 2019-09-11 ENCOUNTER — Encounter: Payer: Self-pay | Admitting: Nurse Practitioner

## 2019-09-11 ENCOUNTER — Other Ambulatory Visit: Payer: Self-pay

## 2019-09-11 VITALS — HR 110 | Temp 97.8°F | Wt 224.0 lb

## 2019-09-11 DIAGNOSIS — R05 Cough: Secondary | ICD-10-CM | POA: Diagnosis not present

## 2019-09-11 DIAGNOSIS — J4520 Mild intermittent asthma, uncomplicated: Secondary | ICD-10-CM | POA: Diagnosis not present

## 2019-09-11 DIAGNOSIS — J069 Acute upper respiratory infection, unspecified: Secondary | ICD-10-CM

## 2019-09-11 DIAGNOSIS — R059 Cough, unspecified: Secondary | ICD-10-CM

## 2019-09-11 MED ORDER — AZITHROMYCIN 250 MG PO TABS: ORAL_TABLET | ORAL | 0 refills | Status: DC

## 2019-09-11 MED ORDER — PREDNISONE 10 MG (21) PO TBPK: ORAL_TABLET | ORAL | 0 refills | Status: DC

## 2019-09-11 NOTE — Progress Notes (Signed)
[facility] [place], [place] [address]  Internal MEDICINE  Telephone Visit  Patient Name: Janet Austin  [id number]  [id number]  Date of Service: 09/23/2019  I connected with the patient at 12:19pm by webcam and verified the patients identity using two identifiers.   I discussed the limitations, risks, security and privacy concerns of performing an evaluation and management service by webcam and the availability of in person appointments. I also discussed with the patient that there may be a patient responsible charge related to the service.  The patient expressed understanding and agrees to proceed.    Chief Complaint  Patient presents with  . Telephone Assessment    pt husband has cancer and she does not want to get sick  . Telephone Screen  . Cough    continuing  . URI    The patient has been contacted via webcam for follow up visit due to concerns for spread of novel coronavirus. The patient is complaining of cough, sore throat, and nasal congestion. She is worried about being sick as her husband was recently diagnosed with rare cancer. He will be starting in-patient chemotherapy treatment very soon. She has not been exposed to anyone with COVID 19 as she and her husband have been carefully quanratining due to his new diagnosis.       Current Medication: Outpatient Encounter Medications as of 09/11/2019  Medication Sig  . albuterol (PROVENTIL HFA;VENTOLIN HFA) 108 (90 Base) MCG/ACT inhaler Inhale into the lungs every 6 (six) hours as needed for wheezing or shortness of breath.  . ALPRAZolam (XANAX) 1 MG tablet Take 1 tablet (1 mg total) by mouth at bedtime as needed for anxiety.  Marland Kitchen atorvastatin (LIPITOR) 10 MG tablet Take 1 tablet (10 mg total) by mouth daily.  Marland Kitchen azithromycin (ZITHROMAX) 250 MG tablet Take 2 tablets po day one then take 1 tablet dally from days 2 through 10 for upper respiratory infection.  . clobetasol ointment (TEMOVATE) [id number] % Apply  1 application topically daily as needed.  . cyclobenzaprine (FLEXERIL) 5 MG tablet Take 5 mg by mouth 3 (three) times daily as needed for muscle spasms.  . fluconazole (DIFLUCAN) 150 MG tablet Take 1 tablet po once. May repeat dose in 3 days as needed for persistent symptoms.  . OxyCODONE HCl, Abuse Deter, (OXAYDO) 5 MG TABA Take by mouth.  . predniSONE (STERAPRED UNI-PAK 21 TAB) 10 MG (21) TBPK tablet 6 day taper - take by mouth as directed for 6 days  . triamcinolone ointment (KENALOG) 0.5 % Apply 1 application topically 2 (two) times daily.  . valACYclovir (VALTREX) 1000 MG tablet Take 1 tablet (1,000 mg total) by mouth 2 (two) times daily.  Marland Kitchen zolpidem (AMBIEN) 10 MG tablet Take 1 tablet (10 mg total) by mouth at bedtime as needed for sleep.  . [DISCONTINUED] azithromycin (ZITHROMAX) 250 MG tablet z-pack - take as directed for 5 days  . [DISCONTINUED] predniSONE (STERAPRED UNI-PAK 21 TAB) 10 MG (21) TBPK tablet 6 day taper - take by mouth as directed for 6 days   No facility-administered encounter medications on file as of 09/11/2019.     Surgical History: Past Surgical History:  Procedure Laterality Date  . ABDOMINAL HYSTERECTOMY    . BREAST SURGERY     double mastectomy  . BREAST SURGERY     reconstruction  . COLONOSCOPY WITH PROPOFOL N/A 05/29/2016   Procedure: COLONOSCOPY WITH PROPOFOL;  Surgeon: Lollie Sails, MD;  Location[facility] ENDOSCOPY;  Service:  Endoscopy;  Laterality: N/A;  . KNEE ARTHROSCOPY Right   . MASTECTOMY      Medical History: Past Medical History:  Diagnosis Date  . Anxiety   . Arthritis   . Cancer ([place])    left breast  . Headache    migraines  . PONV (postoperative nausea and vomiting)     Family History: Family History  Problem Relation Age of Onset  . Hypertension Father     Social History   Socioeconomic History  . Marital status: Married    Spouse name: Not on file  . Number of children: Not on file  . Years of education: Not on file   . Highest education level: Not on file  Occupational History  . Not on file  Social Needs  . Financial resource strain: Not on file  . Food insecurity    Worry: Not on file    Inability: Not on file  . Transportation needs    Medical: Not on file    Non-medical: Not on file  Tobacco Use  . Smoking status: Never Smoker  . Smokeless tobacco: Never Used  Substance and Sexual Activity  . Alcohol use: No  . Drug use: No  . Sexual activity: Not on file  Lifestyle  . Physical activity    Days per week: Not on file    Minutes per session: Not on file  . Stress: Not on file  Relationships  . Social Herbalist on phone: Not on file    Gets together: Not on file    Attends religious service: Not on file    Active member of club or organization: Not on file    Attends meetings of clubs or organizations: Not on file    Relationship status: Not on file  . Intimate partner violence    Fear of current or ex partner: Not on file    Emotionally abused: Not on file    Physically abused: Not on file    Forced sexual activity: Not on file  Other Topics Concern  . Not on file  Social History Narrative  . Not on file      Review of Systems  Constitutional: Positive for chills and fatigue. Negative for fever.  HENT: Positive for congestion, postnasal drip, rhinorrhea, sinus pressure, sinus pain, sore throat and voice change. Negative for ear pain.   Respiratory: Positive for cough and wheezing.   Cardiovascular: Negative for chest pain and palpitations.  Gastrointestinal: Negative for nausea.  Musculoskeletal: Positive for arthralgias and myalgias.  Allergic/Immunologic: Negative.   Neurological: Positive for headaches.  Hematological: Positive for adenopathy.   Today's Vitals   09/11/19 1153  Pulse: (!) 110  Temp: 97.8 F (36.6 C)  SpO2: 96%  Weight: 224 lb (101.6 kg)   Body mass index is 38.45 kg/m.   Observation/Objective:   The patient is alert and  oriented. She is pleasant and answers all questions appropriately. Breathing is non-labored. She is in no acute distress at this time.  The patient isnasally congested. She appears to be ill.    Assessment/Plan:  1. Acute upper respiratory infection zithormax 250mg  - take 2 tablets on day one then take one tablets on dayss two through 10.  - azithromycin (ZITHROMAX) 250 MG tablet; Take 2 tablets po day one then take 1 tablet dally from days 2 through 10 for upper respiratory infection.  Dispense: 11 tablet; Refill: 0  2. Cough Prednisone dose pack - take as directed for  6 days. OTC cough suppressant recommended.  - predniSONE (STERAPRED UNI-PAK 21 TAB) 10 MG (21) TBPK tablet; 6 day taper - take by mouth as directed for 6 days  Dispense: 21 tablet; Refill: 0  3. Mild intermittent asthma without complication Use rescue inhaler as needed and as prescribed   General Counseling: Kalea verbalizes understanding of the findings of today's phone visit and agrees with plan of treatment. I have discussed any further diagnostic evaluation that may be needed or ordered today. We also reviewed her medications today. she has been encouraged to call the office with any questions or concerns that should arise related to todays visit.   This patient was seen by [place] with Dr Lavera Guise as a part of collaborative care agreement  Meds ordered this encounter  Medications  . predniSONE (STERAPRED UNI-PAK 21 TAB) 10 MG (21) TBPK tablet    Sig: 6 day taper - take by mouth as directed for 6 days    Dispense:  21 tablet    Refill:  0    Order Specific Question:   Supervising Provider    Answer:   Lavera Guise [place]  . azithromycin (ZITHROMAX) 250 MG tablet    Sig: Take 2 tablets po day one then take 1 tablet dally from days 2 through 10 for upper respiratory infection.    Dispense:  11 tablet    Refill:  0    Order Specific Question:   Supervising Provider    Answer:   Lavera Guise [id number]    Time spent: 51 Minutes    Dr Lavera Guise Internal medicine

## 2019-09-23 DIAGNOSIS — R059 Cough, unspecified: Secondary | ICD-10-CM | POA: Insufficient documentation

## 2019-09-23 DIAGNOSIS — R05 Cough: Secondary | ICD-10-CM | POA: Insufficient documentation

## 2019-11-05 ENCOUNTER — Encounter: Payer: Self-pay | Admitting: Nurse Practitioner

## 2019-11-05 ENCOUNTER — Other Ambulatory Visit: Payer: Self-pay

## 2019-11-05 ENCOUNTER — Ambulatory Visit: Payer: Medicare Other | Admitting: Nurse Practitioner

## 2019-11-05 VITALS — BP 128/76 | HR 83 | Temp 97.6°F | Resp 16 | Ht 64.0 in | Wt 225.0 lb

## 2019-11-05 DIAGNOSIS — J4520 Mild intermittent asthma, uncomplicated: Secondary | ICD-10-CM | POA: Diagnosis not present

## 2019-11-05 DIAGNOSIS — F411 Generalized anxiety disorder: Secondary | ICD-10-CM | POA: Diagnosis not present

## 2019-11-05 DIAGNOSIS — F5101 Primary insomnia: Secondary | ICD-10-CM | POA: Diagnosis not present

## 2019-11-05 DIAGNOSIS — Z79899 Other long term (current) drug therapy: Secondary | ICD-10-CM | POA: Diagnosis not present

## 2019-11-05 DIAGNOSIS — Z0001 Encounter for general adult medical examination with abnormal findings: Secondary | ICD-10-CM

## 2019-11-05 DIAGNOSIS — R300 Dysuria: Secondary | ICD-10-CM

## 2019-11-05 DIAGNOSIS — Z124 Encounter for screening for malignant neoplasm of cervix: Secondary | ICD-10-CM

## 2019-11-05 LAB — POCT URINE DRUG SCREEN
POC Amphetamine UR: NOT DETECTED
POC BENZODIAZEPINES UR: POSITIVE — AB
POC Barbiturate UR: NOT DETECTED
POC Cocaine UR: NOT DETECTED
POC Ecstasy UR: NOT DETECTED
POC Marijuana UR: NOT DETECTED
POC Methadone UR: NOT DETECTED
POC Methamphetamine UR: NOT DETECTED
POC Opiate Ur: NOT DETECTED
POC Oxycodone UR: NOT DETECTED
POC PHENCYCLIDINE UR: NOT DETECTED
POC TRICYCLICS UR: NOT DETECTED

## 2019-11-05 MED ORDER — ALBUTEROL SULFATE HFA 108 (90 BASE) MCG/ACT IN AERS: 2.0000 | INHALATION_SPRAY | Freq: Four times a day (QID) | RESPIRATORY_TRACT | 3 refills | Status: DC | PRN

## 2019-11-05 MED ORDER — ALPRAZOLAM 1 MG PO TABS: 1.0000 mg | ORAL_TABLET | Freq: Every evening | ORAL | 0 refills | Status: DC | PRN

## 2019-11-05 MED ORDER — ZOLPIDEM TARTRATE 10 MG PO TABS: 10.0000 mg | ORAL_TABLET | Freq: Every evening | ORAL | 1 refills | Status: DC | PRN

## 2019-11-05 NOTE — Progress Notes (Signed)
[facility] [place], [place] [address]  Internal MEDICINE  Office Visit Note  Patient Name: Janet Austin  [id number]  [id number]  Date of Service: 11/15/2019   Pt is here for routine health maintenance examination   Chief Complaint  Patient presents with  . Anxiety  . Arthritis     The patient is here for health maintenance exam and pap smear. She is having increased family stress. Her husband is very ill with myelofibrosis. Currently taking chemotherapy treatments. She is overdue for pap smear which we will do today. She has had breast cancer with bilateral mastectomy. She has not had ultrasound of the remaining breast tissue in some time .She would like to wait until fear of COVID 19 decreases.   Current Medication: Outpatient Encounter Medications as of 11/05/2019  Medication Sig  . albuterol (VENTOLIN HFA) 108 (90 Base) MCG/ACT inhaler Inhale 2 puffs into the lungs every 6 (six) hours as needed for wheezing or shortness of breath.  . ALPRAZolam (XANAX) 1 MG tablet Take 1 tablet (1 mg total) by mouth at bedtime as needed for anxiety.  Marland Kitchen atorvastatin (LIPITOR) 10 MG tablet Take 1 tablet (10 mg total) by mouth daily.  . clobetasol ointment (TEMOVATE) [id number] % Apply 1 application topically daily as needed.  . cyclobenzaprine (FLEXERIL) 5 MG tablet Take 5 mg by mouth 3 (three) times daily as needed for muscle spasms.  . OxyCODONE HCl, Abuse Deter, (OXAYDO) 5 MG TABA Take by mouth.  . triamcinolone ointment (KENALOG) 0.5 % Apply 1 application topically 2 (two) times daily.  . valACYclovir (VALTREX) 1000 MG tablet Take 1 tablet (1,000 mg total) by mouth 2 (two) times daily.  Marland Kitchen zolpidem (AMBIEN) 10 MG tablet Take 1 tablet (10 mg total) by mouth at bedtime as needed for sleep.  . [DISCONTINUED] albuterol (PROVENTIL HFA;VENTOLIN HFA) 108 (90 Base) MCG/ACT inhaler Inhale into the lungs every 6 (six) hours as needed for wheezing or shortness of breath.  .  [DISCONTINUED] ALPRAZolam (XANAX) 1 MG tablet Take 1 tablet (1 mg total) by mouth at bedtime as needed for anxiety.  . [DISCONTINUED] zolpidem (AMBIEN) 10 MG tablet Take 1 tablet (10 mg total) by mouth at bedtime as needed for sleep.  . [DISCONTINUED] azithromycin (ZITHROMAX) 250 MG tablet Take 2 tablets po day one then take 1 tablet dally from days 2 through 10 for upper respiratory infection. (Patient not taking: Reported on 11/05/2019)  . [DISCONTINUED] fluconazole (DIFLUCAN) 150 MG tablet Take 1 tablet po once. May repeat dose in 3 days as needed for persistent symptoms. (Patient not taking: Reported on 11/05/2019)  . [DISCONTINUED] predniSONE (STERAPRED UNI-PAK 21 TAB) 10 MG (21) TBPK tablet 6 day taper - take by mouth as directed for 6 days (Patient not taking: Reported on 11/05/2019)   No facility-administered encounter medications on file as of 11/05/2019.     Surgical History: Past Surgical History:  Procedure Laterality Date  . ABDOMINAL HYSTERECTOMY    . BREAST SURGERY     double mastectomy  . BREAST SURGERY     reconstruction  . COLONOSCOPY WITH PROPOFOL N/A 05/29/2016   Procedure: COLONOSCOPY WITH PROPOFOL;  Surgeon: Lollie Sails, MD;  Location: [facility] ENDOSCOPY;  Service: Endoscopy;  Laterality: N/A;  . KNEE ARTHROSCOPY Right   . MASTECTOMY      Medical History: Past Medical History:  Diagnosis Date  . Anxiety   . Arthritis   . Cancer [facility])    left breast  . Headache  migraines  . PONV (postoperative nausea and vomiting)     Family History: Family History  Problem Relation Age of Onset  . Hypertension Father       Review of Systems  Constitutional: Negative for activity change, chills, fatigue and unexpected weight change.  HENT: Negative for congestion, postnasal drip, rhinorrhea, sneezing and sore throat.   Respiratory: Negative for cough, chest tightness, shortness of breath and wheezing.   Cardiovascular: Negative for chest pain and palpitations.   Gastrointestinal: Negative for abdominal pain, constipation, diarrhea, nausea and vomiting.  Endocrine: Negative for cold intolerance, heat intolerance, polydipsia and polyuria.  Genitourinary: Negative for dysuria, frequency and urgency.  Musculoskeletal: Positive for arthralgias and myalgias. Negative for back pain, joint swelling and neck pain.       The patient sees pain management provider   Skin: Negative for rash.  Allergic/Immunologic: Positive for environmental allergies.  Neurological: Negative for dizziness, tremors, numbness and headaches.  Hematological: Negative for adenopathy. Does not bruise/bleed easily.  Psychiatric/Behavioral: Positive for sleep disturbance. Negative for behavioral problems (Depression) and suicidal ideas. The patient is nervous/anxious.      Today's Vitals   11/05/19 1350  BP: 128/76  Pulse: 83  Resp: 16  Temp: 97.6 F (36.4 C)  SpO2: 97%  Weight: 225 lb (102.1 kg)  Height: 5\' 4"  (1.626 m)   Body mass index is 38.62 kg/m.  Physical Exam Vitals signs and nursing note reviewed.  Constitutional:      General: She is not in acute distress.    Appearance: Normal appearance. She is well-developed. She is not diaphoretic.  HENT:     Head: Normocephalic and atraumatic.     Nose: Nose normal.     Mouth/Throat:     Pharynx: No oropharyngeal exudate.  Eyes:     Pupils: Pupils are equal, round, and reactive to light.  Neck:     Musculoskeletal: Normal range of motion and neck supple.     Thyroid: No thyromegaly.     Vascular: No JVD.     Trachea: No tracheal deviation.  Cardiovascular:     Rate and Rhythm: Normal rate and regular rhythm.     Pulses: Normal pulses.     Heart sounds: Normal heart sounds. No murmur. No friction rub. No gallop.   Pulmonary:     Effort: Pulmonary effort is normal. No respiratory distress.     Breath sounds: Normal breath sounds. No wheezing or rales.  Chest:     Chest wall: No tenderness.  Abdominal:      General: Bowel sounds are normal.     Palpations: Abdomen is soft.     Tenderness: There is no abdominal tenderness.  Genitourinary:    General: Normal vulva.     Exam position: Supine.     Labia:        Right: No tenderness.        Left: No tenderness.      Vagina: Normal. No vaginal discharge or erythema.     Cervix: No discharge, friability or erythema.     Uterus: Absent.      Adnexa: Right adnexa normal and left adnexa normal.     Comments: No tenderness, masses, or organomeglay present during bimanual exam . Musculoskeletal: Normal range of motion.  Lymphadenopathy:     Cervical: No cervical adenopathy.     Lower Body: No right inguinal adenopathy. No left inguinal adenopathy.  Skin:    General: Skin is warm and dry.  Neurological:  Mental Status: She is alert and oriented to person, place, and time.     Cranial Nerves: No cranial nerve deficit.  Psychiatric:        Attention and Perception: Attention and perception normal.        Mood and Affect: Affect normal. Mood is anxious.        Speech: Speech normal.        Behavior: Behavior normal. Behavior is cooperative.        Thought Content: Thought content normal.        Cognition and Memory: Cognition and memory normal.        Judgment: Judgment normal.       Depression screen [facility] 2/9 11/05/2019 04/30/2019 04/17/2019 01/27/2019 11/27/2018  Decreased Interest 0 0 0 0 0  Down, Depressed, Hopeless 0 0 0 0 -  PHQ - 2 Score 0 0 0 0 0    Functional Status Survey: Is the patient deaf or have difficulty hearing?: No Does the patient have difficulty seeing, even when wearing glasses/contacts?: No Does the patient have difficulty concentrating, remembering, or making decisions?: No Does the patient have difficulty walking or climbing stairs?: No Does the patient have difficulty dressing or bathing?: No Does the patient have difficulty doing errands alone such as visiting a doctor's office or shopping?: No  MMSE - [place] Exam 08/03/2019  Orientation to time 5  Orientation to Place 5  Registration 3  Attention/ Calculation 5  Recall 3  Language- name 2 objects 2  Language- repeat 1  Language- follow 3 step command 3  Language- read & follow direction 1  Write a sentence 1  Copy design 1  Total score 30    Fall Risk  11/05/2019 04/30/2019 04/17/2019 01/27/2019 11/27/2018  Falls in the past year? 1 0 0 1 0  Number falls in past yr: 1 0 0 0 0  Injury with Fall? 0 - 0 0 0    LABS: Recent Results (from the past 2160 hour(s))  UA/M w/rflx Culture, Routine     Status: None   Collection Time: 11/05/19  2:11 PM   Specimen: Urine   URINE  Result Value Ref Range   Specific Gravity, UA 1.024 1.005 - 1.030   pH, UA 5.0 5.0 - 7.5   Color, UA Yellow Yellow   Appearance Ur Clear Clear   Leukocytes,UA Negative Negative   Protein,UA Negative Negative/Trace   Glucose, UA Negative Negative   Ketones, UA Negative Negative   RBC, UA Negative Negative   Bilirubin, UA Negative Negative   Urobilinogen, Ur 0.2 0.2 - 1.0 mg/dL   Nitrite, UA Negative Negative   Microscopic Examination Comment     Comment: Microscopic follows if indicated.   Microscopic Examination See below:     Comment: Microscopic was indicated and was performed.   Urinalysis Reflex Comment     Comment: This specimen will not reflex to a Urine Culture.  Microscopic Examination     Status: None   Collection Time: 11/05/19  2:11 PM   URINE  Result Value Ref Range   WBC, UA 0-5 0 - 5 /hpf   RBC 0-2 0 - 2 /hpf   Epithelial Cells (non renal) 0-10 0 - 10 /hpf   Casts None seen None seen /lpf   Mucus, UA Present Not Estab.   Bacteria, UA None seen None seen/Few  POCT Urine Drug Screen     Status: Abnormal   Collection Time: 11/05/19  2:16 PM  Result Value Ref Range   POC METHAMPHETAMINE UR None Detected None Detected   POC Opiate Ur None Detected None Detected   POC Barbiturate UR None Detected None Detected   POC Amphetamine UR  None Detected None Detected   POC Oxycodone UR None Detected None Detected   POC Cocaine UR None Detected None Detected   POC Ecstasy UR None Detected None Detected   POC TRICYCLICS UR None Detected None Detected   POC PHENCYCLIDINE UR None Detected None Detected   POC MARIJUANA UR None Detected None Detected   POC METHADONE UR None Detected None Detected   POC BENZODIAZEPINES UR Positive (A) None Detected   URINE TEMPERATURE     POC DRUG SCREEN OXIDANTS URINE     POC SPECIFIC GRAVITY URINE     POC PH URINE     Methylenedioxyamphetamine     Assessment/Plan: 1. Encounter for general adult medical examination with abnormal findings Annual health maintenance exam with pap smear today.   2. Mild intermittent asthma without complication May use rescue inhaler as needed for wheezing and shortness of breath.  - albuterol (VENTOLIN HFA) 108 (90 Base) MCG/ACT inhaler; Inhale 2 puffs into the lungs every 6 (six) hours as needed for wheezing or shortness of breath.  Dispense: 18 g; Refill: 3  3. Generalized anxiety disorder May take alprazolam 1mg  at bedtime as needed for acute anxiety. New prescription sent to her pharmacy.  - ALPRAZolam Duanne Moron) 1 MG tablet; Take 1 tablet (1 mg total) by mouth at bedtime as needed for anxiety.  Dispense: 90 tablet; Refill: 0  4. Primary insomnia ambien 10mg  may be taken at bedtime as needed for insomnia.  - zolpidem (AMBIEN) 10 MG tablet; Take 1 tablet (10 mg total) by mouth at bedtime as needed for sleep.  Dispense: 90 tablet; Refill: 1  5. Routine cervical smear - Pap IG and HPV (high risk) DNA detection  6. Encounter for long-term (current) use of medications - POCT Urine Drug Screen appropriately positive for B[facility] only.   7. Dysuria - UA/M w/rflx Culture, Routine  General Counseling: Lahana verbalizes understanding of the findings of todays visit and agrees with plan of treatment. I have discussed any further diagnostic evaluation that may be needed or  ordered today. We also reviewed her medications today. she has been encouraged to call the office with any questions or concerns that should arise related to todays visit.    Counseling:  This patient was seen by Leretha Pol FNP Collaboration with Dr Lavera Guise as a part of collaborative care agreement  Orders Placed This Encounter  Procedures  . Microscopic Examination  . UA/M w/rflx Culture, Routine  . POCT Urine Drug Screen    Meds ordered this encounter  Medications  . ALPRAZolam (XANAX) 1 MG tablet    Sig: Take 1 tablet (1 mg total) by mouth at bedtime as needed for anxiety.    Dispense:  90 tablet    Refill:  0    This is 90 day prescription    Order Specific Question:   Supervising Provider    Answer:   Lavera Guise [id number]  . zolpidem (AMBIEN) 10 MG tablet    Sig: Take 1 tablet (10 mg total) by mouth at bedtime as needed for sleep.    Dispense:  90 tablet    Refill:  1    This is 90 day prescription    Order Specific Question:   Supervising Provider    Answer:  KHAN, FOZIA M [id number]  . albuterol (VENTOLIN HFA) 108 (90 Base) MCG/ACT inhaler    Sig: Inhale 2 puffs into the lungs every 6 (six) hours as needed for wheezing or shortness of breath.    Dispense:  18 g    Refill:  3    Please fill as name brand ventolin as other albuterol prescriptions have not been effective.    Order Specific Question:   Supervising Provider    Answer:   Lavera Guise [id number]    Time spent: [place], MD  Internal Medicine

## 2019-11-05 NOTE — Progress Notes (Deleted)
[facility] [place], [place] [address]  Internal MEDICINE  Office Visit Note  Patient Name: Janet Austin  [id number]  [id number]  Date of Service: 11/05/2019  No chief complaint on file.   HPI     Current Medication: Outpatient Encounter Medications as of 11/05/2019  Medication Sig  . albuterol (PROVENTIL HFA;VENTOLIN HFA) 108 (90 Base) MCG/ACT inhaler Inhale into the lungs every 6 (six) hours as needed for wheezing or shortness of breath.  . ALPRAZolam (XANAX) 1 MG tablet Take 1 tablet (1 mg total) by mouth at bedtime as needed for anxiety.  Marland Kitchen atorvastatin (LIPITOR) 10 MG tablet Take 1 tablet (10 mg total) by mouth daily.  Marland Kitchen azithromycin (ZITHROMAX) 250 MG tablet Take 2 tablets po day one then take 1 tablet dally from days 2 through 10 for upper respiratory infection.  . clobetasol ointment (TEMOVATE) [id number] % Apply 1 application topically daily as needed.  . cyclobenzaprine (FLEXERIL) 5 MG tablet Take 5 mg by mouth 3 (three) times daily as needed for muscle spasms.  . fluconazole (DIFLUCAN) 150 MG tablet Take 1 tablet po once. May repeat dose in 3 days as needed for persistent symptoms.  . OxyCODONE HCl, Abuse Deter, (OXAYDO) 5 MG TABA Take by mouth.  . predniSONE (STERAPRED UNI-PAK 21 TAB) 10 MG (21) TBPK tablet 6 day taper - take by mouth as directed for 6 days  . triamcinolone ointment (KENALOG) 0.5 % Apply 1 application topically 2 (two) times daily.  . valACYclovir (VALTREX) 1000 MG tablet Take 1 tablet (1,000 mg total) by mouth 2 (two) times daily.  Marland Kitchen zolpidem (AMBIEN) 10 MG tablet Take 1 tablet (10 mg total) by mouth at bedtime as needed for sleep.   No facility-administered encounter medications on file as of 11/05/2019.     Surgical History: Past Surgical History:  Procedure Laterality Date  . ABDOMINAL HYSTERECTOMY    . BREAST SURGERY     double mastectomy  . BREAST SURGERY     reconstruction  . COLONOSCOPY WITH PROPOFOL N/A 05/29/2016    Procedure: COLONOSCOPY WITH PROPOFOL;  Surgeon: Lollie Sails, MD;  Location: [facility] ENDOSCOPY;  Service: Endoscopy;  Laterality: N/A;  . KNEE ARTHROSCOPY Right   . MASTECTOMY      Medical History: Past Medical History:  Diagnosis Date  . Anxiety   . Arthritis   . Cancer ([place])    left breast  . Headache    migraines  . PONV (postoperative nausea and vomiting)     Family History: Family History  Problem Relation Age of Onset  . Hypertension Father     Social History   Socioeconomic History  . Marital status: Married    Spouse name: Not on file  . Number of children: Not on file  . Years of education: Not on file  . Highest education level: Not on file  Occupational History  . Not on file  Social Needs  . Financial resource strain: Not on file  . Food insecurity    Worry: Not on file    Inability: Not on file  . Transportation needs    Medical: Not on file    Non-medical: Not on file  Tobacco Use  . Smoking status: Never Smoker  . Smokeless tobacco: Never Used  Substance and Sexual Activity  . Alcohol use: No  . Drug use: No  . Sexual activity: Not on file  Lifestyle  . Physical activity    Days per week: Not on  file    Minutes per session: Not on file  . Stress: Not on file  Relationships  . Social Herbalist on phone: Not on file    Gets together: Not on file    Attends religious service: Not on file    Active member of club or organization: Not on file    Attends meetings of clubs or organizations: Not on file    Relationship status: Not on file  . Intimate partner violence    Fear of current or ex partner: Not on file    Emotionally abused: Not on file    Physically abused: Not on file    Forced sexual activity: Not on file  Other Topics Concern  . Not on file  Social History Narrative  . Not on file      Review of Systems  Vital Signs: There were no vitals taken for this visit.   Physical  Exam     Assessment/Plan:   General Counseling: Janet Austin verbalizes understanding of the findings of todays visit and agrees with plan of treatment. I have discussed any further diagnostic evaluation that may be needed or ordered today. We also reviewed her medications today. she has been encouraged to call the office with any questions or concerns that should arise related to todays visit.    No orders of the defined types were placed in this encounter.   No orders of the defined types were placed in this encounter.   Time spent:*** Minutes      Dr Lavera Guise Internal medicine

## 2019-11-06 LAB — MICROSCOPIC EXAMINATION
Bacteria, UA: NONE SEEN
Casts: NONE SEEN /lpf

## 2019-11-06 LAB — UA/M W/RFLX CULTURE, ROUTINE
Bilirubin, UA: NEGATIVE
Glucose, UA: NEGATIVE
Ketones, UA: NEGATIVE
Leukocytes,UA: NEGATIVE
Nitrite, UA: NEGATIVE
Protein,UA: NEGATIVE
RBC, UA: NEGATIVE
Specific Gravity, UA: 1.024 (ref 1.005–1.030)
Urobilinogen, Ur: 0.2 mg/dL (ref 0.2–1.0)
pH, UA: 5 (ref 5.0–7.5)

## 2019-11-15 DIAGNOSIS — Z79899 Other long term (current) drug therapy: Secondary | ICD-10-CM | POA: Insufficient documentation

## 2019-11-15 DIAGNOSIS — Z124 Encounter for screening for malignant neoplasm of cervix: Secondary | ICD-10-CM | POA: Insufficient documentation

## 2019-11-17 LAB — PAP IG AND HPV HIGH-RISK: HPV, high-risk: NEGATIVE

## 2019-11-18 ENCOUNTER — Encounter: Payer: Self-pay | Admitting: Nurse Practitioner

## 2019-11-19 ENCOUNTER — Encounter: Payer: Self-pay | Admitting: Nurse Practitioner

## 2019-11-19 NOTE — Progress Notes (Signed)
Please let the patient know that her pap smear was normal. Thanks.

## 2019-11-23 ENCOUNTER — Telehealth: Payer: Self-pay

## 2019-11-23 NOTE — Telephone Encounter (Signed)
Pt was notified.  

## 2019-11-23 NOTE — Telephone Encounter (Signed)
-----   Message from Ronnell Freshwater, NP sent at 11/19/2019  9:10 AM EST ----- Please let the patient know that her pap smear was normal. Thanks.

## 2019-11-24 DIAGNOSIS — G894 Chronic pain syndrome: Secondary | ICD-10-CM | POA: Diagnosis not present

## 2019-11-24 DIAGNOSIS — Z79891 Long term (current) use of opiate analgesic: Secondary | ICD-10-CM | POA: Diagnosis not present

## 2019-11-24 DIAGNOSIS — G8928 Other chronic postprocedural pain: Secondary | ICD-10-CM | POA: Diagnosis not present

## 2019-11-24 DIAGNOSIS — M792 Neuralgia and neuritis, unspecified: Secondary | ICD-10-CM | POA: Diagnosis not present

## 2019-12-02 DIAGNOSIS — Z9011 Acquired absence of right breast and nipple: Secondary | ICD-10-CM | POA: Diagnosis not present

## 2019-12-02 DIAGNOSIS — C50912 Malignant neoplasm of unspecified site of left female breast: Secondary | ICD-10-CM | POA: Diagnosis not present

## 2019-12-09 ENCOUNTER — Encounter: Payer: Self-pay | Admitting: Nurse Practitioner

## 2019-12-09 ENCOUNTER — Ambulatory Visit (INDEPENDENT_AMBULATORY_CARE_PROVIDER_SITE_OTHER): Payer: Medicare Other | Admitting: Nurse Practitioner

## 2019-12-09 ENCOUNTER — Other Ambulatory Visit: Payer: Self-pay

## 2019-12-09 VITALS — HR 90 | Wt 225.0 lb

## 2019-12-09 DIAGNOSIS — B373 Candidiasis of vulva and vagina: Secondary | ICD-10-CM | POA: Diagnosis not present

## 2019-12-09 DIAGNOSIS — J069 Acute upper respiratory infection, unspecified: Secondary | ICD-10-CM

## 2019-12-09 DIAGNOSIS — R05 Cough: Secondary | ICD-10-CM

## 2019-12-09 DIAGNOSIS — B3731 Acute candidiasis of vulva and vagina: Secondary | ICD-10-CM

## 2019-12-09 DIAGNOSIS — J4520 Mild intermittent asthma, uncomplicated: Secondary | ICD-10-CM

## 2019-12-09 DIAGNOSIS — R059 Cough, unspecified: Secondary | ICD-10-CM

## 2019-12-09 MED ORDER — PREDNISONE 10 MG (21) PO TBPK: ORAL_TABLET | ORAL | 0 refills | Status: DC

## 2019-12-09 MED ORDER — AZITHROMYCIN 250 MG PO TABS: ORAL_TABLET | ORAL | 0 refills | Status: DC

## 2019-12-09 MED ORDER — FLUCONAZOLE 150 MG PO TABS: ORAL_TABLET | ORAL | 0 refills | Status: DC

## 2019-12-09 NOTE — Progress Notes (Signed)
[facility] [place],  [address]  Internal MEDICINE  Telephone Visit  Patient Name: Janet Austin  [id number]  [id number]  Date of Service: 12/09/2019  I connected with the patient at 12;42pm by webcam and verified the patients identity using two identifiers.   I discussed the limitations, risks, security and privacy concerns of performing an evaluation and management service by webcam and the availability of in person appointments. I also discussed with the patient that there may be a patient responsible charge related to the service.  The patient expressed understanding and agrees to proceed.    Chief Complaint  Patient presents with  . Telephone Assessment  . Telephone Screen  . Cough    for 3 weeks, congestion, does not believe it is covid, right ear is starting to get infection    The patient has been contacted via webcam for follow up visit due to concerns for spread of novel coronavirus.  She presents for acute appointment. She has congestion, scratchy throat. She has pain in her ear and slight headache. She denies fever. She denies nausea or vomiting. She has cough which is dry and harsh and is productive. She states that symptoms have been present for several weeks. They have gradually been getting worse.       Current Medication: Outpatient Encounter Medications as of 12/09/2019  Medication Sig  . albuterol (VENTOLIN HFA) 108 (90 Base) MCG/ACT inhaler Inhale 2 puffs into the lungs every 6 (six) hours as needed for wheezing or shortness of breath.  . ALPRAZolam (XANAX) 1 MG tablet Take 1 tablet (1 mg total) by mouth at bedtime as needed for anxiety.  Marland Kitchen atorvastatin (LIPITOR) 10 MG tablet Take 1 tablet (10 mg total) by mouth daily.  . clobetasol ointment (TEMOVATE) [id number] % Apply 1 application topically daily as needed.  . cyclobenzaprine (FLEXERIL) 5 MG tablet Take 5 mg by mouth 3 (three) times daily as needed for muscle spasms.  . OxyCODONE  HCl, Abuse Deter, (OXAYDO) 5 MG TABA Take by mouth.  . triamcinolone ointment (KENALOG) 0.5 % Apply 1 application topically 2 (two) times daily.  . valACYclovir (VALTREX) 1000 MG tablet Take 1 tablet (1,000 mg total) by mouth 2 (two) times daily.  Marland Kitchen zolpidem (AMBIEN) 10 MG tablet Take 1 tablet (10 mg total) by mouth at bedtime as needed for sleep.  Marland Kitchen azithromycin (ZITHROMAX) 250 MG tablet z-pack - take as directed for 5 days for upper respiratory infection.  . fluconazole (DIFLUCAN) 150 MG tablet Take 1 tablet po once. May repeat dose in 3 days as needed for persistent symptoms.  . predniSONE (STERAPRED UNI-PAK 21 TAB) 10 MG (21) TBPK tablet 6 day taper - take by mouth as directed for 6 days   No facility-administered encounter medications on file as of 12/09/2019.    Surgical History: Past Surgical History:  Procedure Laterality Date  . ABDOMINAL HYSTERECTOMY    . BREAST SURGERY     double mastectomy  . BREAST SURGERY     reconstruction  . COLONOSCOPY WITH PROPOFOL N/A 05/29/2016   Procedure: COLONOSCOPY WITH PROPOFOL;  Surgeon: Lollie Sails, MD;  Location: [facility] ENDOSCOPY;  Service: Endoscopy;  Laterality: N/A;  . KNEE ARTHROSCOPY Right   . MASTECTOMY      Medical History: Past Medical History:  Diagnosis Date  . Anxiety   . Arthritis   . Cancer ([place])    left breast  . Headache    migraines  . PONV (postoperative nausea  and vomiting)     Family History: Family History  Problem Relation Age of Onset  . Hypertension Father     Social History   Socioeconomic History  . Marital status: Married    Spouse name: Not on file  . Number of children: Not on file  . Years of education: Not on file  . Highest education level: Not on file  Occupational History  . Not on file  Tobacco Use  . Smoking status: Never Smoker  . Smokeless tobacco: Never Used  Substance and Sexual Activity  . Alcohol use: No  . Drug use: No  . Sexual activity: Not on file  Other Topics  Concern  . Not on file  Social History Narrative  . Not on file   Social Determinants of Health   Financial Resource Strain:   . Difficulty of Paying Living Expenses: Not on file  Food Insecurity:   . Worried About Charity fundraiser in the Last Year: Not on file  . Ran Out of Food in the Last Year: Not on file  Transportation Needs:   . Lack of Transportation (Medical): Not on file  . Lack of Transportation (Non-Medical): Not on file  Physical Activity:   . Days of Exercise per Week: Not on file  . Minutes of Exercise per Session: Not on file  Stress:   . Feeling of Stress : Not on file  Social Connections:   . Frequency of Communication with Friends and Family: Not on file  . Frequency of Social Gatherings with Friends and Family: Not on file  . Attends Religious Services: Not on file  . Active Member of Clubs or Organizations: Not on file  . Attends Archivist Meetings: Not on file  . Marital Status: Not on file  Intimate Partner Violence:   . Fear of Current or Ex-Partner: Not on file  . Emotionally Abused: Not on file  . Physically Abused: Not on file  . Sexually Abused: Not on file      Review of Systems  Constitutional: Positive for chills and fatigue. Negative for fever.  HENT: Positive for congestion, ear pain, postnasal drip, rhinorrhea, sinus pressure, sinus pain, sore throat and voice change.   Respiratory: Positive for cough and wheezing.   Cardiovascular: Negative for chest pain and palpitations.  Gastrointestinal: Negative for nausea.  Genitourinary:       Patient states that she easily gets yeast infection when she takes antibiotics   Musculoskeletal: Positive for arthralgias and myalgias.  Allergic/Immunologic: Negative.   Neurological: Positive for headaches.  Hematological: Positive for adenopathy.    Today's Vitals   12/09/19 1154  Pulse: 90  SpO2: 99%  Weight: 225 lb (102.1 kg)   Body mass index is 38.62  kg/m.  Observation/Objective:   The patient is alert and oriented. She is pleasant and answers all questions appropriately. Breathing is non-labored. She is in no acute distress at this time. The patient is hoarse and has dry and harsh cough.    Assessment/Plan:  1. Acute upper respiratory infection Start z-pack. Take as directed for 5 days. Rest and increase fluids. Continue to take OTC medication as needed and as indicated to treat acute symptoms.  - azithromycin (ZITHROMAX) 250 MG tablet; z-pack - take as directed for 5 days for upper respiratory infection.  Dispense: 6 tablet; Refill: 0  2. Cough Add prednisone taper. Take as directed for 6 days.  - predniSONE (STERAPRED UNI-PAK 21 TAB) 10 MG (21) TBPK tablet;  6 day taper - take by mouth as directed for 6 days  Dispense: 21 tablet; Refill: 0  3. Mild intermittent asthma without complication Use rescue inhaler as needed and as prescribed for cough/wheezing.   4. Vaginal candida Take diflucan as needed for yeast infection. - fluconazole (DIFLUCAN) 150 MG tablet; Take 1 tablet po once. May repeat dose in 3 days as needed for persistent symptoms.  Dispense: 3 tablet; Refill: 0   General Counseling: Raimi verbalizes understanding of the findings of today's phone visit and agrees with plan of treatment. I have discussed any further diagnostic evaluation that may be needed or ordered today. We also reviewed her medications today. she has been encouraged to call the office with any questions or concerns that should arise related to todays visit.   This patient was seen by [place] with Dr Lavera Guise as a part of collaborative care agreement  Meds ordered this encounter  Medications  . azithromycin (ZITHROMAX) 250 MG tablet    Sig: z-pack - take as directed for 5 days for upper respiratory infection.    Dispense:  6 tablet    Refill:  0    Order Specific Question:   Supervising Provider    Answer:   Lavera Guise [id number]  . predniSONE (STERAPRED UNI-PAK 21 TAB) 10 MG (21) TBPK tablet    Sig: 6 day taper - take by mouth as directed for 6 days    Dispense:  21 tablet    Refill:  0    Order Specific Question:   Supervising Provider    Answer:   Lavera Guise [place]  . fluconazole (DIFLUCAN) 150 MG tablet    Sig: Take 1 tablet po once. May repeat dose in 3 days as needed for persistent symptoms.    Dispense:  3 tablet    Refill:  0    Order Specific Question:   Supervising Provider    Answer:   Lavera Guise [id number]    Time spent: 107 Minutes    Dr Lavera Guise Internal medicine

## 2019-12-13 IMAGING — CR DG CHEST 2V
1 series · 2 of 2 positions shown · non-contrast
Comparison: 11/13/2016

CLINICAL DATA: Cough for 2 months. Upper respiratory tract
infection. Personal history of left breast carcinoma.

EXAM:
CHEST - 2 VIEW

[Series 1: dg chest 2 view · 0.14mm/px · 2 of 2 slices shown]
[im 1/2]
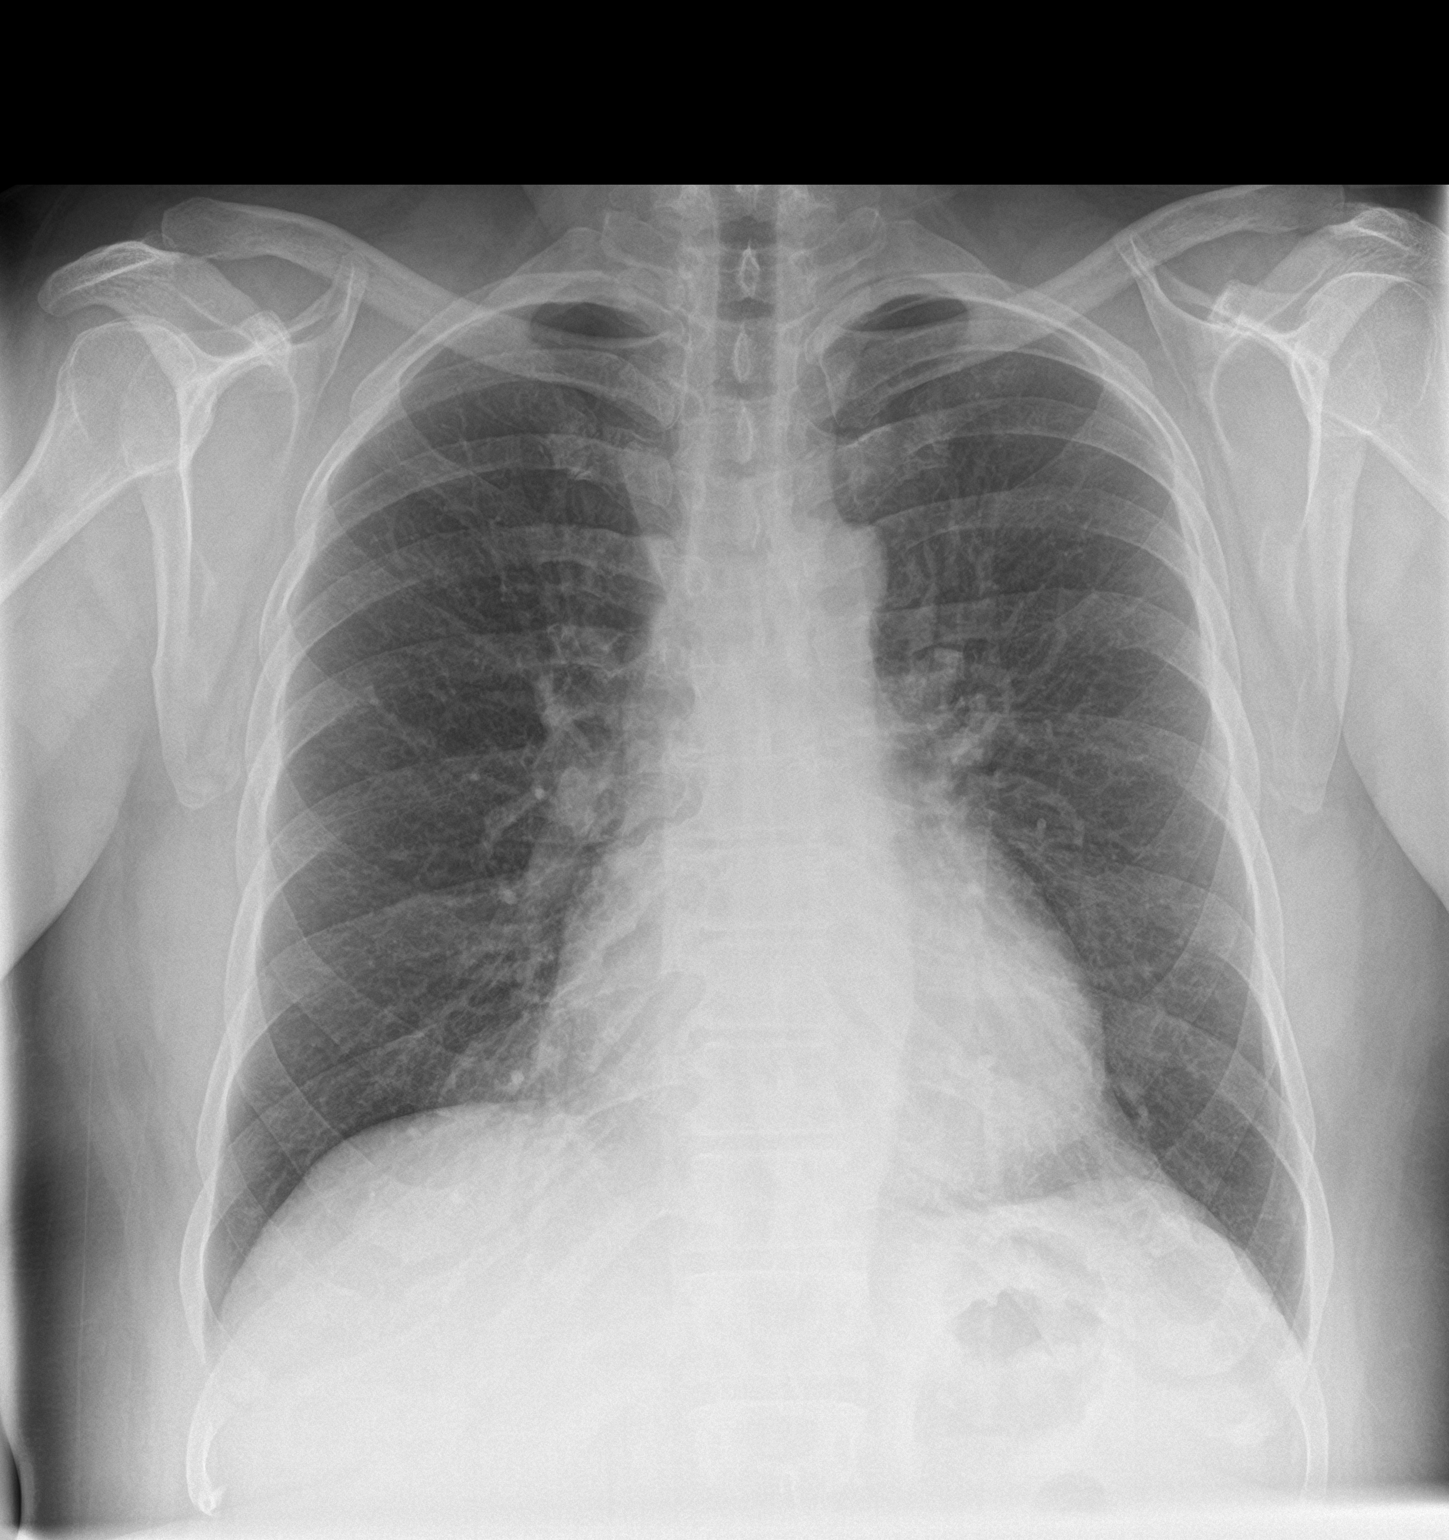
[im 2/2]
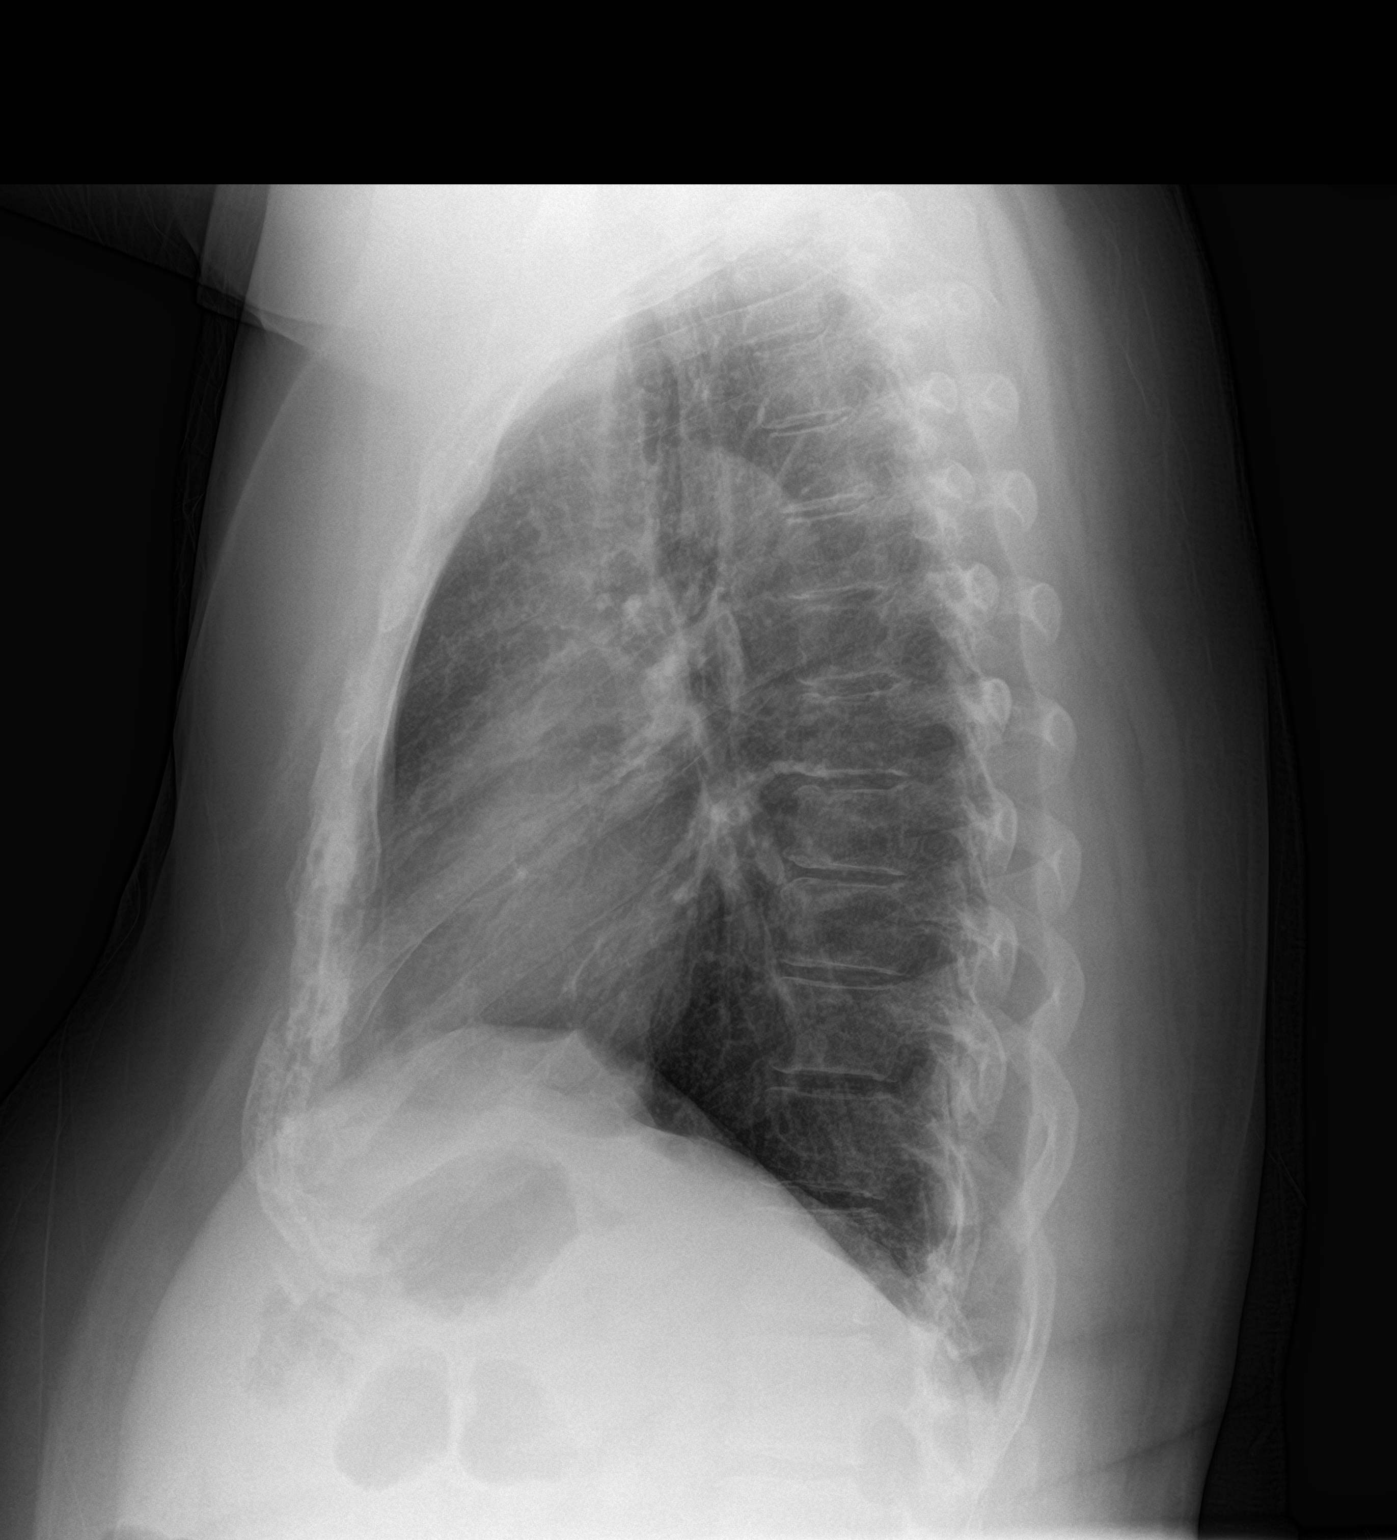

[2 of 2 positions shown; findings below may reference images not displayed]

FINDINGS: The heart size and mediastinal contours are within normal limits.
Both lungs are clear. The visualized skeletal structures are
unremarkable.
IMPRESSION: No active cardiopulmonary disease.

## 2019-12-16 ENCOUNTER — Other Ambulatory Visit: Payer: Self-pay | Admitting: Nurse Practitioner

## 2019-12-16 ENCOUNTER — Telehealth: Payer: Self-pay

## 2019-12-16 DIAGNOSIS — J069 Acute upper respiratory infection, unspecified: Secondary | ICD-10-CM

## 2019-12-16 MED ORDER — LEVOFLOXACIN 500 MG PO TABS: 500.0000 mg | ORAL_TABLET | Freq: Every day | ORAL | 0 refills | Status: DC

## 2019-12-16 NOTE — Telephone Encounter (Signed)
Pt advised we send levaquin to [facility]

## 2019-12-16 NOTE — Telephone Encounter (Signed)
Will do round of levaquin 500mg  daily after z-pack and prednisone dose pack left patient with persistent symptoms. Sent to [facility].

## 2019-12-16 NOTE — Progress Notes (Signed)
Will do round of levaquin 500mg  daily after z-pack and prednisone dose pack left patient with persistent symptoms. Sent to [facility].

## 2020-01-21 DIAGNOSIS — M792 Neuralgia and neuritis, unspecified: Secondary | ICD-10-CM | POA: Diagnosis not present

## 2020-01-21 DIAGNOSIS — Z79891 Long term (current) use of opiate analgesic: Secondary | ICD-10-CM | POA: Diagnosis not present

## 2020-01-21 DIAGNOSIS — G894 Chronic pain syndrome: Secondary | ICD-10-CM | POA: Diagnosis not present

## 2020-01-21 DIAGNOSIS — G8928 Other chronic postprocedural pain: Secondary | ICD-10-CM | POA: Diagnosis not present

## 2020-02-10 ENCOUNTER — Telehealth: Payer: Self-pay

## 2020-02-10 NOTE — Telephone Encounter (Signed)
Confirmed and screened for 02-12-20 ov.

## 2020-02-12 ENCOUNTER — Encounter: Payer: Self-pay | Admitting: Nurse Practitioner

## 2020-02-12 ENCOUNTER — Other Ambulatory Visit: Payer: Self-pay

## 2020-02-12 ENCOUNTER — Ambulatory Visit: Payer: Medicare Other | Admitting: Nurse Practitioner

## 2020-02-12 VITALS — BP 126/74 | HR 95 | Temp 97.4°F | Resp 16 | Ht 64.0 in | Wt 234.0 lb

## 2020-02-12 DIAGNOSIS — F411 Generalized anxiety disorder: Secondary | ICD-10-CM

## 2020-02-12 DIAGNOSIS — B001 Herpesviral vesicular dermatitis: Secondary | ICD-10-CM | POA: Diagnosis not present

## 2020-02-12 DIAGNOSIS — R5383 Other fatigue: Secondary | ICD-10-CM

## 2020-02-12 DIAGNOSIS — F5101 Primary insomnia: Secondary | ICD-10-CM

## 2020-02-12 DIAGNOSIS — E782 Mixed hyperlipidemia: Secondary | ICD-10-CM

## 2020-02-12 DIAGNOSIS — J4520 Mild intermittent asthma, uncomplicated: Secondary | ICD-10-CM

## 2020-02-12 MED ORDER — ZOLPIDEM TARTRATE 10 MG PO TABS: 10.0000 mg | ORAL_TABLET | Freq: Every evening | ORAL | 1 refills | Status: DC | PRN

## 2020-02-12 MED ORDER — VALACYCLOVIR HCL 1 G PO TABS: 1000.0000 mg | ORAL_TABLET | Freq: Two times a day (BID) | ORAL | 1 refills | Status: DC

## 2020-02-12 MED ORDER — ATORVASTATIN CALCIUM 10 MG PO TABS: 10.0000 mg | ORAL_TABLET | Freq: Every day | ORAL | 2 refills | Status: DC

## 2020-02-12 MED ORDER — ALPRAZOLAM 1 MG PO TABS: 1.0000 mg | ORAL_TABLET | Freq: Every evening | ORAL | 1 refills | Status: DC | PRN

## 2020-02-12 MED ORDER — ALBUTEROL SULFATE HFA 108 (90 BASE) MCG/ACT IN AERS: 2.0000 | INHALATION_SPRAY | Freq: Four times a day (QID) | RESPIRATORY_TRACT | 3 refills | Status: DC | PRN

## 2020-02-12 NOTE — Progress Notes (Signed)
[facility] [place], [place] [address]  Internal MEDICINE  Office Visit Note  Patient Name: Janet Austin  [id number]  [id number]  Date of Service: 02/12/2020  Chief Complaint  Patient presents with  . Follow-up    still feel lungs are full, real tired  . Anxiety  . Arthritis    The patient is here for routine follow up visit. She states that symptoms of upper respiratory infection have resolved. She does still hear herself wheeze. This is mostly at night when she is laying down. Rarely uses rescue inhaler. She also continues to feel fullness in the right ear.  States that she feels very fatigued. Sometimes, the fatigue can be so bad, she doesn't want to get up and get moving. States that she always does, though. Has been anemic in the past. Has not had lab work checked since 10/2018.  Needs to have refilsl on several routine medications. She asked that we fill all prescriptions as 90 day prescriptions. Medications are less expensive this way. Her husband is about to start a new, oral chemotherapy medication which costs $4100 per month, out of pocket.       Current Medication: Outpatient Encounter Medications as of 02/12/2020  Medication Sig  . albuterol (VENTOLIN HFA) 108 (90 Base) MCG/ACT inhaler Inhale 2 puffs into the lungs every 6 (six) hours as needed for wheezing or shortness of breath.  . ALPRAZolam (XANAX) 1 MG tablet Take 1 tablet (1 mg total) by mouth at bedtime as needed for anxiety.  Marland Kitchen atorvastatin (LIPITOR) 10 MG tablet Take 1 tablet (10 mg total) by mouth daily.  . clobetasol ointment (TEMOVATE) [id number] % Apply 1 application topically daily as needed.  . cyclobenzaprine (FLEXERIL) 5 MG tablet Take 5 mg by mouth 3 (three) times daily as needed for muscle spasms.  . fluconazole (DIFLUCAN) 150 MG tablet Take 1 tablet po once. May repeat dose in 3 days as needed for persistent symptoms.  Marland Kitchen levofloxacin (LEVAQUIN) 500 MG tablet Take 1 tablet (500 mg  total) by mouth daily.  . OxyCODONE HCl, Abuse Deter, (OXAYDO) 5 MG TABA Take by mouth.  . predniSONE (STERAPRED UNI-PAK 21 TAB) 10 MG (21) TBPK tablet 6 day taper - take by mouth as directed for 6 days  . triamcinolone ointment (KENALOG) 0.5 % Apply 1 application topically 2 (two) times daily.  . valACYclovir (VALTREX) 1000 MG tablet Take 1 tablet (1,000 mg total) by mouth 2 (two) times daily.  Marland Kitchen zolpidem (AMBIEN) 10 MG tablet Take 1 tablet (10 mg total) by mouth at bedtime as needed for sleep.  . [DISCONTINUED] albuterol (VENTOLIN HFA) 108 (90 Base) MCG/ACT inhaler Inhale 2 puffs into the lungs every 6 (six) hours as needed for wheezing or shortness of breath.  . [DISCONTINUED] ALPRAZolam (XANAX) 1 MG tablet Take 1 tablet (1 mg total) by mouth at bedtime as needed for anxiety.  . [DISCONTINUED] atorvastatin (LIPITOR) 10 MG tablet Take 1 tablet (10 mg total) by mouth daily.  . [DISCONTINUED] valACYclovir (VALTREX) 1000 MG tablet Take 1 tablet (1,000 mg total) by mouth 2 (two) times daily.  . [DISCONTINUED] zolpidem (AMBIEN) 10 MG tablet Take 1 tablet (10 mg total) by mouth at bedtime as needed for sleep.   No facility-administered encounter medications on file as of 02/12/2020.    Surgical History: Past Surgical History:  Procedure Laterality Date  . ABDOMINAL HYSTERECTOMY    . BREAST SURGERY     double mastectomy  . BREAST SURGERY  reconstruction  . COLONOSCOPY WITH PROPOFOL N/A 05/29/2016   Procedure: COLONOSCOPY WITH PROPOFOL;  Surgeon: Lollie Sails, MD;  Location: [facility] ENDOSCOPY;  Service: Endoscopy;  Laterality: N/A;  . KNEE ARTHROSCOPY Right   . MASTECTOMY      Medical History: Past Medical History:  Diagnosis Date  . Anxiety   . Arthritis   . Cancer ([place])    left breast  . Headache    migraines  . PONV (postoperative nausea and vomiting)     Family History: Family History  Problem Relation Age of Onset  . Hypertension Father     Social History    Socioeconomic History  . Marital status: Married    Spouse name: Not on file  . Number of children: Not on file  . Years of education: Not on file  . Highest education level: Not on file  Occupational History  . Not on file  Tobacco Use  . Smoking status: Never Smoker  . Smokeless tobacco: Never Used  Substance and Sexual Activity  . Alcohol use: No  . Drug use: No  . Sexual activity: Not on file  Other Topics Concern  . Not on file  Social History Narrative  . Not on file   Social Determinants of Health   Financial Resource Strain:   . Difficulty of Paying Living Expenses: Not on file  Food Insecurity:   . Worried About Charity fundraiser in the Last Year: Not on file  . Ran Out of Food in the Last Year: Not on file  Transportation Needs:   . Lack of Transportation (Medical): Not on file  . Lack of Transportation (Non-Medical): Not on file  Physical Activity:   . Days of Exercise per Week: Not on file  . Minutes of Exercise per Session: Not on file  Stress:   . Feeling of Stress : Not on file  Social Connections:   . Frequency of Communication with Friends and Family: Not on file  . Frequency of Social Gatherings with Friends and Family: Not on file  . Attends Religious Services: Not on file  . Active Member of Clubs or Organizations: Not on file  . Attends Archivist Meetings: Not on file  . Marital Status: Not on file  Intimate Partner Violence:   . Fear of Current or Ex-Partner: Not on file  . Emotionally Abused: Not on file  . Physically Abused: Not on file  . Sexually Abused: Not on file      Review of Systems  Constitutional: Positive for fatigue. Negative for chills and unexpected weight change.  HENT: Positive for ear pain. Negative for congestion, postnasal drip, rhinorrhea, sneezing and sore throat.   Respiratory: Positive for wheezing. Negative for cough, chest tightness and shortness of breath.        Wheezing occurs when she lays  down at night.   Cardiovascular: Negative for chest pain and palpitations.  Gastrointestinal: Negative for abdominal pain, constipation, diarrhea, nausea and vomiting.  Endocrine: Negative for cold intolerance, heat intolerance, polydipsia and polyuria.  Musculoskeletal: Negative for arthralgias, back pain, joint swelling and neck pain.  Skin: Negative for rash.  Allergic/Immunologic: Positive for environmental allergies.  Neurological: Negative for dizziness, tremors, numbness and headaches.  Hematological: Negative for adenopathy. Does not bruise/bleed easily.  Psychiatric/Behavioral: Positive for sleep disturbance. Negative for behavioral problems (Depression) and suicidal ideas. The patient is nervous/anxious.    Today's Vitals   02/12/20 1333  BP: 126/74  Pulse: 95  Resp: 16  Temp: (!) 97.4 F (36.3 C)  SpO2: 96%  Weight: 234 lb (106.1 kg)  Height: 5\' 4"  (1.626 m)   Body mass index is 40.17 kg/m.  Physical Exam Vitals and nursing note reviewed.  Constitutional:      General: She is not in acute distress.    Appearance: Normal appearance. She is well-developed. She is not diaphoretic.  HENT:     Head: Normocephalic and atraumatic.     Right Ear: Tympanic membrane is bulging.     Left Ear: Tympanic membrane is bulging.     Mouth/Throat:     Pharynx: No oropharyngeal exudate.  Eyes:     Pupils: Pupils are equal, round, and reactive to light.  Neck:     Thyroid: No thyromegaly.     Vascular: No carotid bruit or JVD.     Trachea: No tracheal deviation.  Cardiovascular:     Rate and Rhythm: Normal rate and regular rhythm.     Heart sounds: Normal heart sounds. No murmur. No friction rub. No gallop.   Pulmonary:     Effort: Pulmonary effort is normal. No respiratory distress.     Breath sounds: Normal breath sounds. No wheezing or rales.  Chest:     Chest wall: No tenderness.  Abdominal:     Palpations: Abdomen is soft.  Musculoskeletal:        General: Normal range  of motion.     Cervical back: Normal range of motion and neck supple.  Lymphadenopathy:     Cervical: No cervical adenopathy.  Skin:    General: Skin is warm and dry.  Neurological:     Mental Status: She is alert and oriented to person, place, and time.     Cranial Nerves: No cranial nerve deficit.  Psychiatric:        Attention and Perception: Attention and perception normal.        Mood and Affect: Affect normal. Mood is anxious.        Speech: Speech normal.        Behavior: Behavior normal. Behavior is cooperative.        Thought Content: Thought content normal.        Cognition and Memory: Cognition and memory normal.        Judgment: Judgment normal.    Assessment/Plan:  1. Fatigue, unspecified type Lab slip given to have routine, fasting labs done. Include anemia and thyroid panel. Also add HgbA1c  2. Mild intermittent asthma without complication Lungs clear during today's visit. Use rescue inhaler as needed and as prescribed. Consider using about 1 hour prior to laying down. Reassess at next visit.  - albuterol (VENTOLIN HFA) 108 (90 Base) MCG/ACT inhaler; Inhale 2 puffs into the lungs every 6 (six) hours as needed for wheezing or shortness of breath.  Dispense: 54 g; Refill: 3  3. Mixed hyperlipidemia Check fasting lipid panel. Adjust atorvastatin dosing as indicated.  - atorvastatin (LIPITOR) 10 MG tablet; Take 1 tablet (10 mg total) by mouth daily.  Dispense: 90 tablet; Refill: 2  4. Fever blister Use valacyclovir s needed and as prescribed  - valACYclovir (VALTREX) 1000 MG tablet; Take 1 tablet (1,000 mg total) by mouth 2 (two) times daily.  Dispense: 180 tablet; Refill: 1  5. Primary insomnia May ake ambien 10mg  at bedtime as needed for insomnia.  - zolpidem (AMBIEN) 10 MG tablet; Take 1 tablet (10 mg total) by mouth at bedtime as needed for sleep.  Dispense: 90 tablet; Refill: 1  6. Generalized anxiety disorder Use alprazoalm 1mg  daily as needed for acute  anxiety.  - ALPRAZolam (XANAX) 1 MG tablet; Take 1 tablet (1 mg total) by mouth at bedtime as needed for anxiety.  Dispense: 90 tablet; Refill: 1  General Counseling: Shabreka verbalizes understanding of the findings of todays visit and agrees with plan of treatment. I have discussed any further diagnostic evaluation that may be needed or ordered today. We also reviewed her medications today. she has been encouraged to call the office with any questions or concerns that should arise related to todays visit.   This patient was seen by [place] with Dr Lavera Guise as a part of collaborative care agreement   Meds ordered this encounter  Medications  . albuterol (VENTOLIN HFA) 108 (90 Base) MCG/ACT inhaler    Sig: Inhale 2 puffs into the lungs every 6 (six) hours as needed for wheezing or shortness of breath.    Dispense:  54 g    Refill:  3    Please fill as name brand ventolin as other albuterol prescriptions have not been effective.    Order Specific Question:   Supervising Provider    Answer:   Lavera Guise [id number]  . ALPRAZolam (XANAX) 1 MG tablet    Sig: Take 1 tablet (1 mg total) by mouth at bedtime as needed for anxiety.    Dispense:  90 tablet    Refill:  1    This is 90 day prescription    Order Specific Question:   Supervising Provider    Answer:   Lavera Guise [id number]  . atorvastatin (LIPITOR) 10 MG tablet    Sig: Take 1 tablet (10 mg total) by mouth daily.    Dispense:  90 tablet    Refill:  2    Order Specific Question:   Supervising Provider    Answer:   Lavera Guise [id number]  . valACYclovir (VALTREX) 1000 MG tablet    Sig: Take 1 tablet (1,000 mg total) by mouth 2 (two) times daily.    Dispense:  180 tablet    Refill:  1    Order Specific Question:   Supervising Provider    Answer:   Lavera Guise [id number]  . zolpidem (AMBIEN) 10 MG tablet    Sig: Take 1 tablet (10 mg total) by mouth at bedtime as needed for sleep.    Dispense:  90 tablet     Refill:  1    This is 90 day prescription    Order Specific Question:   Supervising Provider    Answer:   Lavera Guise [id number]    Total time spent: 30 Minutes   Time spent includes review of chart, medications, test results, and follow up plan with the patient.      Dr Lavera Guise Internal medicine

## 2020-03-04 ENCOUNTER — Ambulatory Visit: Payer: Medicare Other | Admitting: Nurse Practitioner

## 2020-03-04 ENCOUNTER — Encounter: Payer: Self-pay | Admitting: Nurse Practitioner

## 2020-03-04 VITALS — Temp 99.3°F | Ht 64.0 in | Wt 234.0 lb

## 2020-03-04 DIAGNOSIS — Z20822 Contact with and (suspected) exposure to covid-19: Secondary | ICD-10-CM | POA: Diagnosis not present

## 2020-03-04 DIAGNOSIS — J069 Acute upper respiratory infection, unspecified: Secondary | ICD-10-CM | POA: Diagnosis not present

## 2020-03-04 DIAGNOSIS — R05 Cough: Secondary | ICD-10-CM

## 2020-03-04 DIAGNOSIS — R059 Cough, unspecified: Secondary | ICD-10-CM

## 2020-03-04 MED ORDER — METHYLPREDNISOLONE 4 MG PO TBPK: ORAL_TABLET | ORAL | 0 refills | Status: DC

## 2020-03-04 MED ORDER — LEVOFLOXACIN 500 MG PO TABS: 500.0000 mg | ORAL_TABLET | Freq: Every day | ORAL | 0 refills | Status: DC

## 2020-03-04 MED ORDER — HYDROCOD POLST-CPM POLST ER 10-8 MG/5ML PO SUER: 5.0000 mL | Freq: Two times a day (BID) | ORAL | 0 refills | Status: DC | PRN

## 2020-03-04 NOTE — Progress Notes (Signed)
[facility] [place], [place] [address]  Internal MEDICINE  Telephone Visit  Patient Name: Janet Austin  [id number]  [id number]  Date of Service: 03/13/2020  I connected with the patient at 4:25pm by webcam and verified the patients identity using two identifiers.   I discussed the limitations, risks, security and privacy concerns of performing an evaluation and management service by webcam and the availability of in person appointments. I also discussed with the patient that there may be a patient responsible charge related to the service.  The patient expressed understanding and agrees to proceed.    Chief Complaint  Patient presents with  . Telephone Assessment    going on for 1 week  . Telephone Screen  . Cough  . Sinusitis  . Headache  . Sore Throat  . Fever    The patient has been contacted via webcam for follow up visit due to concerns for spread of novel coronavirus. The patient presents for acute visit. States she started coughing on 3/7. Had watery eyes and nasal congestion. On 3/14, she started getting very congested, has body aches, severe cough, headache, and low grade fever. She has been self isolating at home since symptoms started. She is scheduled to have test for COVID 19 tomorrow morning.       Current Medication: Outpatient Encounter Medications as of 03/04/2020  Medication Sig  . albuterol (VENTOLIN HFA) 108 (90 Base) MCG/ACT inhaler Inhale 2 puffs into the lungs every 6 (six) hours as needed for wheezing or shortness of breath.  . ALPRAZolam (XANAX) 1 MG tablet Take 1 tablet (1 mg total) by mouth at bedtime as needed for anxiety.  Marland Kitchen atorvastatin (LIPITOR) 10 MG tablet Take 1 tablet (10 mg total) by mouth daily.  . clobetasol ointment (TEMOVATE) [id number] % Apply 1 application topically daily as needed.  . cyclobenzaprine (FLEXERIL) 5 MG tablet Take 5 mg by mouth 3 (three) times daily as needed for muscle spasms.  . fluconazole  (DIFLUCAN) 150 MG tablet Take 1 tablet po once. May repeat dose in 3 days as needed for persistent symptoms.  Marland Kitchen levofloxacin (LEVAQUIN) 500 MG tablet Take 1 tablet (500 mg total) by mouth daily.  . OxyCODONE HCl, Abuse Deter, (OXAYDO) 5 MG TABA Take by mouth.  . predniSONE (STERAPRED UNI-PAK 21 TAB) 10 MG (21) TBPK tablet 6 day taper - take by mouth as directed for 6 days  . triamcinolone ointment (KENALOG) 0.5 % Apply 1 application topically 2 (two) times daily.  . valACYclovir (VALTREX) 1000 MG tablet Take 1 tablet (1,000 mg total) by mouth 2 (two) times daily.  Marland Kitchen zolpidem (AMBIEN) 10 MG tablet Take 1 tablet (10 mg total) by mouth at bedtime as needed for sleep.  . [DISCONTINUED] levofloxacin (LEVAQUIN) 500 MG tablet Take 1 tablet (500 mg total) by mouth daily.  . chlorpheniramine-HYDROcodone (TUSSIONEX PENNKINETIC ER) 10-8 MG/5ML SUER Take 5 mLs by mouth every 12 (twelve) hours as needed for cough.  . methylPREDNISolone (MEDROL) 4 MG TBPK tablet Take by mouth as directed for 6 days   No facility-administered encounter medications on file as of 03/04/2020.    Surgical History: Past Surgical History:  Procedure Laterality Date  . ABDOMINAL HYSTERECTOMY    . BREAST SURGERY     double mastectomy  . BREAST SURGERY     reconstruction  . COLONOSCOPY WITH PROPOFOL N/A 05/29/2016   Procedure: COLONOSCOPY WITH PROPOFOL;  Surgeon: Lollie Sails, MD;  Location: [facility] ENDOSCOPY;  Service: Endoscopy;  Laterality: N/A;  . KNEE ARTHROSCOPY Right   . MASTECTOMY      Medical History: Past Medical History:  Diagnosis Date  . Anxiety   . Arthritis   . Cancer ([place])    left breast  . Headache    migraines  . PONV (postoperative nausea and vomiting)     Family History: Family History  Problem Relation Age of Onset  . Hypertension Father     Social History   Socioeconomic History  . Marital status: Married    Spouse name: Not on file  . Number of children: Not on file  . Years of  education: Not on file  . Highest education level: Not on file  Occupational History  . Not on file  Tobacco Use  . Smoking status: Never Smoker  . Smokeless tobacco: Never Used  Substance and Sexual Activity  . Alcohol use: No  . Drug use: No  . Sexual activity: Not on file  Other Topics Concern  . Not on file  Social History Narrative  . Not on file   Social Determinants of Health   Financial Resource Strain:   . Difficulty of Paying Living Expenses:   Food Insecurity:   . Worried About Charity fundraiser in the Last Year:   . Arboriculturist in the Last Year:   Transportation Needs:   . Film/video editor (Medical):   Marland Kitchen Lack of Transportation (Non-Medical):   Physical Activity:   . Days of Exercise per Week:   . Minutes of Exercise per Session:   Stress:   . Feeling of Stress :   Social Connections:   . Frequency of Communication with Friends and Family:   . Frequency of Social Gatherings with Friends and Family:   . Attends Religious Services:   . Active Member of Clubs or Organizations:   . Attends Archivist Meetings:   Marland Kitchen Marital Status:   Intimate Partner Violence:   . Fear of Current or Ex-Partner:   . Emotionally Abused:   Marland Kitchen Physically Abused:   . Sexually Abused:       Review of Systems  Constitutional: Positive for chills, fatigue and fever.  HENT: Positive for congestion, ear pain, postnasal drip, rhinorrhea, sinus pressure, sinus pain, sore throat and voice change.   Respiratory: Positive for cough and wheezing.   Cardiovascular: Negative for chest pain and palpitations.  Gastrointestinal: Negative for nausea.  Genitourinary:       Patient states that she easily gets yeast infection when she takes antibiotics   Musculoskeletal: Positive for arthralgias and myalgias.  Allergic/Immunologic: Negative.   Neurological: Positive for headaches.  Hematological: Positive for adenopathy.    Today's Vitals   03/04/20 1440  Temp: 99.3 F  (37.4 C)  SpO2: 95%  Weight: 234 lb (106.1 kg)  Height: 5\' 4"  (1.626 m)   Body mass index is 40.17 kg/m.  Observation/Objective:   The patient is alert and oriented. She is pleasant and answers all questions appropriately. Breathing is non-labored. She is in no acute distress at this time. She is flushed, very congested, and looks as though she feels poorly.    Assessment/Plan: 1. Acute upper respiratory infection Start levofloxacin 500mg  daily for next 10 days. Add medrol dose pack. Take as directed for 6 days. Rest and increase fluids. Take OTC medications as needed and as indicated to improve acute symptoms.  - methylPREDNISolone (MEDROL) 4 MG TBPK tablet; Take by mouth as directed for 6 days  Dispense: 21 tablet; Refill: 0 - levofloxacin (LEVAQUIN) 500 MG tablet; Take 1 tablet (500 mg total) by mouth daily.  Dispense: 10 tablet; Refill: 0  2. Cough May take tussionex twice daily as needed for cough.  Advised patient not to overuse this medicine and not to mix with other medications or alcohol as it can cause respiratory distress, sleepiness or dizziness. Should also avoid driving. Patient voiced understanding and agreement.  - chlorpheniramine-HYDROcodone (TUSSIONEX PENNKINETIC ER) 10-8 MG/5ML SUER; Take 5 mLs by mouth every 12 (twelve) hours as needed for cough.  Dispense: 115 mL; Refill: 0  3. Contact with and (suspected) exposure to covid-19 Patient scheduled to have COVID 19 testing tomorrow. self-quarantine for 7 days and an additional 3 days after respiratory symptoms resolve. Everyone in his home should be vigilant about social distancing, and may benefit from self-quarantining for next 10 days. Take tylenol as needed for fever and headache. Contact PCP or ER if symptoms get worse or do not get better over next several days.   General Counseling: sameka meenach understanding of the findings of today's phone visit and agrees with plan of treatment. I have discussed any further  diagnostic evaluation that may be needed or ordered today. We also reviewed her medications today. she has been encouraged to call the office with any questions or concerns that should arise related to todays visit.    Rest and increase fluids. Continue using OTC medication to control symptoms.   This patient was seen by [place] with Dr Lavera Guise as a part of collaborative care agreement  Meds ordered this encounter  Medications  . methylPREDNISolone (MEDROL) 4 MG TBPK tablet    Sig: Take by mouth as directed for 6 days    Dispense:  21 tablet    Refill:  0    Order Specific Question:   Supervising Provider    Answer:   Lavera Guise Crellin  . chlorpheniramine-HYDROcodone (TUSSIONEX PENNKINETIC ER) 10-8 MG/5ML SUER    Sig: Take 5 mLs by mouth every 12 (twelve) hours as needed for cough.    Dispense:  115 mL    Refill:  0    Order Specific Question:   Supervising Provider    Answer:   Lavera Guise [id number]  . levofloxacin (LEVAQUIN) 500 MG tablet    Sig: Take 1 tablet (500 mg total) by mouth daily.    Dispense:  10 tablet    Refill:  0    Order Specific Question:   Supervising Provider    Answer:   Lavera Guise [id number]    Time spent: 48 Minutes    Dr Lavera Guise Internal medicine

## 2020-03-05 ENCOUNTER — Encounter: Payer: Self-pay | Admitting: Nurse Practitioner

## 2020-03-08 ENCOUNTER — Encounter: Payer: Self-pay | Admitting: Nurse Practitioner

## 2020-03-13 DIAGNOSIS — Z20822 Contact with and (suspected) exposure to covid-19: Secondary | ICD-10-CM | POA: Insufficient documentation

## 2020-03-28 ENCOUNTER — Encounter: Payer: Self-pay | Admitting: Nurse Practitioner

## 2020-03-29 ENCOUNTER — Encounter: Payer: Self-pay | Admitting: Nurse Practitioner

## 2020-04-13 DIAGNOSIS — G894 Chronic pain syndrome: Secondary | ICD-10-CM | POA: Diagnosis not present

## 2020-04-13 DIAGNOSIS — M792 Neuralgia and neuritis, unspecified: Secondary | ICD-10-CM | POA: Diagnosis not present

## 2020-04-13 DIAGNOSIS — G8928 Other chronic postprocedural pain: Secondary | ICD-10-CM | POA: Diagnosis not present

## 2020-04-13 DIAGNOSIS — Z79891 Long term (current) use of opiate analgesic: Secondary | ICD-10-CM | POA: Diagnosis not present

## 2020-05-04 ENCOUNTER — Other Ambulatory Visit: Payer: Self-pay | Admitting: Adult Health

## 2020-05-04 DIAGNOSIS — E782 Mixed hyperlipidemia: Secondary | ICD-10-CM

## 2020-06-14 ENCOUNTER — Encounter: Payer: Self-pay | Admitting: Nurse Practitioner

## 2020-06-14 ENCOUNTER — Ambulatory Visit: Payer: Medicare Other | Admitting: Nurse Practitioner

## 2020-06-14 ENCOUNTER — Other Ambulatory Visit: Payer: Self-pay

## 2020-06-14 VITALS — Temp 97.1°F | Resp 16 | Ht 64.0 in | Wt 234.0 lb

## 2020-06-14 DIAGNOSIS — R05 Cough: Secondary | ICD-10-CM

## 2020-06-14 DIAGNOSIS — F411 Generalized anxiety disorder: Secondary | ICD-10-CM | POA: Diagnosis not present

## 2020-06-14 DIAGNOSIS — J4520 Mild intermittent asthma, uncomplicated: Secondary | ICD-10-CM | POA: Diagnosis not present

## 2020-06-14 DIAGNOSIS — R059 Cough, unspecified: Secondary | ICD-10-CM

## 2020-06-14 MED ORDER — ALPRAZOLAM 1 MG PO TABS: 1.0000 mg | ORAL_TABLET | Freq: Every evening | ORAL | 2 refills | Status: DC | PRN

## 2020-06-14 MED ORDER — HYDROCOD POLST-CPM POLST ER 10-8 MG/5ML PO SUER: 5.0000 mL | Freq: Two times a day (BID) | ORAL | 0 refills | Status: DC | PRN

## 2020-06-14 NOTE — Progress Notes (Signed)
[facility] [place], [place] [address]  Internal MEDICINE  Telephone Visit  Patient Name: Janet Austin  [id number]  [id number]  Date of Service: 06/14/2020  I connected with the patient at 1:46pm by telephone and verified the patients identity using two identifiers.   I discussed the limitations, risks, security and privacy concerns of performing an evaluation and management service by telephone and the availability of in person appointments. I also discussed with the patient that there may be a patient responsible charge related to the service.  The patient expressed understanding and agrees to proceed.    Chief Complaint  Patient presents with  . Telephone Screen  . Telephone Assessment  . Follow-up    For medications  . URI    Negative covid test; just feeling cold-like symptoms; bad cough    The patient has been contacted via webcam for follow up visit due to concerns for spread of novel coronavirus. She presents for routine follow up visit. States that she has a cold. Her husband is currently hospitalized. He is neutropenic and requiring multiple blood transfusions. His nurse had a cold. The patient did get COVID 19 testing just to be safe. Results were negative.  She states that she is not feeling terrible, however the cough is terrible. She is congested. She denies fever, headache, or body aches at this time.  She needs to have routine, fasting labs drawn. A slip was given to her at last visit. She is "hard stick." Has been to the lab several times to get the labs but they were unable to get her blood. Will go back in few weeks so that results might be available for her CPE.  She does need to have refill for her alprazoalm.       Current Medication: Outpatient Encounter Medications as of 06/14/2020  Medication Sig  . albuterol (VENTOLIN HFA) 108 (90 Base) MCG/ACT inhaler Inhale 2 puffs into the lungs every 6 (six) hours as needed for wheezing or  shortness of breath.  . ALPRAZolam (XANAX) 1 MG tablet Take 1 tablet (1 mg total) by mouth at bedtime as needed for anxiety.  Marland Kitchen atorvastatin (LIPITOR) 10 MG tablet Take 1 tablet (10 mg total) by mouth daily.  . chlorpheniramine-HYDROcodone (TUSSIONEX PENNKINETIC ER) 10-8 MG/5ML SUER Take 5 mLs by mouth every 12 (twelve) hours as needed for cough.  . clobetasol ointment (TEMOVATE) 5.36 % Apply 1 application topically daily as needed.  . cyclobenzaprine (FLEXERIL) 5 MG tablet Take 5 mg by mouth 3 (three) times daily as needed for muscle spasms.  . fluconazole (DIFLUCAN) 150 MG tablet Take 1 tablet po once. May repeat dose in 3 days as needed for persistent symptoms.  . OxyCODONE HCl, Abuse Deter, (OXAYDO) 5 MG TABA Take by mouth.  . triamcinolone ointment (KENALOG) 0.5 % Apply 1 application topically 2 (two) times daily.  . valACYclovir (VALTREX) 1000 MG tablet Take 1 tablet (1,000 mg total) by mouth 2 (two) times daily.  Marland Kitchen zolpidem (AMBIEN) 10 MG tablet Take 1 tablet (10 mg total) by mouth at bedtime as needed for sleep.  . [DISCONTINUED] ALPRAZolam (XANAX) 1 MG tablet Take 1 tablet (1 mg total) by mouth at bedtime as needed for anxiety.  . [DISCONTINUED] chlorpheniramine-HYDROcodone (TUSSIONEX PENNKINETIC ER) 10-8 MG/5ML SUER Take 5 mLs by mouth every 12 (twelve) hours as needed for cough.  . [DISCONTINUED] levofloxacin (LEVAQUIN) 500 MG tablet Take 1 tablet (500 mg total) by mouth daily.  . [DISCONTINUED] methylPREDNISolone (  MEDROL) 4 MG TBPK tablet Take by mouth as directed for 6 days  . [DISCONTINUED] predniSONE (STERAPRED UNI-PAK 21 TAB) 10 MG (21) TBPK tablet 6 day taper - take by mouth as directed for 6 days   No facility-administered encounter medications on file as of 06/14/2020.    Surgical History: Past Surgical History:  Procedure Laterality Date  . ABDOMINAL HYSTERECTOMY    . BREAST SURGERY     double mastectomy  . BREAST SURGERY     reconstruction  . COLONOSCOPY WITH PROPOFOL  N/A 05/29/2016   Procedure: COLONOSCOPY WITH PROPOFOL;  Surgeon: Lollie Sails, MD;  Location: [facility] ENDOSCOPY;  Service: Endoscopy;  Laterality: N/A;  . KNEE ARTHROSCOPY Right   . MASTECTOMY      Medical History: Past Medical History:  Diagnosis Date  . Anxiety   . Arthritis   . Cancer ([place])    left breast  . Headache    migraines  . PONV (postoperative nausea and vomiting)     Family History: Family History  Problem Relation Age of Onset  . Hypertension Father     Social History   Socioeconomic History  . Marital status: Married    Spouse name: Not on file  . Number of children: Not on file  . Years of education: Not on file  . Highest education level: Not on file  Occupational History  . Not on file  Tobacco Use  . Smoking status: Never Smoker  . Smokeless tobacco: Never Used  Vaping Use  . Vaping Use: Never used  Substance and Sexual Activity  . Alcohol use: No  . Drug use: No  . Sexual activity: Not on file  Other Topics Concern  . Not on file  Social History Narrative  . Not on file   Social Determinants of Health   Financial Resource Strain:   . Difficulty of Paying Living Expenses:   Food Insecurity:   . Worried About Charity fundraiser in the Last Year:   . Arboriculturist in the Last Year:   Transportation Needs:   . Film/video editor (Medical):   Marland Kitchen Lack of Transportation (Non-Medical):   Physical Activity:   . Days of Exercise per Week:   . Minutes of Exercise per Session:   Stress:   . Feeling of Stress :   Social Connections:   . Frequency of Communication with Friends and Family:   . Frequency of Social Gatherings with Friends and Family:   . Attends Religious Services:   . Active Member of Clubs or Organizations:   . Attends Archivist Meetings:   Marland Kitchen Marital Status:   Intimate Partner Violence:   . Fear of Current or Ex-Partner:   . Emotionally Abused:   Marland Kitchen Physically Abused:   . Sexually Abused:        Review of Systems  Constitutional: Positive for fatigue. Negative for chills and unexpected weight change.  HENT: Positive for congestion and postnasal drip. Negative for rhinorrhea, sneezing and sore throat.   Respiratory: Positive for cough. Negative for chest tightness and shortness of breath.   Cardiovascular: Negative for chest pain and palpitations.  Gastrointestinal: Negative for abdominal pain, constipation, diarrhea, nausea and vomiting.  Endocrine: Negative for cold intolerance, heat intolerance, polydipsia and polyuria.  Musculoskeletal: Negative for arthralgias, back pain, joint swelling and neck pain.  Skin: Negative for rash.       Rosacea on the skin of face.   Allergic/Immunologic: Positive for environmental allergies.  Neurological: Negative for dizziness, tremors, numbness and headaches.  Hematological: Negative for adenopathy. Does not bruise/bleed easily.  Psychiatric/Behavioral: Positive for sleep disturbance. Negative for behavioral problems (Depression) and suicidal ideas. The patient is nervous/anxious.     Today's Vitals   06/14/20 1336  Resp: 16  Temp: (!) 97.1 F (36.2 C)  Weight: 234 lb (106.1 kg)  Height: 5\' 4"  (1.626 m)   Body mass index is 40.17 kg/m.  Observation/Objective:   The patient is alert and oriented. She is pleasant and answers all questions appropriately. Breathing is non-labored. She is in no acute distress at this time.  The patient appears flushed. She is congested and voice is hoarse.    Assessment/Plan: 1. Mild intermittent asthma without complication Use rescue inhaler as needed and as prescribed   2. Cough May take tussionex cough suppressant p to twice daily as needed for cough. Advised patient not to overuse this medicine and not to mix with other medications or alcohol as it can cause respiratory distress, sleepiness or dizziness. Should also avoid driving. Patient voiced understanding and agreement.  -  chlorpheniramine-HYDROcodone (TUSSIONEX PENNKINETIC ER) 10-8 MG/5ML SUER; Take 5 mLs by mouth every 12 (twelve) hours as needed for cough.  Dispense: 115 mL; Refill: 0  3. Generalized anxiety disorder May take alprazolam 1mg  at bedtime as needed for acute anxiety. New prescription sent to her pharmacy today.  - ALPRAZolam (XANAX) 1 MG tablet; Take 1 tablet (1 mg total) by mouth at bedtime as needed for anxiety.  Dispense: 90 tablet; Refill: 2  General Counseling: Blanca verbalizes understanding of the findings of today's phone visit and agrees with plan of treatment. I have discussed any further diagnostic evaluation that may be needed or ordered today. We also reviewed her medications today. she has been encouraged to call the office with any questions or concerns that should arise related to todays visit.   This patient was seen by [place] with Dr Lavera Guise as a part of collaborative care agreement  Meds ordered this encounter  Medications  . chlorpheniramine-HYDROcodone (TUSSIONEX PENNKINETIC ER) 10-8 MG/5ML SUER    Sig: Take 5 mLs by mouth every 12 (twelve) hours as needed for cough.    Dispense:  115 mL    Refill:  0    Order Specific Question:   Supervising Provider    Answer:   Lavera Guise [[id number]]  . ALPRAZolam (XANAX) 1 MG tablet    Sig: Take 1 tablet (1 mg total) by mouth at bedtime as needed for anxiety.    Dispense:  90 tablet    Refill:  2    This is 90 day prescription    Order Specific Question:   Supervising Provider    Answer:   Lavera Guise [[id number]]    Time spent: 51 Minutes    Dr Lavera Guise Internal medicine

## 2020-06-27 DIAGNOSIS — G894 Chronic pain syndrome: Secondary | ICD-10-CM | POA: Diagnosis not present

## 2020-06-27 DIAGNOSIS — Z79891 Long term (current) use of opiate analgesic: Secondary | ICD-10-CM | POA: Diagnosis not present

## 2020-06-27 DIAGNOSIS — M542 Cervicalgia: Secondary | ICD-10-CM | POA: Diagnosis not present

## 2020-06-27 DIAGNOSIS — M961 Postlaminectomy syndrome, not elsewhere classified: Secondary | ICD-10-CM | POA: Diagnosis not present

## 2020-07-28 ENCOUNTER — Other Ambulatory Visit: Payer: Self-pay | Admitting: Nurse Practitioner

## 2020-07-28 DIAGNOSIS — E559 Vitamin D deficiency, unspecified: Secondary | ICD-10-CM | POA: Diagnosis not present

## 2020-07-28 DIAGNOSIS — D509 Iron deficiency anemia, unspecified: Secondary | ICD-10-CM | POA: Diagnosis not present

## 2020-07-28 DIAGNOSIS — R5383 Other fatigue: Secondary | ICD-10-CM | POA: Diagnosis not present

## 2020-07-28 DIAGNOSIS — E782 Mixed hyperlipidemia: Secondary | ICD-10-CM | POA: Diagnosis not present

## 2020-07-29 LAB — CBC
Hematocrit: 43.5 % (ref 34.0–46.6)
Hemoglobin: 13.9 g/dL (ref 11.1–15.9)
MCH: 27.1 pg (ref 26.6–33.0)
MCHC: 32 g/dL (ref 31.5–35.7)
MCV: 85 fL (ref 79–97)
Platelets: 287 10*3/uL (ref 150–450)
RBC: 5.13 x10E6/uL (ref 3.77–5.28)
RDW: 13 % (ref 11.7–15.4)
WBC: 6.7 10*3/uL (ref 3.4–10.8)

## 2020-07-29 LAB — LIPID PANEL W/O CHOL/HDL RATIO
Cholesterol, Total: 181 mg/dL (ref 100–199)
HDL: 50 mg/dL (ref >39)
LDL Chol Calc (NIH): 106 mg/dL — ABNORMAL HIGH (ref 0–99)
Triglycerides: 139 mg/dL (ref 0–149)
VLDL Cholesterol Cal: 25 mg/dL (ref 5–40)

## 2020-07-29 LAB — IRON AND TIBC
Iron Saturation: 20 % (ref 15–55)
Iron: 65 ug/dL (ref 27–159)
Total Iron Binding Capacity: 329 ug/dL (ref 250–450)
UIBC: 264 ug/dL (ref 131–425)

## 2020-07-29 LAB — COMPREHENSIVE METABOLIC PANEL
ALT: 18 IU/L (ref 0–32)
AST: 15 IU/L (ref 0–40)
Albumin/Globulin Ratio: 2.1 (ref 1.2–2.2)
Albumin: 4.4 g/dL (ref 3.8–4.9)
Alkaline Phosphatase: 128 IU/L — ABNORMAL HIGH (ref 48–121)
BUN/Creatinine Ratio: 16 (ref 9–23)
BUN: 12 mg/dL (ref 6–24)
Bilirubin Total: 0.2 mg/dL (ref 0.0–1.2)
CO2: 22 mmol/L (ref 20–29)
Calcium: 9.1 mg/dL (ref 8.7–10.2)
Chloride: 104 mmol/L (ref 96–106)
Creatinine, Ser: 0.75 mg/dL (ref 0.57–1.00)
GFR calc Af Amer: 101 mL/min/{1.73_m2} (ref >59)
GFR calc non Af Amer: 88 mL/min/{1.73_m2} (ref >59)
Globulin, Total: 2.1 g/dL (ref 1.5–4.5)
Glucose: 132 mg/dL — ABNORMAL HIGH (ref 65–99)
Potassium: 4.4 mmol/L (ref 3.5–5.2)
Sodium: 139 mmol/L (ref 134–144)
Total Protein: 6.5 g/dL (ref 6.0–8.5)

## 2020-07-29 LAB — HGB A1C W/O EAG: Hgb A1c MFr Bld: 6.1 % — ABNORMAL HIGH (ref 4.8–5.6)

## 2020-07-29 LAB — B12 AND FOLATE PANEL
Folate: 15.9 ng/mL (ref >3.0)
Vitamin B-12: 401 pg/mL (ref 232–1245)

## 2020-07-29 LAB — T4, FREE: Free T4: 0.97 ng/dL (ref 0.82–1.77)

## 2020-07-29 LAB — TSH: TSH: 3.68 u[IU]/mL (ref 0.450–4.500)

## 2020-07-29 LAB — FERRITIN: Ferritin: 130 ng/mL (ref 15–150)

## 2020-07-29 LAB — VITAMIN D 25 HYDROXY (VIT D DEFICIENCY, FRACTURES): Vit D, 25-Hydroxy: 23.8 ng/mL — ABNORMAL LOW (ref 30.0–100.0)

## 2020-08-02 ENCOUNTER — Telehealth: Payer: Self-pay

## 2020-08-02 NOTE — Telephone Encounter (Signed)
Lmom to confirm and screen for 08-04-20 ov.

## 2020-08-04 ENCOUNTER — Ambulatory Visit: Payer: Medicare Other | Admitting: Nurse Practitioner

## 2020-08-04 ENCOUNTER — Encounter: Payer: Self-pay | Admitting: Nurse Practitioner

## 2020-08-04 VITALS — HR 96 | Temp 100.0°F | Ht 64.0 in | Wt 235.0 lb

## 2020-08-04 DIAGNOSIS — R059 Cough, unspecified: Secondary | ICD-10-CM

## 2020-08-04 DIAGNOSIS — Z20822 Contact with and (suspected) exposure to covid-19: Secondary | ICD-10-CM

## 2020-08-04 DIAGNOSIS — J069 Acute upper respiratory infection, unspecified: Secondary | ICD-10-CM | POA: Diagnosis not present

## 2020-08-04 DIAGNOSIS — R05 Cough: Secondary | ICD-10-CM

## 2020-08-04 MED ORDER — PREDNISONE 10 MG (21) PO TBPK: ORAL_TABLET | ORAL | 0 refills | Status: DC

## 2020-08-04 MED ORDER — LEVOFLOXACIN 500 MG PO TABS: 500.0000 mg | ORAL_TABLET | Freq: Every day | ORAL | 0 refills | Status: DC

## 2020-08-04 MED ORDER — HYDROCOD POLST-CPM POLST ER 10-8 MG/5ML PO SUER: 5.0000 mL | Freq: Two times a day (BID) | ORAL | 0 refills | Status: DC | PRN

## 2020-08-04 NOTE — Progress Notes (Signed)
[facility] [place], [place] [address]  Internal MEDICINE  Telephone Visit  Patient Name: Janet Austin  [id number]  [id number]  Date of Service: 08/04/2020  I connected with the patient at 2:06pm by webcam and verified the patients identity using two identifiers.   I discussed the limitations, risks, security and privacy concerns of performing an evaluation and management service by webcam and the availability of in person appointments. I also discussed with the patient that there may be a patient responsible charge related to the service.  The patient expressed understanding and agrees to proceed.    Chief Complaint  Patient presents with  . Telephone Screen    video -[id number]  . Telephone Assessment  . Cough  . Fever  . Sinusitis  . Nasal Congestion  . Headache    The patient has been contacted via webcam for follow up visit due to concerns for spread of novel coronavirus. The patient presents for acute visit.  Today, she states that she has fever 100.1. she has cough. Started as dry yesterday, now has some chest congestion. Hurts in her chest to cough. She states that she is very fatigued and just hurts all over. She states that she can still smell and taste everything. She did have COVID 19 test this morning and results are pending.       Current Medication: Outpatient Encounter Medications as of 08/04/2020  Medication Sig Note  . albuterol (VENTOLIN HFA) 108 (90 Base) MCG/ACT inhaler Inhale 2 puffs into the lungs every 6 (six) hours as needed for wheezing or shortness of breath.   . ALPRAZolam (XANAX) 1 MG tablet Take 1 tablet (1 mg total) by mouth at bedtime as needed for anxiety.   Marland Kitchen atorvastatin (LIPITOR) 10 MG tablet Take 1 tablet (10 mg total) by mouth daily.   . chlorpheniramine-HYDROcodone (TUSSIONEX PENNKINETIC ER) 10-8 MG/5ML SUER Take 5 mLs by mouth every 12 (twelve) hours as needed for cough.   . clobetasol ointment (TEMOVATE) 0.73 %  Apply 1 application topically daily as needed.   . cyclobenzaprine (FLEXERIL) 5 MG tablet Take 5 mg by mouth 3 (three) times daily as needed for muscle spasms.   . fluconazole (DIFLUCAN) 150 MG tablet Take 1 tablet po once. May repeat dose in 3 days as needed for persistent symptoms.   Marland Kitchen triamcinolone ointment (KENALOG) 0.5 % Apply 1 application topically 2 (two) times daily.   . valACYclovir (VALTREX) 1000 MG tablet Take 1 tablet (1,000 mg total) by mouth 2 (two) times daily.   Marland Kitchen zolpidem (AMBIEN) 10 MG tablet Take 1 tablet (10 mg total) by mouth at bedtime as needed for sleep.   . [DISCONTINUED] chlorpheniramine-HYDROcodone (TUSSIONEX PENNKINETIC ER) 10-8 MG/5ML SUER Take 5 mLs by mouth every 12 (twelve) hours as needed for cough.   . [DISCONTINUED] OxyCODONE HCl, Abuse Deter, (OXAYDO) 5 MG TABA Take by mouth. 08/04/2020: patient has weaned herself off of this medication.   Marland Kitchen levofloxacin (LEVAQUIN) 500 MG tablet Take 1 tablet (500 mg total) by mouth daily.   . predniSONE (STERAPRED UNI-PAK 21 TAB) 10 MG (21) TBPK tablet 6 day taper - take by mouth as directed for 6 days    No facility-administered encounter medications on file as of 08/04/2020.    Surgical History: Past Surgical History:  Procedure Laterality Date  . ABDOMINAL HYSTERECTOMY    . BREAST SURGERY     double mastectomy  . BREAST SURGERY     reconstruction  . COLONOSCOPY  WITH PROPOFOL N/A 05/29/2016   Procedure: COLONOSCOPY WITH PROPOFOL;  Surgeon: Lollie Sails, MD;  Location: [facility] ENDOSCOPY;  Service: Endoscopy;  Laterality: N/A;  . KNEE ARTHROSCOPY Right   . MASTECTOMY      Medical History: Past Medical History:  Diagnosis Date  . Anxiety   . Arthritis   . Cancer ([place])    left breast  . Headache    migraines  . PONV (postoperative nausea and vomiting)     Family History: Family History  Problem Relation Age of Onset  . Hypertension Father     Social History   Socioeconomic History  . Marital status:  Married    Spouse name: Not on file  . Number of children: Not on file  . Years of education: Not on file  . Highest education level: Not on file  Occupational History  . Not on file  Tobacco Use  . Smoking status: Never Smoker  . Smokeless tobacco: Never Used  Vaping Use  . Vaping Use: Never used  Substance and Sexual Activity  . Alcohol use: No  . Drug use: No  . Sexual activity: Not on file  Other Topics Concern  . Not on file  Social History Narrative  . Not on file   Social Determinants of Health   Financial Resource Strain:   . Difficulty of Paying Living Expenses: Not on file  Food Insecurity:   . Worried About Charity fundraiser in the Last Year: Not on file  . Ran Out of Food in the Last Year: Not on file  Transportation Needs:   . Lack of Transportation (Medical): Not on file  . Lack of Transportation (Non-Medical): Not on file  Physical Activity:   . Days of Exercise per Week: Not on file  . Minutes of Exercise per Session: Not on file  Stress:   . Feeling of Stress : Not on file  Social Connections:   . Frequency of Communication with Friends and Family: Not on file  . Frequency of Social Gatherings with Friends and Family: Not on file  . Attends Religious Services: Not on file  . Active Member of Clubs or Organizations: Not on file  . Attends Archivist Meetings: Not on file  . Marital Status: Not on file  Intimate Partner Violence:   . Fear of Current or Ex-Partner: Not on file  . Emotionally Abused: Not on file  . Physically Abused: Not on file  . Sexually Abused: Not on file      Review of Systems  Constitutional: Positive for fatigue and fever. Negative for chills and unexpected weight change.  HENT: Positive for congestion, postnasal drip, rhinorrhea and sore throat. Negative for sneezing.   Respiratory: Positive for cough and wheezing. Negative for chest tightness and shortness of breath.   Cardiovascular: Negative for chest pain  and palpitations.  Gastrointestinal: Positive for nausea. Negative for abdominal pain, constipation, diarrhea and vomiting.  Endocrine: Negative for cold intolerance, heat intolerance, polydipsia and polyuria.  Musculoskeletal: Positive for arthralgias and myalgias. Negative for back pain, joint swelling and neck pain.  Skin: Negative for rash.  Allergic/Immunologic: Positive for environmental allergies.  Neurological: Positive for headaches. Negative for dizziness, tremors and numbness.  Hematological: Positive for adenopathy. Does not bruise/bleed easily.  Psychiatric/Behavioral: Positive for sleep disturbance. Negative for behavioral problems (Depression) and suicidal ideas. The patient is not nervous/anxious.     Today's Vitals   08/04/20 1358  Pulse: 96  Temp: 100 F (37.8  C)  SpO2: 96%  Weight: 235 lb (106.6 kg)  Height: 5\' 4"  (1.626 m)   Body mass index is 40.34 kg/m.  Observation/Objective:   The patient is alert and oriented. She is pleasant and answers all questions appropriately. Breathing is non-labored. She is in no acute distress at this time.  The patient is nasally congested. She has intermittent dry cough present. She appears to feel poorly.    Assessment/Plan: 1. Acute upper respiratory infection Start levofloxacin 500mg  daily for 10 days. Add prednisone taper. Take as directed for 6 days. Rest and increase fluids. Use OTC medicatins as needed and as indicated.  - levofloxacin (LEVAQUIN) 500 MG tablet; Take 1 tablet (500 mg total) by mouth daily.  Dispense: 10 tablet; Refill: 0 - predniSONE (STERAPRED UNI-PAK 21 TAB) 10 MG (21) TBPK tablet; 6 day taper - take by mouth as directed for 6 days  Dispense: 21 tablet; Refill: 0  2. Cough May take tussionex twice daily as needed for cough. Advised patient not to overuse this medicine and not to mix with other medications or alcohol as it can cause respiratory distress, sleepiness or dizziness. Should also avoid driving.  Patient voiced understanding and agreement.  - chlorpheniramine-HYDROcodone (TUSSIONEX PENNKINETIC ER) 10-8 MG/5ML SUER; Take 5 mLs by mouth every 12 (twelve) hours as needed for cough.  Dispense: 115 mL; Refill: 0  3. Contact with and (suspected) exposure to covid-19 COVID 19 test done this morning. Awaiting results.   General Counseling: [address] understanding of the findings of today's phone visit and agrees with plan of treatment. I have discussed any further diagnostic evaluation that may be needed or ordered today. We also reviewed her medications today. she has been encouraged to call the office with any questions or concerns that should arise related to todays visit.   This patient was seen by [place] with Dr Lavera Guise as a part of collaborative care agreement  Meds ordered this encounter  Medications  . levofloxacin (LEVAQUIN) 500 MG tablet    Sig: Take 1 tablet (500 mg total) by mouth daily.    Dispense:  10 tablet    Refill:  0    Order Specific Question:   Supervising Provider    Answer:   Lavera Guise [[id number]]  . predniSONE (STERAPRED UNI-PAK 21 TAB) 10 MG (21) TBPK tablet    Sig: 6 day taper - take by mouth as directed for 6 days    Dispense:  21 tablet    Refill:  0    Order Specific Question:   Supervising Provider    Answer:   Lavera Guise [place]  . chlorpheniramine-HYDROcodone (TUSSIONEX PENNKINETIC ER) 10-8 MG/5ML SUER    Sig: Take 5 mLs by mouth every 12 (twelve) hours as needed for cough.    Dispense:  115 mL    Refill:  0    Order Specific Question:   Supervising Provider    Answer:   Lavera Guise [[id number]]    Time spent: 69 Minutes    Dr Lavera Guise Internal medicine

## 2020-08-14 ENCOUNTER — Encounter: Payer: Self-pay | Admitting: Nurse Practitioner

## 2020-08-16 ENCOUNTER — Encounter: Payer: Self-pay | Admitting: Nurse Practitioner

## 2020-08-24 ENCOUNTER — Encounter: Payer: Self-pay | Admitting: Adult Health

## 2020-08-24 ENCOUNTER — Ambulatory Visit: Payer: Medicare Other | Admitting: Adult Health

## 2020-08-24 ENCOUNTER — Other Ambulatory Visit: Payer: Self-pay

## 2020-08-24 VITALS — BP 126/90 | HR 95 | Temp 96.4°F | Ht 64.0 in | Wt 235.0 lb

## 2020-08-24 DIAGNOSIS — J301 Allergic rhinitis due to pollen: Secondary | ICD-10-CM

## 2020-08-24 DIAGNOSIS — J4520 Mild intermittent asthma, uncomplicated: Secondary | ICD-10-CM

## 2020-08-24 DIAGNOSIS — R0683 Snoring: Secondary | ICD-10-CM | POA: Diagnosis not present

## 2020-08-24 DIAGNOSIS — G4719 Other hypersomnia: Secondary | ICD-10-CM | POA: Diagnosis not present

## 2020-08-24 MED ORDER — BUDESONIDE-FORMOTEROL FUMARATE 160-4.5 MCG/ACT IN AERO: 2.0000 | INHALATION_SPRAY | Freq: Two times a day (BID) | RESPIRATORY_TRACT | 5 refills | Status: DC

## 2020-08-24 NOTE — Progress Notes (Signed)
@Patient  ID: Janet Austin, female    DOB: October 03, 1961, 59 y.o.   MRN: [id number]  Chief Complaint  Patient presents with  . Follow-up    Asthma     Referring provider: Lavera Guise, MD  HPI: 59 year old female never smoker followed for mild intermittent asthma, pulmonary nodules (serial follow up , resolved on CT chest 12/2016)  Medical history significant for breast cancer 2010 s/p bilateral mastectomy ,and chemo .    TEST/EVENTS :  Pulmonary function testing done July 2018 showed no airflow obstruction with FEV1 at 81%, ratio 86, FVC 77% no significant bronchodilator response, DLCO 52%, mid flow obstruction and reversibility noted  CXR 10/2016 clear lungs    08/24/2020 Follow up : Asthma Patient returns for a follow-up visit.  She was last seen March 2019. Patient says overall breathing has not been doing as well with increased cough , throat clearing, dry cough. Wheezing , shortness of breath . Has been going on since last visit  but getting more frequent in last year.  She is currently using ventolin 3-4 times a day . Has nasal stuffiness and drainage.  Had childhood asthma. Never been on a maintenance inhaler.  No GERD.   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    Last visit patient had complained of some daytime sleepiness and snoring with [place] at  14.  She was recommended for a sleep study however patient says she was unable to go due to family issues. Husband is sick and son ran away from home.  Symptoms are worse. Complains of increased daytime sleepiness, snoring , wakes self up gasping for air several times a night . Mouth dryness. Wakes up tired. Takes Ambien for sleep, can not go to sleep without it .  Rare caffeine. No signs of cataplexy or sleep paralysis .  Sleeps on average 4hr each night . Up with husband a lot during night .   Never smoker, has dog but for long time.   Allergies  Allergen Reactions  . Asa [Aspirin]   . Gabapentin Other (See  Comments)    Eye twitch  . Lyrica [Pregabalin] Other (See Comments)    Altered mental status  . Tape Rash    Patient states she had blisters and it was very itchy, ok to use paper tape and tegaderm     Immunization History  Administered Date(s) Administered  . Influenza-Unspecified 09/17/2019  . Janssen (J&J) SARS-COV-2 Vaccination 03/30/2020    Past Medical History:  Diagnosis Date  . Anxiety   . Arthritis   . Cancer ([place])    left breast  . Headache    migraines  . PONV (postoperative nausea and vomiting)     Tobacco History: Social History   Tobacco Use  Smoking Status Never Smoker  Smokeless Tobacco Never Used   Counseling given: Not Answered   Outpatient Medications Prior to Visit  Medication Sig Dispense Refill  . albuterol (VENTOLIN HFA) 108 (90 Base) MCG/ACT inhaler Inhale 2 puffs into the lungs every 6 (six) hours as needed for wheezing or shortness of breath. 54 g 3  . ALPRAZolam (XANAX) 1 MG tablet Take 1 tablet (1 mg total) by mouth at bedtime as needed for anxiety. 90 tablet 2  . atorvastatin (LIPITOR) 10 MG tablet Take 1 tablet (10 mg total) by mouth daily. 90 tablet 2  . chlorpheniramine-HYDROcodone (TUSSIONEX PENNKINETIC ER) 10-8 MG/5ML SUER Take 5 mLs by mouth every 12 (twelve) hours as needed for cough. [place]  mL 0  . clobetasol ointment (TEMOVATE) 3.24 % Apply 1 application topically daily as needed. 60 g 0  . cyclobenzaprine (FLEXERIL) 5 MG tablet Take 5 mg by mouth 3 (three) times daily as needed for muscle spasms.    Marland Kitchen triamcinolone ointment (KENALOG) 0.5 % Apply 1 application topically 2 (two) times daily. 30 g 0  . valACYclovir (VALTREX) 1000 MG tablet Take 1 tablet (1,000 mg total) by mouth 2 (two) times daily. 180 tablet 1  . zolpidem (AMBIEN) 10 MG tablet Take 1 tablet (10 mg total) by mouth at bedtime as needed for sleep. 90 tablet 1  . fluconazole (DIFLUCAN) 150 MG tablet Take 1 tablet po once. May repeat dose in 3 days as needed for persistent  symptoms. (Patient not taking: Reported on 08/24/2020) 3 tablet 0  . levofloxacin (LEVAQUIN) 500 MG tablet Take 1 tablet (500 mg total) by mouth daily. (Patient not taking: Reported on 08/24/2020) 10 tablet 0  . predniSONE (STERAPRED UNI-PAK 21 TAB) 10 MG (21) TBPK tablet 6 day taper - take by mouth as directed for 6 days (Patient not taking: Reported on 08/24/2020) 21 tablet 0   No facility-administered medications prior to visit.     Review of Systems:   Constitutional:   No  weight loss, night sweats,  Fevers, chills, + fatigue, or  lassitude.  HEENT:   No headaches,  Difficulty swallowing,  Tooth/dental problems, or  Sore throat,                No sneezing, itching, ear ache, +nasal congestion   CV:  No chest pain,  Orthopnea, PND, swelling in lower extremities, anasarca, dizziness, palpitations, syncope.   GI  No heartburn, indigestion, abdominal pain, nausea, vomiting, diarrhea, change in bowel habits, loss of appetite, bloody stools.   Resp:    No chest wall deformity  Skin: no rash or lesions.  GU: no dysuria, change in color of urine, no urgency or frequency.  No flank pain, no hematuria   MS:  No joint pain or swelling.  No decreased range of motion.  No back pain.    Physical Exam  BP 126/90 (BP Location: Right Arm, Cuff Size: Large)   Pulse (!) 110   Temp (!) 96.4 F (35.8 C) (Temporal)   Ht 5\' 4"  (1.626 m)   Wt 235 lb (106.6 kg)   SpO2 96%   BMI 40.34 kg/m   GEN: A/Ox3; pleasant , NAD, well nourished    HEENT:  [place]/AT,   NOSE-clear, THROAT-clear, no lesions, no postnasal drip or exudate noted. Class 2 MP airway   NECK:  Supple w/ fair ROM; no JVD; normal carotid impulses w/o bruits; no thyromegaly or nodules palpated; no lymphadenopathy.    RESP  Clear  P & A; w/o, wheezes/ rales/ or rhonchi. no accessory muscle use, no dullness to percussion  CARD:  RRR, no m/r/g, no peripheral edema, pulses intact, no cyanosis or clubbing.  GI:   Soft & nt; nml bowel  sounds; no organomegaly or masses detected.   Musco: Warm bil, no deformities or joint swelling noted.   Neuro: alert, no focal deficits noted.    Skin: Warm, no lesions or rashes    Lab Results:  CBC  BNP No results found for: BNP  ProBNP No results found for: PROBNP  Imaging: No results found.    No flowsheet data found.  No results found for: NITRICOXIDE      Assessment & Plan:   Mild intermittent  asthma without complication Increased asthma symptoms.  We will begin a trial of ICS/LABA combo as her maintenance inhaler.  Control for triggers. If not improved on return consider Chest xray and repeat PFT , labs.   Plan  Patient Instructions  Begin Symbicort  2 puffs Twice daily  , rinse after use.  Ventolin inhaler 1-2 puffs every 6hrs as needed  Begin Zyrtec 10mg  At bedtime   Delsym 2 tsp Twice daily  As needed  Cough    Set up for Home sleep study .  Healthy sleep regimen .   Follow up with Dr. Mortimer Fries in 2-3 months and As needed             Seasonal allergic rhinitis due to pollen Control for triggers  -begin zyrtec   Plan  Patient Instructions  Begin Symbicort  2 puffs Twice daily  , rinse after use.  Ventolin inhaler 1-2 puffs every 6hrs as needed  Begin Zyrtec 10mg  At bedtime   Delsym 2 tsp Twice daily  As needed  Cough    Set up for Home sleep study .  Healthy sleep regimen .   Follow up with Dr. Mortimer Fries in 2-3 months and As needed             Daytime hypersomnolence Daytime hypersomnolence, snoring gasping for air during sleep or highly suspicious for possible underlying obstructive sleep apnea.  Patient education on sleep apnea and potential complications of untreated sleep apnea discussed. Set up for home sleep study Healthy sleep regimen discussed Plan  Patient Instructions  Begin Symbicort  2 puffs Twice daily  , rinse after use.  Ventolin inhaler 1-2 puffs every 6hrs as needed  Begin Zyrtec 10mg  At bedtime   Delsym 2  tsp Twice daily  As needed  Cough    Set up for Home sleep study .  Healthy sleep regimen .   Follow up with Dr. Mortimer Fries in 2-3 months and As needed                Rexene Edison, NP 08/24/2020

## 2020-08-24 NOTE — Assessment & Plan Note (Signed)
Control for triggers  -begin zyrtec   Plan  Patient Instructions  Begin Symbicort  2 puffs Twice daily  , rinse after use.  Ventolin inhaler 1-2 puffs every 6hrs as needed  Begin Zyrtec 10mg  At bedtime   Delsym 2 tsp Twice daily  As needed  Cough    Set up for Home sleep study .  Healthy sleep regimen .   Follow up with Dr. Mortimer Fries in 2-3 months and As needed

## 2020-08-24 NOTE — Patient Instructions (Addendum)
Begin Symbicort  2 puffs Twice daily  , rinse after use.  Ventolin inhaler 1-2 puffs every 6hrs as needed  Begin Zyrtec 10mg  At bedtime   Delsym 2 tsp Twice daily  As needed  Cough    Set up for Home sleep study .  Healthy sleep regimen .   Follow up with Dr. Mortimer Fries in 2-3 months and As needed

## 2020-08-24 NOTE — Assessment & Plan Note (Addendum)
Increased asthma symptoms.  We will begin a trial of ICS/LABA combo as her maintenance inhaler.  Control for triggers. If not improved on return consider Chest xray and repeat PFT , labs.   Plan  Patient Instructions  Begin Symbicort  2 puffs Twice daily  , rinse after use.  Ventolin inhaler 1-2 puffs every 6hrs as needed  Begin Zyrtec 10mg  At bedtime   Delsym 2 tsp Twice daily  As needed  Cough    Set up for Home sleep study .  Healthy sleep regimen .   Follow up with Dr. Mortimer Fries in 2-3 months and As needed

## 2020-08-24 NOTE — Assessment & Plan Note (Signed)
Daytime hypersomnolence, snoring gasping for air during sleep or highly suspicious for possible underlying obstructive sleep apnea.  Patient education on sleep apnea and potential complications of untreated sleep apnea discussed. Set up for home sleep study Healthy sleep regimen discussed Plan  Patient Instructions  Begin Symbicort  2 puffs Twice daily  , rinse after use.  Ventolin inhaler 1-2 puffs every 6hrs as needed  Begin Zyrtec 10mg  At bedtime   Delsym 2 tsp Twice daily  As needed  Cough    Set up for Home sleep study .  Healthy sleep regimen .   Follow up with Dr. Mortimer Fries in 2-3 months and As needed

## 2020-09-20 ENCOUNTER — Other Ambulatory Visit: Payer: Self-pay

## 2020-09-20 DIAGNOSIS — G4733 Obstructive sleep apnea (adult) (pediatric): Secondary | ICD-10-CM | POA: Diagnosis not present

## 2020-09-20 DIAGNOSIS — R0683 Snoring: Secondary | ICD-10-CM

## 2020-09-22 DIAGNOSIS — G4733 Obstructive sleep apnea (adult) (pediatric): Secondary | ICD-10-CM | POA: Diagnosis not present

## 2020-09-23 ENCOUNTER — Ambulatory Visit: Payer: Medicare Other | Admitting: Adult Health

## 2020-09-23 ENCOUNTER — Encounter: Payer: Self-pay | Admitting: Adult Health

## 2020-09-23 VITALS — Temp 99.5°F | Ht 64.0 in | Wt 234.0 lb

## 2020-09-23 DIAGNOSIS — J0110 Acute frontal sinusitis, unspecified: Secondary | ICD-10-CM

## 2020-09-23 DIAGNOSIS — Z20822 Contact with and (suspected) exposure to covid-19: Secondary | ICD-10-CM | POA: Diagnosis not present

## 2020-09-23 MED ORDER — PREDNISONE 10 MG PO TABS: ORAL_TABLET | ORAL | 0 refills | Status: DC

## 2020-09-23 MED ORDER — AMOXICILLIN-POT CLAVULANATE 875-125 MG PO TABS: 1.0000 | ORAL_TABLET | Freq: Two times a day (BID) | ORAL | 0 refills | Status: DC

## 2020-09-23 NOTE — Progress Notes (Signed)
[facility] [place], [place] [address]  Internal MEDICINE  Telephone Visit  Patient Name: Janet Austin  [id number]  [id number]  Date of Service: 09/23/2020  I connected with the patient at 936 by video and verified the patients identity using two identifiers.   I discussed the limitations, risks, security and privacy concerns of performing an evaluation and management service by telephone and the availability of in person appointments. I also discussed with the patient that there may be a patient responsible charge related to the service.  The patient expressed understanding and agrees to proceed.    Chief Complaint  Patient presents with   Telephone Assessment    [id number]   Telephone Screen   Cough   Sinusitis   Fever   Fatigue    HPI  PT is seen via video.  She reports 3 days of cough, congestion, low grade fever, and fatigue.  She also reports some right ear fullness.  She has been vaccinated for covid, and has not been around anyone that she knows has tested positive.  She has some mild sob when walking around.  She has a pulse ox that is showing 95%.  She has an appt to get covid tested today at noon.     Current Medication: Outpatient Encounter Medications as of 09/23/2020  Medication Sig   albuterol (VENTOLIN HFA) 108 (90 Base) MCG/ACT inhaler Inhale 2 puffs into the lungs every 6 (six) hours as needed for wheezing or shortness of breath.   ALPRAZolam (XANAX) 1 MG tablet Take 1 tablet (1 mg total) by mouth at bedtime as needed for anxiety.   atorvastatin (LIPITOR) 10 MG tablet Take 1 tablet (10 mg total) by mouth daily.   budesonide-formoterol (SYMBICORT) 160-4.5 MCG/ACT inhaler Inhale 2 puffs into the lungs 2 (two) times daily.   clobetasol ointment (TEMOVATE) 5.39 % Apply 1 application topically daily as needed.   cyclobenzaprine (FLEXERIL) 5 MG tablet Take 5 mg by mouth 3 (three) times daily as needed for muscle spasms.    triamcinolone ointment (KENALOG) 0.5 % Apply 1 application topically 2 (two) times daily.   valACYclovir (VALTREX) 1000 MG tablet Take 1 tablet (1,000 mg total) by mouth 2 (two) times daily.   zolpidem (AMBIEN) 10 MG tablet Take 1 tablet (10 mg total) by mouth at bedtime as needed for sleep.   amoxicillin-clavulanate (AUGMENTIN) 875-125 MG tablet Take 1 tablet by mouth 2 (two) times daily.   predniSONE (DELTASONE) 10 MG tablet Use per dose pack   [DISCONTINUED] chlorpheniramine-HYDROcodone (TUSSIONEX PENNKINETIC ER) 10-8 MG/5ML SUER Take 5 mLs by mouth every 12 (twelve) hours as needed for cough. (Patient not taking: Reported on 09/23/2020)   No facility-administered encounter medications on file as of 09/23/2020.    Surgical History: Past Surgical History:  Procedure Laterality Date   ABDOMINAL HYSTERECTOMY     BREAST SURGERY     double mastectomy   BREAST SURGERY     reconstruction   COLONOSCOPY WITH PROPOFOL N/A 05/29/2016   Procedure: COLONOSCOPY WITH PROPOFOL;  Surgeon: Lollie Sails, MD;  Location: [facility] ENDOSCOPY;  Service: Endoscopy;  Laterality: N/A;   KNEE ARTHROSCOPY Right    MASTECTOMY      Medical History: Past Medical History:  Diagnosis Date   Anxiety    Arthritis    Cancer ([place])    left breast   Headache    migraines   PONV (postoperative nausea and vomiting)     Family History: Family History  Problem Relation Age of Onset   Hypertension Father     Social History   Socioeconomic History   Marital status: Married    Spouse name: Not on file   Number of children: Not on file   Years of education: Not on file   Highest education level: Not on file  Occupational History   Not on file  Tobacco Use   Smoking status: Never Smoker   Smokeless tobacco: Never Used  Vaping Use   Vaping Use: Never used  Substance and Sexual Activity   Alcohol use: No   Drug use: No   Sexual activity: Not on file  Other Topics Concern    Not on file  Social History Narrative   Not on file   Social Determinants of Health   Financial Resource Strain:    Difficulty of Paying Living Expenses: Not on file  Food Insecurity:    Worried About [place] in the Last Year: Not on file   Ran Out of Food in the Last Year: Not on file  Transportation Needs:    Lack of Transportation (Medical): Not on file   Lack of Transportation (Non-Medical): Not on file  Physical Activity:    Days of Exercise per Week: Not on file   Minutes of Exercise per Session: Not on file  Stress:    Feeling of Stress : Not on file  Social Connections:    Frequency of Communication with Friends and Family: Not on file   Frequency of Social Gatherings with Friends and Family: Not on file   Attends Religious Services: Not on file   Active Member of Clubs or Organizations: Not on file   Attends Archivist Meetings: Not on file   Marital Status: Not on file  Intimate Partner Violence:    Fear of Current or Ex-Partner: Not on file   Emotionally Abused: Not on file   Physically Abused: Not on file   Sexually Abused: Not on file      Review of Systems  Constitutional: Negative for chills, fatigue and unexpected weight change.  HENT: Negative for congestion, rhinorrhea, sneezing and sore throat.   Eyes: Negative for photophobia, pain and redness.  Respiratory: Positive for cough, choking and shortness of breath. Negative for chest tightness.   Cardiovascular: Negative for chest pain and palpitations.  Gastrointestinal: Negative for abdominal pain, constipation, diarrhea, nausea and vomiting.  Endocrine: Negative.   Genitourinary: Negative for dysuria and frequency.  Musculoskeletal: Negative for arthralgias, back pain, joint swelling and neck pain.  Skin: Negative for rash.  Allergic/Immunologic: Negative.   Neurological: Negative for tremors and numbness.  Hematological: Negative for adenopathy. Does not  bruise/bleed easily.  Psychiatric/Behavioral: Negative for behavioral problems and sleep disturbance. The patient is not nervous/anxious.     Vital Signs: Temp 99.5 F (37.5 C)    Ht 5\' 4"  (1.626 m)    Wt 234 lb (106.1 kg)    BMI 40.17 kg/m    Observation/Objective:  Ill appearing, NAD noted.    Assessment/Plan: 1. Subacute frontal sinusitis Advised patient to take entire course of antibiotics as prescribed with food. Pt should return to clinic in 7-10 days if symptoms fail to improve or new symptoms develop.  - amoxicillin-clavulanate (AUGMENTIN) 875-125 MG tablet; Take 1 tablet by mouth 2 (two) times daily.  Dispense: 14 tablet; Refill: 0 - predniSONE (DELTASONE) 10 MG tablet; Use per dose pack  Dispense: 21 tablet; Refill: 0  2. Encounter by telehealth for suspected  COVID-19 Get covid test as scheduled and call us with results.   General Counseling: ryliegh mcduffey understanding of the findings of today's phone visit and agrees with plan of treatment. I have discussed any further diagnostic evaluation that may be needed or ordered today. We also reviewed her medications today. she has been encouraged to call the office with any questions or concerns that should arise related to todays visit.    No orders of the defined types were placed in this encounter.   Meds ordered this encounter  Medications   amoxicillin-clavulanate (AUGMENTIN) 875-125 MG tablet    Sig: Take 1 tablet by mouth 2 (two) times daily.    Dispense:  14 tablet    Refill:  0   predniSONE (DELTASONE) 10 MG tablet    Sig: Use per dose pack    Dispense:  21 tablet    Refill:  0    Time spent: [place] AGNP-C Internal medicine

## 2020-09-26 ENCOUNTER — Telehealth: Payer: Self-pay | Admitting: Adult Health

## 2020-09-26 NOTE — Telephone Encounter (Signed)
Please let patient know that she has moderate obstructive sleep apnea:  Home sleep study from 09/20/20 showed moderate obstructive sleep apnea with AHI of 19.4 and SpO2 low of 75%. Spent 195.1 minutes of test time with SpO2 < 89%,   Concern with possible OHS also. Should have in lab titration study.   Please set up for a CPAP titration study (in order comments please write CPAP titration and if needed BiPAP titration if indicated)  We can discuss her test results via an office visit or telehealth/video visit with Dr.  Mortimer Fries If she would like or she can proceed with titration study as recommended.

## 2020-09-27 NOTE — Telephone Encounter (Signed)
Lm for patient.  

## 2020-09-29 NOTE — Telephone Encounter (Signed)
Lm x2 for patient. Letter has been mailed to address on file.  Will close encounter per office protocol.   

## 2020-10-03 ENCOUNTER — Ambulatory Visit: Payer: Medicare Other | Admitting: Adult Health

## 2020-10-03 ENCOUNTER — Ambulatory Visit: Payer: Medicare Other | Admitting: Nurse Practitioner

## 2020-10-03 ENCOUNTER — Other Ambulatory Visit: Payer: Self-pay

## 2020-10-03 ENCOUNTER — Encounter: Payer: Self-pay | Admitting: Adult Health

## 2020-10-03 VITALS — HR 85 | Temp 99.5°F | Ht 64.0 in | Wt 234.0 lb

## 2020-10-03 DIAGNOSIS — J988 Other specified respiratory disorders: Secondary | ICD-10-CM

## 2020-10-03 DIAGNOSIS — Z20822 Contact with and (suspected) exposure to covid-19: Secondary | ICD-10-CM

## 2020-10-03 MED ORDER — PREDNISONE 10 MG PO TABS: ORAL_TABLET | ORAL | 0 refills | Status: DC

## 2020-10-03 MED ORDER — AZITHROMYCIN 250 MG PO TABS: ORAL_TABLET | ORAL | 0 refills | Status: DC

## 2020-10-03 NOTE — Progress Notes (Signed)
[facility] [place],  [address]  Internal MEDICINE  Telephone Visit  Patient Name: Janet Austin  [id number]  [id number]  Date of Service: 10/04/2020  I connected with the patient at 200 by telephone and verified the patients identity using two identifiers.   I discussed the limitations, risks, security and privacy concerns of performing an evaluation and management service by telephone and the availability of in person appointments. I also discussed with the patient that there may be a patient responsible charge related to the service.  The patient expressed understanding and agrees to proceed.    Chief Complaint  Patient presents with  . Telephone Assessment    telephone [phone]  . Telephone Screen  . Cough    feels she has pneumonia,  covid tested 09/23/20 negative has appt at 230 pm today, since the 09/21/20  . Nasal Congestion    upper respiratory  . sneezing  . Fatigue    HPI  Pt is seen for follow up on continued sob, wheezing, and coughing. She has completed a course of antibiotics, and prednisone.  Once she finished the medication the symptoms returned.  Has appointment for covid testing today.    Current Medication: Outpatient Encounter Medications as of 10/03/2020  Medication Sig  . albuterol (VENTOLIN HFA) 108 (90 Base) MCG/ACT inhaler Inhale 2 puffs into the lungs every 6 (six) hours as needed for wheezing or shortness of breath.  . ALPRAZolam (XANAX) 1 MG tablet Take 1 tablet (1 mg total) by mouth at bedtime as needed for anxiety.  Marland Kitchen atorvastatin (LIPITOR) 10 MG tablet Take 1 tablet (10 mg total) by mouth daily.  . budesonide-formoterol (SYMBICORT) 160-4.5 MCG/ACT inhaler Inhale 2 puffs into the lungs 2 (two) times daily.  . clobetasol ointment (TEMOVATE) 7.61 % Apply 1 application topically daily as needed.  . cyclobenzaprine (FLEXERIL) 5 MG tablet Take 5 mg by mouth 3 (three) times daily as needed for muscle spasms.  Marland Kitchen  triamcinolone ointment (KENALOG) 0.5 % Apply 1 application topically 2 (two) times daily.  . valACYclovir (VALTREX) 1000 MG tablet Take 1 tablet (1,000 mg total) by mouth 2 (two) times daily.  Marland Kitchen zolpidem (AMBIEN) 10 MG tablet Take 1 tablet (10 mg total) by mouth at bedtime as needed for sleep.  Marland Kitchen azithromycin (ZITHROMAX) 250 MG tablet Take 500mg  on first day, and take once daily for remaining pills.  . predniSONE (DELTASONE) 10 MG tablet Use per dose pack  . [DISCONTINUED] amoxicillin-clavulanate (AUGMENTIN) 875-125 MG tablet Take 1 tablet by mouth 2 (two) times daily. (Patient not taking: Reported on 10/03/2020)  . [DISCONTINUED] predniSONE (DELTASONE) 10 MG tablet Use per dose pack (Patient not taking: Reported on 10/03/2020)   No facility-administered encounter medications on file as of 10/03/2020.    Surgical History: Past Surgical History:  Procedure Laterality Date  . ABDOMINAL HYSTERECTOMY    . BREAST SURGERY     double mastectomy  . BREAST SURGERY     reconstruction  . COLONOSCOPY WITH PROPOFOL N/A 05/29/2016   Procedure: COLONOSCOPY WITH PROPOFOL;  Surgeon: Lollie Sails, MD;  Location: [facility] ENDOSCOPY;  Service: Endoscopy;  Laterality: N/A;  . KNEE ARTHROSCOPY Right   . MASTECTOMY      Medical History: Past Medical History:  Diagnosis Date  . Anxiety   . Arthritis   . Cancer ([place])    left breast  . Headache    migraines  . PONV (postoperative nausea and vomiting)   . Sleep apnea  Family History: Family History  Problem Relation Age of Onset  . Hypertension Father     Social History   Socioeconomic History  . Marital status: Married    Spouse name: Not on file  . Number of children: Not on file  . Years of education: Not on file  . Highest education level: Not on file  Occupational History  . Not on file  Tobacco Use  . Smoking status: Never Smoker  . Smokeless tobacco: Never Used  Vaping Use  . Vaping Use: Never used  Substance and Sexual  Activity  . Alcohol use: No  . Drug use: No  . Sexual activity: Not on file  Other Topics Concern  . Not on file  Social History Narrative  . Not on file   Social Determinants of Health   Financial Resource Strain:   . Difficulty of Paying Living Expenses: Not on file  Food Insecurity:   . Worried About Charity fundraiser in the Last Year: Not on file  . Ran Out of Food in the Last Year: Not on file  Transportation Needs:   . Lack of Transportation (Medical): Not on file  . Lack of Transportation (Non-Medical): Not on file  Physical Activity:   . Days of Exercise per Week: Not on file  . Minutes of Exercise per Session: Not on file  Stress:   . Feeling of Stress : Not on file  Social Connections:   . Frequency of Communication with Friends and Family: Not on file  . Frequency of Social Gatherings with Friends and Family: Not on file  . Attends Religious Services: Not on file  . Active Member of Clubs or Organizations: Not on file  . Attends Archivist Meetings: Not on file  . Marital Status: Not on file  Intimate Partner Violence:   . Fear of Current or Ex-Partner: Not on file  . Emotionally Abused: Not on file  . Physically Abused: Not on file  . Sexually Abused: Not on file      Review of Systems  Constitutional: Negative for chills, fatigue and unexpected weight change.  HENT: Negative for congestion, rhinorrhea, sneezing and sore throat.   Eyes: Negative for photophobia, pain and redness.  Respiratory: Negative for cough, chest tightness and shortness of breath.   Cardiovascular: Negative for chest pain and palpitations.  Gastrointestinal: Negative for abdominal pain, constipation, diarrhea, nausea and vomiting.  Endocrine: Negative.   Genitourinary: Negative for dysuria and frequency.  Musculoskeletal: Negative for arthralgias, back pain, joint swelling and neck pain.  Skin: Negative for rash.  Allergic/Immunologic: Negative.   Neurological:  Negative for tremors and numbness.  Hematological: Negative for adenopathy. Does not bruise/bleed easily.  Psychiatric/Behavioral: Negative for behavioral problems and sleep disturbance. The patient is not nervous/anxious.     Vital Signs: Pulse 85   Temp 99.5 F (37.5 C)   Ht 5\' 4"  (1.626 m)   Wt 234 lb (106.1 kg)   SpO2 96%   BMI 40.17 kg/m    Observation/Objective:  Ill sounding, nad noted.    Assessment/Plan: 1. Respiratory infection Advised patient to take entire course of antibiotics as prescribed with food. Pt should return to clinic in 7-10 days if symptoms fail to improve or new symptoms develop.  - DG Chest 2 View; Future - azithromycin (ZITHROMAX) 250 MG tablet; Take 500mg  on first day, and take once daily for remaining pills.  Dispense: 11 tablet; Refill: 0 - predniSONE (DELTASONE) 10 MG tablet; Use per  dose pack  Dispense: 21 tablet; Refill: 0  2. Close exposure to COVID-19 virus Getting covid test today, will call with results.   General Counseling: kameko hukill understanding of the findings of today's phone visit and agrees with plan of treatment. I have discussed any further diagnostic evaluation that may be needed or ordered today. We also reviewed her medications today. she has been encouraged to call the office with any questions or concerns that should arise related to todays visit.    Orders Placed This Encounter  Procedures  . DG Chest 2 View    Meds ordered this encounter  Medications  . azithromycin (ZITHROMAX) 250 MG tablet    Sig: Take 500mg  on first day, and take once daily for remaining pills.    Dispense:  11 tablet    Refill:  0  . predniSONE (DELTASONE) 10 MG tablet    Sig: Use per dose pack    Dispense:  21 tablet    Refill:  0    Time spent: [place] AGNP-C Internal medicine

## 2020-10-04 ENCOUNTER — Telehealth: Payer: Self-pay | Admitting: Adult Health

## 2020-10-04 DIAGNOSIS — G4733 Obstructive sleep apnea (adult) (pediatric): Secondary | ICD-10-CM

## 2020-10-04 NOTE — Telephone Encounter (Signed)
Pt gives verbal consent to leave detailed vm on answering machine if no answer//TM

## 2020-10-04 NOTE — Telephone Encounter (Signed)
Please let patient know that she has moderate obstructive sleep apnea:  Home sleep study from 09/20/20 showed moderate obstructive sleep apnea with AHI of 19.4 and SpO2 low of 75%. Spent 195.1 minutes of test time with SpO2 < 89%,   Concern with possible OHS also. Should have in lab titration study.   Please set up for a CPAP titration study (in order comments please write CPAP titration and if needed BiPAP titration if indicated)  We can discuss her test results via an office visit or telehealth/video visit with Dr.  Mortimer Fries If she would like or she can proceed with titration study as recommended.    Patient is aware of results and voiced her understanding.  Order for cpap titration has been ordered, as patient was agreeable. appt scheduled with Dr. Mortimer Fries for 11/17/2020. Nothing further is needed.

## 2020-10-05 ENCOUNTER — Ambulatory Visit
Admission: RE | Admit: 2020-10-05 | Discharge: 2020-10-05 | Disposition: A | Discharge status: Home / Self Care | Payer: Medicare Other | Attending: Adult Health | Admitting: Adult Health

## 2020-10-05 ENCOUNTER — Other Ambulatory Visit: Payer: Self-pay

## 2020-10-05 ENCOUNTER — Ambulatory Visit
Admission: RE | Admit: 2020-10-05 | Discharge: 2020-10-05 | Disposition: A | Discharge status: Home / Self Care | Payer: Medicare Other | Source: Ambulatory Visit | Attending: Adult Health | Admitting: Adult Health

## 2020-10-05 DIAGNOSIS — J988 Other specified respiratory disorders: Secondary | ICD-10-CM | POA: Diagnosis not present

## 2020-10-05 DIAGNOSIS — R059 Cough, unspecified: Secondary | ICD-10-CM | POA: Diagnosis not present

## 2020-10-06 ENCOUNTER — Telehealth: Payer: Self-pay

## 2020-10-06 NOTE — Telephone Encounter (Signed)
PT notified

## 2020-10-06 NOTE — Telephone Encounter (Signed)
-----   Message from Lavera Guise, MD sent at 10/06/2020 10:02 AM EDT ----- CXR looks good, if pt continues to be having problems , can be seen by Dr

## 2020-10-06 NOTE — Progress Notes (Signed)
CXR looks good, if pt continues to be having problems , can be seen by Dr

## 2020-10-10 ENCOUNTER — Telehealth: Payer: Self-pay | Admitting: Internal Medicine

## 2020-10-10 NOTE — Telephone Encounter (Signed)
Lm for patient.  

## 2020-10-10 NOTE — Telephone Encounter (Signed)
Spoke to patient, who is requesting update on cpap titration.  She is aware that [facility] with contact her to schedule. Advised patient to contact our office if she has not been contacted by [facility] in the next week.  Patient voiced her understanding and had no further questions. Nothing further needed.

## 2020-10-10 NOTE — Telephone Encounter (Signed)
Pt returning missed call [phone] (

## 2020-10-17 ENCOUNTER — Ambulatory Visit: Payer: Medicare Other | Attending: Pulmonary Disease

## 2020-10-17 DIAGNOSIS — G4733 Obstructive sleep apnea (adult) (pediatric): Secondary | ICD-10-CM | POA: Insufficient documentation

## 2020-10-18 ENCOUNTER — Telehealth: Payer: Self-pay | Admitting: Internal Medicine

## 2020-10-18 NOTE — Telephone Encounter (Signed)
Ok

## 2020-10-18 NOTE — Telephone Encounter (Signed)
Spoke to patient, who wanted to make Dr. Mortimer Fries aware that she did not sleep well during titration study last night.

## 2020-10-18 NOTE — Telephone Encounter (Signed)
Noted.  Will close encounter.  

## 2020-10-21 ENCOUNTER — Other Ambulatory Visit: Payer: Self-pay

## 2020-11-02 ENCOUNTER — Telehealth (INDEPENDENT_AMBULATORY_CARE_PROVIDER_SITE_OTHER): Payer: Medicare Other | Admitting: Pulmonary Disease

## 2020-11-02 DIAGNOSIS — G4733 Obstructive sleep apnea (adult) (pediatric): Secondary | ICD-10-CM

## 2020-11-02 NOTE — Telephone Encounter (Signed)
CPAP titration >> only 37 mins of sleep noted  , suboptimal SUggest autoCPAP 5-12 cm with nasal pillows

## 2020-11-03 ENCOUNTER — Telehealth: Payer: Self-pay | Admitting: Pulmonary Disease

## 2020-11-03 NOTE — Telephone Encounter (Signed)
Please let patient know titration study suboptimal with limited sleep   New CPAP start orders  Will begin CPAP 5-12 cmH2O. W/ nasal pillows   Ov in 2-3 months for follow up

## 2020-11-03 NOTE — Telephone Encounter (Signed)
Called and spoke with patient, advised of recommendations per Rexene Edison NP.  She agreed to use [facility] for her CPAP.  Orders sent to [facility].  She will call us when she receives her CPAP so we can arrange her 2-3 month f/u.

## 2020-11-03 NOTE — Addendum Note (Signed)
Addended by: Vanessa Barbara on: 11/03/2020 03:37 PM   Modules accepted: Orders

## 2020-11-03 NOTE — Telephone Encounter (Signed)
ATC patient, left vm for patient to return call regarding titration study and treatment.

## 2020-11-03 NOTE — Telephone Encounter (Signed)
Called and spoke with patient.  CPAP ordered from [facility].  Patient will call when she receives her CPAP to schedule her 2-3 month f/u.  Patient requesting to f/u with Dr. Mortimer Fries.  Nothing further needed.

## 2020-11-09 ENCOUNTER — Ambulatory Visit (INDEPENDENT_AMBULATORY_CARE_PROVIDER_SITE_OTHER): Payer: Medicare Other | Admitting: Nurse Practitioner

## 2020-11-09 ENCOUNTER — Encounter: Payer: Self-pay | Admitting: Nurse Practitioner

## 2020-11-09 VITALS — BP 134/84 | HR 92 | Temp 97.6°F | Resp 16 | Ht 64.0 in | Wt 238.2 lb

## 2020-11-09 DIAGNOSIS — R300 Dysuria: Secondary | ICD-10-CM | POA: Diagnosis not present

## 2020-11-09 DIAGNOSIS — Z23 Encounter for immunization: Secondary | ICD-10-CM | POA: Diagnosis not present

## 2020-11-09 DIAGNOSIS — J301 Allergic rhinitis due to pollen: Secondary | ICD-10-CM | POA: Diagnosis not present

## 2020-11-09 DIAGNOSIS — E782 Mixed hyperlipidemia: Secondary | ICD-10-CM

## 2020-11-09 DIAGNOSIS — F411 Generalized anxiety disorder: Secondary | ICD-10-CM

## 2020-11-09 DIAGNOSIS — Z853 Personal history of malignant neoplasm of breast: Secondary | ICD-10-CM | POA: Insufficient documentation

## 2020-11-09 DIAGNOSIS — Z0001 Encounter for general adult medical examination with abnormal findings: Secondary | ICD-10-CM | POA: Diagnosis not present

## 2020-11-09 DIAGNOSIS — F5101 Primary insomnia: Secondary | ICD-10-CM

## 2020-11-09 MED ORDER — ZOLPIDEM TARTRATE 10 MG PO TABS: 10.0000 mg | ORAL_TABLET | Freq: Every evening | ORAL | 1 refills | Status: DC | PRN

## 2020-11-09 MED ORDER — ATORVASTATIN CALCIUM 10 MG PO TABS: 10.0000 mg | ORAL_TABLET | Freq: Every day | ORAL | 3 refills | Status: DC

## 2020-11-09 MED ORDER — ALPRAZOLAM 1 MG PO TABS: 1.0000 mg | ORAL_TABLET | Freq: Every evening | ORAL | 1 refills | Status: DC | PRN

## 2020-11-09 NOTE — Progress Notes (Signed)
[facility] [place],  [address]  Internal MEDICINE  Office Visit Note  Patient Name: Janet Austin  [id number]  [id number]   Date of Service: 11/09/2020   Pt is here for routine health maintenance examination   Chief Complaint  Patient presents with  . Medicare Wellness  . Anxiety  . Sleep Apnea  . policy update for    received     The patient is here for health maintenance exam. She states that she is doing well. She sees a pulmonology specialist. Ordered a sleep study. Had to try CPAP. Caused her to have restless legs. Will be trying nasal pillows.  She has weaned herself off pain medications and is not seeing her pain management provider any longer.  Did get Wynetta Emery and [place] COVID 19 vaccine in April. She does still have an axillary lymph nodr enlarged on the right side. This is not tender.  Current Medication: Outpatient Encounter Medications as of 11/09/2020  Medication Sig  . albuterol (VENTOLIN HFA) 108 (90 Base) MCG/ACT inhaler Inhale 2 puffs into the lungs every 6 (six) hours as needed for wheezing or shortness of breath.  . budesonide-formoterol (SYMBICORT) 160-4.5 MCG/ACT inhaler Inhale 2 puffs into the lungs 2 (two) times daily.  . clobetasol ointment (TEMOVATE) 8.91 % Apply 1 application topically daily as needed.  . cyclobenzaprine (FLEXERIL) 5 MG tablet Take 5 mg by mouth 3 (three) times daily as needed for muscle spasms.  Marland Kitchen triamcinolone ointment (KENALOG) 0.5 % Apply 1 application topically 2 (two) times daily.  . valACYclovir (VALTREX) 1000 MG tablet Take 1 tablet (1,000 mg total) by mouth 2 (two) times daily.  . [DISCONTINUED] ALPRAZolam (XANAX) 1 MG tablet Take 1 tablet (1 mg total) by mouth at bedtime as needed for anxiety.  . [DISCONTINUED] atorvastatin (LIPITOR) 10 MG tablet Take 1 tablet (10 mg total) by mouth daily.  . [DISCONTINUED] zolpidem (AMBIEN) 10 MG tablet Take 1 tablet (10 mg total) by mouth at bedtime as  needed for sleep.  Marland Kitchen ALPRAZolam (XANAX) 1 MG tablet Take 1 tablet (1 mg total) by mouth at bedtime as needed for anxiety.  Marland Kitchen atorvastatin (LIPITOR) 10 MG tablet Take 1 tablet (10 mg total) by mouth daily.  Marland Kitchen zolpidem (AMBIEN) 10 MG tablet Take 1 tablet (10 mg total) by mouth at bedtime as needed for sleep.  . [DISCONTINUED] azithromycin (ZITHROMAX) 250 MG tablet Take 500mg  on first day, and take once daily for remaining pills.  . [DISCONTINUED] predniSONE (DELTASONE) 10 MG tablet Use per dose pack   No facility-administered encounter medications on file as of 11/09/2020.    Surgical History: Past Surgical History:  Procedure Laterality Date  . ABDOMINAL HYSTERECTOMY    . BREAST SURGERY     double mastectomy  . BREAST SURGERY     reconstruction  . COLONOSCOPY WITH PROPOFOL N/A 05/29/2016   Procedure: COLONOSCOPY WITH PROPOFOL;  Surgeon: Lollie Sails, MD;  Location: [facility] ENDOSCOPY;  Service: Endoscopy;  Laterality: N/A;  . KNEE ARTHROSCOPY Right   . MASTECTOMY      Medical History: Past Medical History:  Diagnosis Date  . Anxiety   . Arthritis   . Cancer ([place])    left breast  . Headache    migraines  . PONV (postoperative nausea and vomiting)   . Sleep apnea     Family History: Family History  Problem Relation Age of Onset  . Hypertension Father       Review of Systems  Constitutional: Negative for activity change, chills, fatigue and unexpected weight change.  HENT: Negative for congestion, postnasal drip, rhinorrhea, sneezing and sore throat.   Respiratory: Negative for cough, chest tightness, shortness of breath and wheezing.   Cardiovascular: Negative for chest pain and palpitations.  Gastrointestinal: Negative for abdominal pain, constipation, diarrhea, nausea and vomiting.  Endocrine: Negative for cold intolerance, heat intolerance, polydipsia and polyuria.  Genitourinary: Negative for dysuria and frequency.  Musculoskeletal: Positive for back pain and  myalgias. Negative for arthralgias, joint swelling and neck pain.  Skin: Negative for rash.  Allergic/Immunologic: Positive for environmental allergies.  Neurological: Negative for dizziness, tremors, numbness and headaches.  Hematological: Positive for adenopathy. Does not bruise/bleed easily.       Enlargement of right axillary lymph nodes   Psychiatric/Behavioral: Positive for sleep disturbance. Negative for behavioral problems (Depression) and suicidal ideas. The patient is nervous/anxious.      Today's Vitals   11/09/20 0837  BP: 134/84  Pulse: 92  Resp: 16  Temp: 97.6 F (36.4 C)  SpO2: 96%  Weight: 238 lb 3.2 oz (108 kg)  Height: 5\' 4"  (1.626 m)   Body mass index is 40.89 kg/m.    Physical Exam Vitals and nursing note reviewed.  Constitutional:      General: She is not in acute distress.    Appearance: Normal appearance. She is well-developed. She is not diaphoretic.  HENT:     Head: Normocephalic and atraumatic.     Nose: Nose normal.     Mouth/Throat:     Pharynx: No oropharyngeal exudate.  Eyes:     Pupils: Pupils are equal, round, and reactive to light.  Neck:     Thyroid: No thyromegaly.     Vascular: No carotid bruit or JVD.     Trachea: No tracheal deviation.  Cardiovascular:     Rate and Rhythm: Normal rate and regular rhythm.     Pulses: Normal pulses.     Heart sounds: Normal heart sounds. No murmur heard.  No friction rub. No gallop.   Pulmonary:     Effort: Pulmonary effort is normal. No respiratory distress.     Breath sounds: Normal breath sounds. No wheezing or rales.  Chest:     Chest wall: No tenderness.     Comments: Bilateral mastectomy.  Abdominal:     General: Bowel sounds are normal.     Palpations: Abdomen is soft.     Tenderness: There is no abdominal tenderness.  Musculoskeletal:        General: Normal range of motion.     Cervical back: Normal range of motion and neck supple.  Lymphadenopathy:     Cervical: No cervical  adenopathy.  Skin:    General: Skin is warm and dry.  Neurological:     General: No focal deficit present.     Mental Status: She is alert and oriented to person, place, and time.     Cranial Nerves: No cranial nerve deficit.  Psychiatric:        Mood and Affect: Mood normal.        Behavior: Behavior normal.        Thought Content: Thought content normal.        Judgment: Judgment normal.    Depression screen [facility] 2/9 11/09/2020 06/14/2020 02/12/2020 11/05/2019 04/30/2019  Decreased Interest 0 0 0 0 0  Down, Depressed, Hopeless 0 0 0 0 0  PHQ - 2 Score 0 0 0 0 0    Functional Status  Survey: Is the patient deaf or have difficulty hearing?: Yes Does the patient have difficulty seeing, even when wearing glasses/contacts?: No Does the patient have difficulty concentrating, remembering, or making decisions?: No Does the patient have difficulty walking or climbing stairs?: Yes Does the patient have difficulty dressing or bathing?: Yes Does the patient have difficulty doing errands alone such as visiting a doctor's office or shopping?: No  MMSE - [place] Exam 11/09/2020 08/03/2019  Orientation to time 5 5  Orientation to Place 5 5  Registration 3 3  Attention/ Calculation 5 5  Recall 3 3  Language- name 2 objects 2 2  Language- repeat 1 1  Language- follow 3 step command 3 3  Language- read & follow direction 1 1  Write a sentence 1 1  Copy design 1 1  Total score 30 30    Fall Risk  11/09/2020 06/14/2020 02/12/2020 11/05/2019 04/30/2019  Falls in the past year? 1 1 0 1 0  Number falls in past yr: 1 0 - 1 0  Injury with Fall? 0 0 - 0 -  Follow up Falls evaluation completed - - - -    Assessment/Plan: 1. Encounter for general adult medical examination with abnormal findings Annual health maintenance exam today  2. Mixed hyperlipidemia Reviewed labs. Only mild elevation of LDL with remainder of lipid panel normal. conitnue lipitor as prescribed  - atorvastatin  (LIPITOR) 10 MG tablet; Take 1 tablet (10 mg total) by mouth daily.  Dispense: 90 tablet; Refill: 3  3. Seasonal allergic rhinitis due to pollen Continue allergy medications and inhaler use as needed   4. History of breast cancer in female Stable after bilateral mastectomy.   5. Generalized anxiety disorder May take 1/2 to 1 tablet every evening as needed.  - ALPRAZolam (XANAX) 1 MG tablet; Take 1 tablet (1 mg total) by mouth at bedtime as needed for anxiety.  Dispense: 90 tablet; Refill: 1  6. Primary insomnia May conitnue ambien 10mg  at bedtime as needed for insomnia. New prescription sent today.  - zolpidem (AMBIEN) 10 MG tablet; Take 1 tablet (10 mg total) by mouth at bedtime as needed for sleep.  Dispense: 90 tablet; Refill: 1  7. Flu vaccine need Flu vaccine administered today - Flu Vaccine MDCK QUAD PF  8. Dysuria - UA/M w/rflx Culture, Routine  General Counseling: Lizania verbalizes understanding of the findings of todays visit and agrees with plan of treatment. I have discussed any further diagnostic evaluation that may be needed or ordered today. We also reviewed her medications today. she has been encouraged to call the office with any questions or concerns that should arise related to todays visit.    Counseling:  This patient was seen by Leretha Pol FNP Collaboration with Dr Lavera Guise as a part of collaborative care agreement  Orders Placed This Encounter  Procedures  . Flu Vaccine MDCK QUAD PF  . UA/M w/rflx Culture, Routine    Meds ordered this encounter  Medications  . ALPRAZolam (XANAX) 1 MG tablet    Sig: Take 1 tablet (1 mg total) by mouth at bedtime as needed for anxiety.    Dispense:  90 tablet    Refill:  1    This is 90 day prescription    Order Specific Question:   Supervising Provider    Answer:   Lavera Guise [[id number]]  . zolpidem (AMBIEN) 10 MG tablet    Sig: Take 1 tablet (10 mg total) by mouth at  bedtime as needed for sleep.    Dispense:   90 tablet    Refill:  1    This is 90 day prescription    Order Specific Question:   Supervising Provider    Answer:   Lavera Guise [[id number]]  . atorvastatin (LIPITOR) 10 MG tablet    Sig: Take 1 tablet (10 mg total) by mouth daily.    Dispense:  90 tablet    Refill:  3    Order Specific Question:   Supervising Provider    Answer:   Lavera Guise [[id number]]    Total time spent: 47 Minutes  Time spent includes review of chart, medications, test results, and follow up plan with the patient.     Lavera Guise, MD  Internal Medicine

## 2020-11-10 LAB — MICROSCOPIC EXAMINATION
Bacteria, UA: NONE SEEN
Casts: NONE SEEN /lpf
RBC, Urine: NONE SEEN /hpf (ref 0–2)

## 2020-11-10 LAB — UA/M W/RFLX CULTURE, ROUTINE
Bilirubin, UA: NEGATIVE
Glucose, UA: NEGATIVE
Leukocytes,UA: NEGATIVE
Nitrite, UA: NEGATIVE
RBC, UA: NEGATIVE
Specific Gravity, UA: 1.028 (ref 1.005–1.030)
Urobilinogen, Ur: 0.2 mg/dL (ref 0.2–1.0)
pH, UA: 5 (ref 5.0–7.5)

## 2020-11-17 ENCOUNTER — Other Ambulatory Visit
Admission: RE | Admit: 2020-11-17 | Discharge: 2020-11-17 | Disposition: A | Discharge status: Home / Self Care | Payer: Medicare Other | Source: Ambulatory Visit | Attending: Internal Medicine | Admitting: Internal Medicine

## 2020-11-17 ENCOUNTER — Ambulatory Visit: Payer: Medicare Other | Admitting: Internal Medicine

## 2020-11-17 ENCOUNTER — Other Ambulatory Visit: Payer: Self-pay

## 2020-11-17 ENCOUNTER — Encounter: Payer: Self-pay | Admitting: Internal Medicine

## 2020-11-17 VITALS — BP 128/74 | HR 91 | Temp 97.9°F | Ht 64.0 in | Wt 237.0 lb

## 2020-11-17 DIAGNOSIS — G2581 Restless legs syndrome: Secondary | ICD-10-CM

## 2020-11-17 DIAGNOSIS — D539 Nutritional anemia, unspecified: Secondary | ICD-10-CM

## 2020-11-17 LAB — FERRITIN: Ferritin: 78 ng/mL (ref 11–307)

## 2020-11-17 NOTE — Progress Notes (Signed)
Name: Janet Austin MRN: [id number] DOB: [birth year]     CONSULTATION DATE: 11/17/2020  REFERRING MD : Humphrey Rolls  CHIEF COMPLAINT: SOB  SYNOPSIS 59 year old female never smoker followed for mild intermittent asthma, pulmonary nodules (serial follow up , resolved on CT chest 12/2016)  Medical history significant for breast cancer 2010 s/p bilateral mastectomy ,and chemo .    TEST/EVENTS :  Pulmonary function testing done July 2018 showed no airflow obstruction with FEV1 at 81%, ratio 86, FVC 77% no significant bronchodilator response, DLCO 52%, mid flow obstruction and reversibility noted CXR 10/2016 clear lungs    08/24/2020  Follow up : Asthma Patient returns for a follow-up visit.  She was last seen March 2019. Patient says overall breathing has not been doing as well with increased cough , throat clearing, dry cough. Wheezing , shortness of breath . Has been going on since last visit  but getting more frequent in last year.  She is currently using ventolin 3-4 times a day . Has nasal stuffiness and drainage.  Had childhood asthma. Never been on a maintenance inhaler.  No GERD.   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    Last visit patient had complained of some daytime sleepiness and snoring with [place] at  14.  She was recommended for a sleep study however patient says she was unable to go due to family issues. Husband is sick and son ran away from home.  Symptoms are worse. Complains of increased daytime sleepiness, snoring , wakes self up gasping for air several times a night . Mouth dryness. Wakes up tired. Takes Ambien for sleep, can not go to sleep without it .  Rare caffeine. No signs of cataplexy or sleep paralysis .  Sleeps on average 4hr each night . Up with husband a lot during night .   Never smoker, has dog but for long time.   HPI Patient's asthma seems to be stable with Symbicort No active signs of infection No exacerbation at this time No  evidence of heart failure at this time No evidence or signs of infection at this time No respiratory distress No fevers, chills, nausea, vomiting, diarrhea No evidence of lower extremity edema No evidence hemoptysis  Patient recently diagnosed with obstructive sleep apnea Sleep study documents and reports reviewed in detail with the patient She snored 415 times AHI was 20 She desatted 134 times Lowest O2 sat was 75%    PAST MEDICAL HISTORY :   has a past medical history of Anxiety, Arthritis, Cancer ([place]), Headache, PONV (postoperative nausea and vomiting), and Sleep apnea.  has a past surgical history that includes Abdominal hysterectomy; Breast surgery; Breast surgery; Mastectomy; Knee arthroscopy (Right); and Colonoscopy with propofol (N/A, 05/29/2016). Prior to Admission medications   Medication Sig Start Date End Date Taking? Authorizing Provider  albuterol (VENTOLIN HFA) 108 (90 Base) MCG/ACT inhaler Inhale 2 puffs into the lungs every 6 (six) hours as needed for wheezing or shortness of breath. 02/12/20  Yes Boscia, Heather E, NP  ALPRAZolam (XANAX) 1 MG tablet Take 1 tablet (1 mg total) by mouth at bedtime as needed for anxiety. 11/09/20  Yes Boscia, Greer Ee, NP  atorvastatin (LIPITOR) 10 MG tablet Take 1 tablet (10 mg total) by mouth daily. 11/09/20  Yes Boscia, Greer Ee, NP  budesonide-formoterol (SYMBICORT) 160-4.5 MCG/ACT inhaler Inhale 2 puffs into the lungs 2 (two) times daily. 08/24/20  Yes Parrett, Tammy S, NP  clobetasol ointment (TEMOVATE) 7.89 % Apply 1 application topically  daily as needed. 11/03/18  Yes Scarboro, Audie Clear, NP  cyclobenzaprine (FLEXERIL) 5 MG tablet Take 5 mg by mouth 3 (three) times daily as needed for muscle spasms.   Yes [provider]  triamcinolone ointment (KENALOG) 0.5 % Apply 1 application topically 2 (two) times daily. 11/03/18  Yes Scarboro, Audie Clear, NP  valACYclovir (VALTREX) 1000 MG tablet Take 1 tablet (1,000 mg total) by mouth 2  (two) times daily. 02/12/20  Yes Boscia, Greer Ee, NP  zolpidem (AMBIEN) 10 MG tablet Take 1 tablet (10 mg total) by mouth at bedtime as needed for sleep. 11/09/20  Yes Ronnell Freshwater, NP   Allergies  Allergen Reactions  . Asa [Aspirin]   . Gabapentin Other (See Comments)    Eye twitch  . Lyrica [Pregabalin] Other (See Comments)    Altered mental status  . Tape Rash    Patient states she had blisters and it was very itchy, ok to use paper tape and tegaderm     FAMILY HISTORY:  family history includes Hypertension in her father. SOCIAL HISTORY:  reports that she has never smoked. She has never used smokeless tobacco. She reports that she does not drink alcohol and does not use drugs.    Review of Systems:  Gen:  Denies  fever, sweats, chills weigh loss  HEENT: Denies blurred vision, double vision, ear pain, eye pain, hearing loss, nose bleeds, sore throat Cardiac:  No dizziness, chest pain or heaviness, chest tightness,edema, No JVD Resp:   No cough, -sputum production, -shortness of breath,-wheezing, -hemoptysis,  Gi: Denies swallowing difficulty, stomach pain, nausea or vomiting, diarrhea, constipation, bowel incontinence Gu:  Denies bladder incontinence, burning urine Ext:   Denies Joint pain, stiffness or swelling Skin: Denies  skin rash, easy bruising or bleeding or hives Endoc:  Denies polyuria, polydipsia , polyphagia or weight change Psych:   Denies depression, insomnia or hallucinations  Other:  All other systems negative     BP 128/74 (BP Location: Left Arm, Cuff Size: Normal)   Pulse 91   Temp 97.9 F (36.6 C) (Temporal)   Ht 5\' 4"  (1.626 m)   Wt 237 lb (107.5 kg)   SpO2 96%   BMI 40.68 kg/m        Physical Examination:   GENERAL:NAD, no fevers, chills, no weakness no fatigue HEAD: Normocephalic, atraumatic.  EYES: PERLA, EOMI No scleral icterus.  MOUTH: Moist mucosal membrane.  EAR, NOSE, THROAT: Clear without exudates. No external lesions.   NECK: Supple.  PULMONARY: CTA B/L no wheezing, rhonchi, crackles CARDIOVASCULAR: S1 and S2. Regular rate and rhythm. No murmurs GASTROINTESTINAL: Soft, nontender, nondistended. Positive bowel sounds.  MUSCULOSKELETAL: No swelling, clubbing, or edema.  NEUROLOGIC: No gross focal neurological deficits. 5/5 strength all extremities SKIN: No ulceration, lesions, rashes, or cyanosis.  PSYCHIATRIC: Insight, judgment intact. -depression -anxiety ALL OTHER ROS ARE NEGATIVE   MEDICATIONS: I have reviewed all medications and confirmed regimen as documented    CULTURE RESULTS   Recent Results (from the past 240 hour(s))  Microscopic Examination     Status: None   Collection Time: 11/09/20 12:03 PM   Urine  Result Value Ref Range Status   WBC, UA 0-5 0 - 5 /hpf Final   RBC None seen 0 - 2 /hpf Final   Epithelial Cells (non renal) 0-10 0 - 10 /hpf Final   Casts None seen None seen /lpf Final   Bacteria, UA None seen None seen/Few Final  ASSESSMENT AND PLAN SYNOPSIS  Mild intermittent asthma without complication trial of ICS/LABA combo as her maintenance inhaler.   Control for triggers and avoid triggers, albuterol as needed Commend weight loss No indication for prednisone or antibiotics at this time  Recent diagnosis of obstructive sleep apnea AHI of 20 Dx of Moderate OSA I recommend starting auto CPAP 5 to 12 cm of water pressure At this time it seems that CPAP and mask supplies are in shortage Sleep study reviewed in detail with patient Patient had a very negative experience in the sleep study titration study  Patient may have underlying restless leg syndrome We will check ferritin levels If less than 50 will treat with iron therapy to a goal of greater than 75    COVID-19 EDUCATION: The signs and symptoms of COVID-19 were discussed with the patient and how to seek care for testing.  The importance of social distancing was discussed today. Hand Washing  Techniques and avoid touching face was advised.     MEDICATION ADJUSTMENTS/LABS AND TESTS ORDERED: WILL PRESCRIVBE AUTO CPAP 5-15 when you get supplies Check FERRITIN  Levels (will treat with IRON  Therapy if less than 50) Continue Inhalers as prescribed   CURRENT MEDICATIONS REVIEWED AT LENGTH WITH PATIENT TODAY   Patient satisfied with Plan of action and management. All questions answered  Follow up in 3 months  TOTAL TIME SPENT 37 minutes   Corrin Parker, M.D.  [facility]  Medical Director [facility] Director [facility] Cardio-Pulmonary Department

## 2020-11-17 NOTE — Patient Instructions (Addendum)
WILL PRESCRIVBE AUTO CPAP 5-15 when you get supplies Check FERRITIN  Levels (will treat with IRON  Therapy if less than 50) Continue Inhalers as prescribed

## 2020-12-21 DIAGNOSIS — M3500 Sicca syndrome, unspecified: Secondary | ICD-10-CM | POA: Diagnosis not present

## 2021-01-03 ENCOUNTER — Encounter: Payer: Self-pay | Admitting: Hospice and Palliative Medicine

## 2021-01-03 ENCOUNTER — Ambulatory Visit: Payer: Medicare Other | Admitting: Hospice and Palliative Medicine

## 2021-01-03 ENCOUNTER — Encounter: Payer: Self-pay | Admitting: Nurse Practitioner

## 2021-01-03 ENCOUNTER — Other Ambulatory Visit: Payer: Self-pay

## 2021-01-03 VITALS — Temp 99.0°F | Ht 64.0 in | Wt 234.0 lb

## 2021-01-03 DIAGNOSIS — J4520 Mild intermittent asthma, uncomplicated: Secondary | ICD-10-CM

## 2021-01-03 DIAGNOSIS — U071 COVID-19: Secondary | ICD-10-CM

## 2021-01-03 MED ORDER — AZITHROMYCIN 250 MG PO TABS: ORAL_TABLET | ORAL | 0 refills | Status: DC

## 2021-01-03 NOTE — Progress Notes (Unsigned)
[facility] [place], [place] [address]  Internal MEDICINE  Telephone Visit  Patient Name: Janet Austin  [id number]  [id number]  Date of Service: 01/05/2021  I connected with the patient at 1447 by telephone and verified the patients identity using two identifiers.   I discussed the limitations, risks, security and privacy concerns of performing an evaluation and management service by telephone and the availability of in person appointments. I also discussed with the patient that there may be a patient responsible charge related to the service.  The patient expressed understanding and agrees to proceed.    Chief Complaint  Patient presents with  . Telephone Assessment    [phone]   . Telephone Screen  . Sinusitis  . Sore Throat  . Fever  . Headache    HPI Patient is being seen virtually today for acute sick visit Tested positive for COVID on Monday with home testing She received Wynetta Emery and [employer] vaccine and [place] booster earlier this month She is concerned due to her husband actively undergoing treatment for cancer--he has not yet been exposed to her and she is quarantining appropriately  C/o nasal and chest congestion, fever and headaches Pulmonary history of OSA and asthma Denies shortness of breath or wheezing  She is interested in monoclonal antibody infusion--requesting referral  Current Medication: Outpatient Encounter Medications as of 01/03/2021  Medication Sig  . albuterol (VENTOLIN HFA) 108 (90 Base) MCG/ACT inhaler Inhale 2 puffs into the lungs every 6 (six) hours as needed for wheezing or shortness of breath.  . ALPRAZolam (XANAX) 1 MG tablet Take 1 tablet (1 mg total) by mouth at bedtime as needed for anxiety.  Marland Kitchen atorvastatin (LIPITOR) 10 MG tablet Take 1 tablet (10 mg total) by mouth daily.  Marland Kitchen azithromycin (ZITHROMAX Z-PAK) 250 MG tablet Take two 250 mg tablets on day one followed by one 250 mg tablet each day for four days.  .  budesonide-formoterol (SYMBICORT) 160-4.5 MCG/ACT inhaler Inhale 2 puffs into the lungs 2 (two) times daily.  . clobetasol ointment (TEMOVATE) 8.92 % Apply 1 application topically daily as needed.  . cyclobenzaprine (FLEXERIL) 5 MG tablet Take 5 mg by mouth 3 (three) times daily as needed for muscle spasms.  Marland Kitchen triamcinolone ointment (KENALOG) 0.5 % Apply 1 application topically 2 (two) times daily.  . valACYclovir (VALTREX) 1000 MG tablet Take 1 tablet (1,000 mg total) by mouth 2 (two) times daily.  Marland Kitchen zolpidem (AMBIEN) 10 MG tablet Take 1 tablet (10 mg total) by mouth at bedtime as needed for sleep.   No facility-administered encounter medications on file as of 01/03/2021.    Surgical History: Past Surgical History:  Procedure Laterality Date  . ABDOMINAL HYSTERECTOMY    . BREAST SURGERY     double mastectomy  . BREAST SURGERY     reconstruction  . COLONOSCOPY WITH PROPOFOL N/A 05/29/2016   Procedure: COLONOSCOPY WITH PROPOFOL;  Surgeon: Lollie Sails, MD;  Location: [facility] ENDOSCOPY;  Service: Endoscopy;  Laterality: N/A;  . KNEE ARTHROSCOPY Right   . MASTECTOMY      Medical History: Past Medical History:  Diagnosis Date  . Anxiety   . Arthritis   . Cancer ([place])    left breast  . Headache    migraines  . PONV (postoperative nausea and vomiting)   . Sleep apnea     Family History: Family History  Problem Relation Age of Onset  . Hypertension Father     Social History  Socioeconomic History  . Marital status: Married    Spouse name: Not on file  . Number of children: Not on file  . Years of education: Not on file  . Highest education level: Not on file  Occupational History  . Not on file  Tobacco Use  . Smoking status: Never Smoker  . Smokeless tobacco: Never Used  Vaping Use  . Vaping Use: Never used  Substance and Sexual Activity  . Alcohol use: No  . Drug use: No  . Sexual activity: Not on file  Other Topics Concern  . Not on file  Social History  Narrative  . Not on file   Social Determinants of Health   Financial Resource Strain: Not on file  Food Insecurity: Not on file  Transportation Needs: Not on file  Physical Activity: Not on file  Stress: Not on file  Social Connections: Not on file  Intimate Partner Violence: Not on file    Review of Systems  Constitutional: Negative for chills, diaphoresis and fatigue.  HENT: Positive for congestion and sore throat. Negative for ear pain, postnasal drip and sinus pressure.   Eyes: Negative for photophobia, discharge, redness, itching and visual disturbance.  Respiratory: Positive for cough. Negative for shortness of breath and wheezing.   Cardiovascular: Negative for chest pain, palpitations and leg swelling.  Gastrointestinal: Negative for abdominal pain, constipation, diarrhea, nausea and vomiting.  Genitourinary: Negative for dysuria and flank pain.  Musculoskeletal: Negative for arthralgias, back pain, gait problem and neck pain.  Skin: Negative for color change.  Allergic/Immunologic: Negative for environmental allergies and food allergies.  Neurological: Positive for headaches. Negative for dizziness.  Hematological: Does not bruise/bleed easily.  Psychiatric/Behavioral: Negative for agitation, behavioral problems (depression) and hallucinations.    Vital Signs: Temp 99 F (37.2 C)   Ht 5\' 4"  (1.626 m)   Wt 234 lb (106.1 kg)   BMI 40.17 kg/m    Observation/Objective: Alert and oriented, answers questions appropriately. No acute distress noted, congestion in voice.  Assessment/Plan: 1. COVID-19 virus detected Treat with ZPAK due to underlying pulmonary disease Contact number provided for monoclonal antibody infusion Increase fluid intake, rest, encouraged to take Vitamin C, D as well as Zinc Treat fevers with acetaminophen/ibuprofen - azithromycin (ZITHROMAX Z-PAK) 250 MG tablet; Take two 250 mg tablets on day one followed by one 250 mg tablet each day for four  days.  Dispense: 6 each; Refill: 0  2. Mild intermittent asthma without complication Breathing remains stable at this time, continue with current inhaler therapy Advised to contact office if symptoms worsen--consider prednisone taper  General Counseling: cheree fowles understanding of the findings of today's phone visit and agrees with plan of treatment. I have discussed any further diagnostic evaluation that may be needed or ordered today. We also reviewed her medications today. she has been encouraged to call the office with any questions or concerns that should arise related to todays visit.   Meds ordered this encounter  Medications  . azithromycin (ZITHROMAX Z-PAK) 250 MG tablet    Sig: Take two 250 mg tablets on day one followed by one 250 mg tablet each day for four days.    Dispense:  6 each    Refill:  0     Time spent: 30 Minutes Time spent includes review of chart, medications, test results and follow-up plan with the patient.  Tanna Furry Tanieka Pownall AGNP-C Internal medicine

## 2021-01-03 NOTE — Telephone Encounter (Signed)
See this, might need virtual visit

## 2021-01-04 ENCOUNTER — Encounter: Payer: Self-pay | Admitting: Hospice and Palliative Medicine

## 2021-01-04 ENCOUNTER — Other Ambulatory Visit: Payer: Self-pay | Admitting: Hospice and Palliative Medicine

## 2021-01-04 ENCOUNTER — Telehealth: Payer: Self-pay

## 2021-01-04 ENCOUNTER — Telehealth: Payer: Self-pay | Admitting: Internal Medicine

## 2021-01-04 MED ORDER — PREDNISONE 10 MG PO TABS: ORAL_TABLET | ORAL | 0 refills | Status: DC

## 2021-01-04 MED ORDER — HYDROCOD POLST-CPM POLST ER 10-8 MG/5ML PO SUER: 5.0000 mL | Freq: Every evening | ORAL | 0 refills | Status: DC | PRN

## 2021-01-04 NOTE — Telephone Encounter (Signed)
Please schedule at 9

## 2021-01-04 NOTE — Telephone Encounter (Signed)
Spoke to patient, who stated that she tested positive for covid yesterday on home test. Patient stated that she contacted health department and was advised that PCR test was not needed.  Patient stated that her symptoms started on 01/03/2021. She reports of chest/head congestion, sore throat, wheezing, low grade off 99.5 and headache Sob is baseline.  PCP prescribed zpak and prednisone on 01/04/2021. Referral has been placed to [facility] per covid call protocol. Patient is aware that she will be contacted by [facility] if she qualifies.  Per Dr. Mortimer Fries verbally would not recommend nebulizer unless patient lives alone. Patient doe snot live alone.   Nothing further needed at this time.

## 2021-01-04 NOTE — Telephone Encounter (Signed)
Spoke with pt that she can call [facility] for monoclonal antibody and also take all med we pres and if symptoms worse she can go to ED

## 2021-01-05 ENCOUNTER — Telehealth: Payer: Self-pay | Admitting: Family

## 2021-01-05 ENCOUNTER — Encounter: Payer: Self-pay | Admitting: Hospice and Palliative Medicine

## 2021-01-05 ENCOUNTER — Telehealth: Payer: Self-pay

## 2021-01-05 NOTE — Telephone Encounter (Signed)
Called to discuss with patient about COVID-19 symptoms and the use of one of the available treatments for those with mild to moderate Covid symptoms and at a high risk of hospitalization.  Pt appears to qualify for outpatient treatment due to co-morbid conditions and/or a member of an at-risk group in accordance with the FDA Emergency Use Authorization.    Symptom onset: 01/02/21 Vaccinated: Yes Booster? Moderna Immunocompromised? No Qualifiers: Asthma, BMI 40, sleep apnea, history of breast cancer  I was unable to get in contact with Ms. Heinzel via phone and left a voicemail with call back number and sent a MyChart message.   Terri Piedra, NP 01/05/2021 3:01 PM

## 2021-01-05 NOTE — Telephone Encounter (Signed)
Called to discuss with patient about COVID-19 symptoms and the use of one of the available treatments for those with mild to moderate Covid symptoms and at a high risk of hospitalization.  Pt appears to qualify for outpatient treatment due to co-morbid conditions and/or a member of an at-risk group in accordance with the FDA Emergency Use Authorization.    Symptom onset: 01/02/21 Vaccinated: Yes - JJ Booster? Yes - Moderna Immunocompromised? No Qualifiers: Asthma  Unable to reach pt - Reached pt.  Janet Austin  Husband has cancer and pt. Concerned about him getting sick. States she has "reacted to Tamiflu."

## 2021-01-06 ENCOUNTER — Ambulatory Visit: Payer: Medicare Other | Admitting: Hospice and Palliative Medicine

## 2021-01-12 ENCOUNTER — Encounter: Payer: Self-pay | Admitting: Hospice and Palliative Medicine

## 2021-01-16 DIAGNOSIS — M5432 Sciatica, left side: Secondary | ICD-10-CM | POA: Diagnosis not present

## 2021-01-16 DIAGNOSIS — M9903 Segmental and somatic dysfunction of lumbar region: Secondary | ICD-10-CM | POA: Diagnosis not present

## 2021-01-16 DIAGNOSIS — M9905 Segmental and somatic dysfunction of pelvic region: Secondary | ICD-10-CM | POA: Diagnosis not present

## 2021-01-16 DIAGNOSIS — M9904 Segmental and somatic dysfunction of sacral region: Secondary | ICD-10-CM | POA: Diagnosis not present

## 2021-01-18 DIAGNOSIS — M9903 Segmental and somatic dysfunction of lumbar region: Secondary | ICD-10-CM | POA: Diagnosis not present

## 2021-01-18 DIAGNOSIS — M9905 Segmental and somatic dysfunction of pelvic region: Secondary | ICD-10-CM | POA: Diagnosis not present

## 2021-01-18 DIAGNOSIS — M5432 Sciatica, left side: Secondary | ICD-10-CM | POA: Diagnosis not present

## 2021-01-18 DIAGNOSIS — M9904 Segmental and somatic dysfunction of sacral region: Secondary | ICD-10-CM | POA: Diagnosis not present

## 2021-01-20 DIAGNOSIS — M9903 Segmental and somatic dysfunction of lumbar region: Secondary | ICD-10-CM | POA: Diagnosis not present

## 2021-01-20 DIAGNOSIS — M5432 Sciatica, left side: Secondary | ICD-10-CM | POA: Diagnosis not present

## 2021-01-20 DIAGNOSIS — M9904 Segmental and somatic dysfunction of sacral region: Secondary | ICD-10-CM | POA: Diagnosis not present

## 2021-01-20 DIAGNOSIS — M9905 Segmental and somatic dysfunction of pelvic region: Secondary | ICD-10-CM | POA: Diagnosis not present

## 2021-01-25 DIAGNOSIS — M9903 Segmental and somatic dysfunction of lumbar region: Secondary | ICD-10-CM | POA: Diagnosis not present

## 2021-01-25 DIAGNOSIS — M5432 Sciatica, left side: Secondary | ICD-10-CM | POA: Diagnosis not present

## 2021-01-25 DIAGNOSIS — M9905 Segmental and somatic dysfunction of pelvic region: Secondary | ICD-10-CM | POA: Diagnosis not present

## 2021-01-25 DIAGNOSIS — M9904 Segmental and somatic dysfunction of sacral region: Secondary | ICD-10-CM | POA: Diagnosis not present

## 2021-02-01 ENCOUNTER — Other Ambulatory Visit: Payer: Self-pay

## 2021-02-01 ENCOUNTER — Encounter: Payer: Self-pay | Admitting: Hospice and Palliative Medicine

## 2021-02-01 ENCOUNTER — Ambulatory Visit: Payer: Medicare Other | Admitting: Hospice and Palliative Medicine

## 2021-02-01 VITALS — HR 82 | Temp 99.0°F | Resp 16 | Ht 64.0 in | Wt 235.0 lb

## 2021-02-01 DIAGNOSIS — H9201 Otalgia, right ear: Secondary | ICD-10-CM | POA: Diagnosis not present

## 2021-02-01 DIAGNOSIS — J0140 Acute pansinusitis, unspecified: Secondary | ICD-10-CM

## 2021-02-01 MED ORDER — AMOXICILLIN-POT CLAVULANATE 875-125 MG PO TABS: 1.0000 | ORAL_TABLET | Freq: Two times a day (BID) | ORAL | 0 refills | Status: DC

## 2021-02-01 MED ORDER — CIPROFLOXACIN HCL 0.3 % OP SOLN: OPHTHALMIC | 0 refills | Status: DC

## 2021-02-01 MED ORDER — PREDNISONE 10 MG PO TABS: ORAL_TABLET | ORAL | 0 refills | Status: DC

## 2021-02-01 NOTE — Progress Notes (Signed)
[facility] [place], [place] [address]  Internal MEDICINE  Telephone Visit  Patient Name: Janet Austin  [id number]  [id number]  Date of Service: 02/04/2021  I connected with the patient at [phone] by telephone and verified the patients identity using two identifiers.   I discussed the limitations, risks, security and privacy concerns of performing an evaluation and management service by telephone and the availability of in person appointments. I also discussed with the patient that there may be a patient responsible charge related to the service.  The patient expressed understanding and agrees to proceed.    Chief Complaint  Patient presents with  . Acute Visit    Cough with covid, right ear infection getting worse, started over a month ago, tested positive with home test 01-02-21, getting head congestion now   . Telephone Assessment    Either phone or video, please call pt back if she does not answer the first time   . Telephone Screen    [phone]     HPI Patient being seen virtually for acute sick visit Initially tested positive for COVID last month in January--symptoms did improve but are starting to return She has now started to develop right ear pain and head congestion again She frequently gets ear infections and requires second course of antibiotics to clear infections  Current Medication: Outpatient Encounter Medications as of 02/01/2021  Medication Sig  . amoxicillin-clavulanate (AUGMENTIN) 875-125 MG tablet Take 1 tablet by mouth 2 (two) times daily.  . ciprofloxacin (CILOXAN) 0.3 % ophthalmic solution Insert 4 drops into right ear twice daily.  . predniSONE (DELTASONE) 10 MG tablet Take 1 tablet three times a day with a meal for three for three days, take 1 tablet by twice daily with a meal for 3 days, take 1 tablet once daily with a meal for 3 days  . albuterol (VENTOLIN HFA) 108 (90 Base) MCG/ACT inhaler Inhale 2 puffs into the lungs every 6  (six) hours as needed for wheezing or shortness of breath.  . ALPRAZolam (XANAX) 1 MG tablet Take 1 tablet (1 mg total) by mouth at bedtime as needed for anxiety.  Marland Kitchen atorvastatin (LIPITOR) 10 MG tablet Take 1 tablet (10 mg total) by mouth daily.  Marland Kitchen azithromycin (ZITHROMAX Z-PAK) 250 MG tablet Take two 250 mg tablets on day one followed by one 250 mg tablet each day for four days.  . budesonide-formoterol (SYMBICORT) 160-4.5 MCG/ACT inhaler Inhale 2 puffs into the lungs 2 (two) times daily.  . chlorpheniramine-HYDROcodone (TUSSIONEX PENNKINETIC ER) 10-8 MG/5ML SUER Take 5 mLs by mouth at bedtime as needed for cough.  . clobetasol ointment (TEMOVATE) 5.10 % Apply 1 application topically daily as needed.  . cyclobenzaprine (FLEXERIL) 5 MG tablet Take 5 mg by mouth 3 (three) times daily as needed for muscle spasms.  Marland Kitchen triamcinolone ointment (KENALOG) 0.5 % Apply 1 application topically 2 (two) times daily.  . valACYclovir (VALTREX) 1000 MG tablet Take 1 tablet (1,000 mg total) by mouth 2 (two) times daily.  Marland Kitchen zolpidem (AMBIEN) 10 MG tablet Take 1 tablet (10 mg total) by mouth at bedtime as needed for sleep.  . [DISCONTINUED] predniSONE (DELTASONE) 10 MG tablet Take 1 tablet three times a day with a meal for three for three days, take 1 tablet by twice daily with a meal for 3 days, take 1 tablet once daily with a meal for 3 days   No facility-administered encounter medications on file as of 02/01/2021.  Surgical History: Past Surgical History:  Procedure Laterality Date  . ABDOMINAL HYSTERECTOMY    . BREAST SURGERY     double mastectomy  . BREAST SURGERY     reconstruction  . COLONOSCOPY WITH PROPOFOL N/A 05/29/2016   Procedure: COLONOSCOPY WITH PROPOFOL;  Surgeon: Lollie Sails, MD;  Location: [facility] ENDOSCOPY;  Service: Endoscopy;  Laterality: N/A;  . KNEE ARTHROSCOPY Right   . MASTECTOMY      Medical History: Past Medical History:  Diagnosis Date  . Anxiety   . Arthritis   . Cancer  ([place])    left breast  . Headache    migraines  . PONV (postoperative nausea and vomiting)   . Sleep apnea     Family History: Family History  Problem Relation Age of Onset  . Hypertension Father     Social History   Socioeconomic History  . Marital status: Married    Spouse name: Not on file  . Number of children: Not on file  . Years of education: Not on file  . Highest education level: Not on file  Occupational History  . Not on file  Tobacco Use  . Smoking status: Never Smoker  . Smokeless tobacco: Never Used  Vaping Use  . Vaping Use: Never used  Substance and Sexual Activity  . Alcohol use: No  . Drug use: No  . Sexual activity: Not on file  Other Topics Concern  . Not on file  Social History Narrative  . Not on file   Social Determinants of Health   Financial Resource Strain: Not on file  Food Insecurity: Not on file  Transportation Needs: Not on file  Physical Activity: Not on file  Stress: Not on file  Social Connections: Not on file  Intimate Partner Violence: Not on file      Review of Systems  Constitutional: Negative for chills, diaphoresis and fatigue.  HENT: Positive for congestion, ear pain, sinus pressure and sinus pain. Negative for postnasal drip.   Eyes: Negative for photophobia, discharge, redness, itching and visual disturbance.  Respiratory: Negative for cough, shortness of breath and wheezing.   Cardiovascular: Negative for chest pain, palpitations and leg swelling.  Gastrointestinal: Negative for abdominal pain, constipation, diarrhea, nausea and vomiting.  Genitourinary: Negative for dysuria and flank pain.  Musculoskeletal: Negative for arthralgias, back pain, gait problem and neck pain.  Skin: Negative for color change.  Allergic/Immunologic: Negative for environmental allergies and food allergies.  Neurological: Negative for dizziness and headaches.  Hematological: Does not bruise/bleed easily.  Psychiatric/Behavioral:  Negative for agitation, behavioral problems (depression) and hallucinations.    Vital Signs: Pulse 82   Temp 99 F (37.2 C)   Resp 16   Ht 5\' 4"  (1.626 m)   Wt 235 lb (106.6 kg)   SpO2 98%   BMI 40.34 kg/m    Observation/Objective: Alert and oriented, answers questions appropriately. No acute/respiratory distress noted.   Assessment/Plan: 1. Acute non-recurrent pansinusitis Treat with Augmentin and short course of prednisone Advised to contact office if symptoms worsen or have not improved after finished courses of antibiotic therapy - amoxicillin-clavulanate (AUGMENTIN) 875-125 MG tablet; Take 1 tablet by mouth 2 (two) times daily.  Dispense: 20 tablet; Refill: 0 - predniSONE (DELTASONE) 10 MG tablet; Take 1 tablet three times a day with a meal for three for three days, take 1 tablet by twice daily with a meal for 3 days, take 1 tablet once daily with a meal for 3 days  Dispense: 18 tablet;  Refill: 0  2. Right ear pain Apply ear drops to right ear - ciprofloxacin (CILOXAN) 0.3 % ophthalmic solution; Insert 4 drops into right ear twice daily.  Dispense: 5 mL; Refill: 0  General Counseling: Vincenza verbalizes understanding of the findings of today's phone visit and agrees with plan of treatment. I have discussed any further diagnostic evaluation that may be needed or ordered today. We also reviewed her medications today. she has been encouraged to call the office with any questions or concerns that should arise related to todays visit.   Meds ordered this encounter  Medications  . amoxicillin-clavulanate (AUGMENTIN) 875-125 MG tablet    Sig: Take 1 tablet by mouth 2 (two) times daily.    Dispense:  20 tablet    Refill:  0  . predniSONE (DELTASONE) 10 MG tablet    Sig: Take 1 tablet three times a day with a meal for three for three days, take 1 tablet by twice daily with a meal for 3 days, take 1 tablet once daily with a meal for 3 days    Dispense:  18 tablet    Refill:  0  .  ciprofloxacin (CILOXAN) 0.3 % ophthalmic solution    Sig: Insert 4 drops into right ear twice daily.    Dispense:  5 mL    Refill:  0     Time spent: 25 Minutes Time spent includes review of chart, medications, test results and follow-up plan with the patient.  Tanna Furry Shameria Trimarco AGNP-C Internal medicine

## 2021-02-04 ENCOUNTER — Encounter: Payer: Self-pay | Admitting: Hospice and Palliative Medicine

## 2021-02-09 ENCOUNTER — Ambulatory Visit: Payer: Medicare Other | Admitting: Hospice and Palliative Medicine

## 2021-02-14 ENCOUNTER — Other Ambulatory Visit: Payer: Self-pay

## 2021-02-14 ENCOUNTER — Ambulatory Visit (INDEPENDENT_AMBULATORY_CARE_PROVIDER_SITE_OTHER): Payer: Medicare Other | Admitting: Nurse Practitioner

## 2021-02-14 ENCOUNTER — Encounter: Payer: Self-pay | Admitting: Nurse Practitioner

## 2021-02-14 VITALS — BP 129/83 | HR 83 | Temp 97.1°F | Ht 64.0 in | Wt 237.1 lb

## 2021-02-14 DIAGNOSIS — Z7689 Persons encountering health services in other specified circumstances: Secondary | ICD-10-CM | POA: Insufficient documentation

## 2021-02-14 DIAGNOSIS — L309 Dermatitis, unspecified: Secondary | ICD-10-CM | POA: Insufficient documentation

## 2021-02-14 DIAGNOSIS — E782 Mixed hyperlipidemia: Secondary | ICD-10-CM

## 2021-02-14 DIAGNOSIS — Z0000 Encounter for general adult medical examination without abnormal findings: Secondary | ICD-10-CM | POA: Insufficient documentation

## 2021-02-14 DIAGNOSIS — F5101 Primary insomnia: Secondary | ICD-10-CM

## 2021-02-14 DIAGNOSIS — F411 Generalized anxiety disorder: Secondary | ICD-10-CM

## 2021-02-14 MED ORDER — ATORVASTATIN CALCIUM 10 MG PO TABS: 10.0000 mg | ORAL_TABLET | Freq: Every day | ORAL | 3 refills | Status: DC

## 2021-02-14 MED ORDER — TRIAMCINOLONE ACETONIDE 0.5 % EX OINT
1.0000 | TOPICAL_OINTMENT | Freq: Two times a day (BID) | CUTANEOUS | 3 refills | Status: DC
Start: 2021-02-14 — End: 2023-10-08

## 2021-02-14 MED ORDER — ZOLPIDEM TARTRATE 10 MG PO TABS: 10.0000 mg | ORAL_TABLET | Freq: Every evening | ORAL | 1 refills | Status: DC | PRN

## 2021-02-14 MED ORDER — ALPRAZOLAM 1 MG PO TABS: 1.0000 mg | ORAL_TABLET | Freq: Every evening | ORAL | 1 refills | Status: DC | PRN

## 2021-02-14 NOTE — Progress Notes (Signed)
New Patient Office Visit  Subjective:  Patient ID: Janet Austin, female    DOB: 17-Nov-1961  Age: 60 y.o. MRN: [id number]  CC:  Chief Complaint  Patient presents with  . New Patient (Initial Visit)    HPI Janet Austin presents to establish new primary care provider. She has history of breast cancer. She had bilateral mastectomy in 2010. She did have breast augmentaion surgery done after that and has, since, had breast implants removed. She also has history of hyperlipidemia. Most recent fasting labs were done 07/2020. Her total cholesterol was 181, triglycerides 139, HDL 50, and LDL was 106. She is currently taking lipitor 10mg  daily without negative side effects and needs to have refills of this today.  She has been suffering from generalized anxiety since having breast cancer in 2010. She does take alprazolam 1mg  in the evening when needed to help reduce anxiety. She also takes ambine 10mg  at bedtime as needed for insomnia.  Her PDMP profile was reviewed today 02/14/2021.  She needs refills for both of these.medications today. She did score 11 on PHQ 9 today. She describes her anxiety as being situational in nature. She has a young adult son causing her family stress. Her husband is also currently being treated for lymphoma which increases her anxiety. She feels like her symptoms are weill managed and she does not feel like she needs to take something on everyday basis.  She states that she has noted increased eczema on her hands. Generally happens in the winter time and with washing her hands frequently. In the past, she has been prescribed cream to use when needed which helps to reduce the symptoms. She states that in the summer, this is not generally a problem.   Past Medical History:  Diagnosis Date  . Anxiety   . Arthritis   . Asthma    [employer]reesia 02/13/2021  . Cancer ([place])    left breast  . Headache    migraines  . Hyperlipidemia    [employer]reesia 02/13/2021  . PONV (postoperative nausea  and vomiting)   . Sleep apnea     Past Surgical History:  Procedure Laterality Date  . ABDOMINAL HYSTERECTOMY    . BREAST SURGERY     double mastectomy  . BREAST SURGERY     reconstruction  . COLONOSCOPY WITH PROPOFOL N/A 05/29/2016   Procedure: COLONOSCOPY WITH PROPOFOL;  Surgeon: Lollie Sails, MD;  Location: [facility] ENDOSCOPY;  Service: Endoscopy;  Laterality: N/A;  . KNEE ARTHROSCOPY Right   . MASTECTOMY      Family History  Problem Relation Age of Onset  . Hypertension Father     Social History   Socioeconomic History  . Marital status: Married    Spouse name: Not on file  . Number of children: Not on file  . Years of education: Not on file  . Highest education level: Not on file  Occupational History  . Not on file  Tobacco Use  . Smoking status: Never Smoker  . Smokeless tobacco: Never Used  Vaping Use  . Vaping Use: Never used  Substance and Sexual Activity  . Alcohol use: No  . Drug use: No  . Sexual activity: Not on file  Other Topics Concern  . Not on file  Social History Narrative  . Not on file   Social Determinants of Health   Financial Resource Strain: Not on file  Food Insecurity: Not on file  Transportation Needs: Not on file  Physical Activity: Not  on file  Stress: Not on file  Social Connections: Not on file  Intimate Partner Violence: Not on file    ROS Review of Systems  Constitutional: Negative for fatigue.  Respiratory: Negative for cough, shortness of breath and wheezing.   Skin: Positive for rash.       Has noted increased eczema on her hands, especially the right hand. Gets worse in colder months and when she has to wash her hands more frequently.   Allergic/Immunologic: Positive for environmental allergies.  Psychiatric/Behavioral: Positive for sleep disturbance. The patient is nervous/anxious.     Objective:   Today's Vitals   02/14/21 1108  BP: 129/83  Pulse: 83  Temp: (!) 97.1 F (36.2 C)  SpO2: 95%  Weight: 237  lb 1.6 oz (107.5 kg)  Height: 5\' 4"  (1.626 m)   Body mass index is 40.7 kg/m.    Physical Exam Vitals and nursing note reviewed.  Constitutional:      Appearance: Normal appearance. She is well-developed and well-nourished.  HENT:     Head: Normocephalic and atraumatic.     Nose: Nose normal.  Eyes:     Pupils: Pupils are equal, round, and reactive to light.  Neck:     Vascular: No carotid bruit.  Cardiovascular:     Rate and Rhythm: Normal rate and regular rhythm.     Pulses: Normal pulses.     Heart sounds: Normal heart sounds.  Pulmonary:     Effort: Pulmonary effort is normal.     Breath sounds: Normal breath sounds.  Abdominal:     Palpations: Abdomen is soft.     Tenderness: There is no abdominal tenderness.  Musculoskeletal:        General: Normal range of motion.     Cervical back: Normal range of motion and neck supple.  Skin:    General: Skin is warm and dry.     Findings: Erythema and rash present.     Comments: There is red, dry, itchy rash on the dorsal aspects of both hands, worse on right hane than left. Skin currently intact and there is no evidence of infection.   Neurological:     General: No focal deficit present.     Mental Status: She is alert and oriented to person, place, and time.  Psychiatric:        Mood and Affect: Mood and affect and mood normal.        Behavior: Behavior normal.        Thought Content: Thought content normal.        Judgment: Judgment normal.     Comments: [place] Office Visit from 02/14/2021 in [place] at  [facility] Total Score 11        Assessment & Plan:  1. Encounter to establish care Appointment today to establish new primary care provider.   2. Mixed hyperlipidemia Continue atorvastatin 10mg  every evening. Check fasting blood work at next visit and adjust medication as indicated.  - atorvastatin (LIPITOR) 10 MG tablet; Take 1 tablet (10 mg total) by mouth daily.  Dispense: 90  tablet; Refill: 3  3. Eczema, unspecified type Triamcinolone ointment may be applied to effected areas up to twice daily as needed.  - triamcinolone ointment (KENALOG) 0.5 %; Apply 1 application topically 2 (two) times daily.  Dispense: 30 g; Refill: 3  4. Generalized anxiety disorder Reviewed PDMP profile today with appropriate fill history. New prescription sent to her pharmacy today with  single refill.  - ALPRAZolam (XANAX) 1 MG tablet; Take 1 tablet (1 mg total) by mouth at bedtime as needed for anxiety.  Dispense: 90 tablet; Refill: 1  5. Primary insomnia Reviewed PDMP profile today with appropriate fill history. New prescription sent to her pharmacy today with single refill.  - zolpidem (AMBIEN) 10 MG tablet; Take 1 tablet (10 mg total) by mouth at bedtime as needed for sleep.  Dispense: 90 tablet; Refill: 1   Problem List Items Addressed This Visit      Musculoskeletal and Integument   Eczema   Relevant Medications   triamcinolone ointment (KENALOG) 0.5 %     Other   Generalized anxiety disorder   Relevant Medications   ALPRAZolam (XANAX) 1 MG tablet   Primary insomnia   Relevant Medications   zolpidem (AMBIEN) 10 MG tablet   Mixed hyperlipidemia   Relevant Medications   atorvastatin (LIPITOR) 10 MG tablet   Encounter to establish care - Primary   Healthcare maintenance      Outpatient Encounter Medications as of 02/14/2021  Medication Sig  . albuterol (VENTOLIN HFA) 108 (90 Base) MCG/ACT inhaler Inhale 2 puffs into the lungs every 6 (six) hours as needed for wheezing or shortness of breath.  . budesonide-formoterol (SYMBICORT) 160-4.5 MCG/ACT inhaler Inhale 2 puffs into the lungs 2 (two) times daily.  . clobetasol ointment (TEMOVATE) 7.82 % Apply 1 application topically daily as needed.  . valACYclovir (VALTREX) 1000 MG tablet Take 1 tablet (1,000 mg total) by mouth 2 (two) times daily.  . [DISCONTINUED] ALPRAZolam (XANAX) 1 MG tablet Take 1 tablet (1 mg total) by  mouth at bedtime as needed for anxiety.  . [DISCONTINUED] atorvastatin (LIPITOR) 10 MG tablet Take 1 tablet (10 mg total) by mouth daily.  . [DISCONTINUED] zolpidem (AMBIEN) 10 MG tablet Take 1 tablet (10 mg total) by mouth at bedtime as needed for sleep.  . [DISCONTINUED] zolpidem (AMBIEN) 10 MG tablet Take by mouth.  . ALPRAZolam (XANAX) 1 MG tablet Take 1 tablet (1 mg total) by mouth at bedtime as needed for anxiety.  Marland Kitchen atorvastatin (LIPITOR) 10 MG tablet Take 1 tablet (10 mg total) by mouth daily.  . cyclobenzaprine (FLEXERIL) 5 MG tablet Take 5 mg by mouth 3 (three) times daily as needed for muscle spasms. (Patient not taking: Reported on 02/14/2021)  . triamcinolone ointment (KENALOG) 0.5 % Apply 1 application topically 2 (two) times daily.  Marland Kitchen zolpidem (AMBIEN) 10 MG tablet Take 1 tablet (10 mg total) by mouth at bedtime as needed for sleep.  . [DISCONTINUED] amoxicillin-clavulanate (AUGMENTIN) 875-125 MG tablet Take 1 tablet by mouth 2 (two) times daily. (Patient not taking: Reported on 02/14/2021)  . [DISCONTINUED] azithromycin (ZITHROMAX Z-PAK) 250 MG tablet Take two 250 mg tablets on day one followed by one 250 mg tablet each day for four days. (Patient not taking: Reported on 02/14/2021)  . [DISCONTINUED] chlorpheniramine-HYDROcodone (TUSSIONEX PENNKINETIC ER) 10-8 MG/5ML SUER Take 5 mLs by mouth at bedtime as needed for cough. (Patient not taking: Reported on 02/14/2021)  . [DISCONTINUED] ciprofloxacin (CILOXAN) 0.3 % ophthalmic solution Insert 4 drops into right ear twice daily. (Patient not taking: Reported on 02/14/2021)  . [DISCONTINUED] predniSONE (DELTASONE) 10 MG tablet Take 1 tablet three times a day with a meal for three for three days, take 1 tablet by twice daily with a meal for 3 days, take 1 tablet once daily with a meal for 3 days (Patient not taking: Reported on 02/14/2021)  . [DISCONTINUED] triamcinolone ointment (  KENALOG) 0.5 % Apply 1 application topically 2 (two) times daily.  (Patient not taking: Reported on 02/14/2021)   No facility-administered encounter medications on file as of 02/14/2021.   Time spent with the patient was approximately 45 minutes. This time included reviewing progress notes, labs, imaging studies, and discussing plan for follow up.    Follow-up: Return in about 6 months (around 08/17/2021) for CPE.   Ronnell Freshwater, NP

## 2021-02-14 NOTE — Patient Instructions (Signed)
Eczema Eczema refers to a group of skin conditions that cause skin to become rough and inflamed. Each type of eczema has different triggers, symptoms, and treatments. Eczema of any type is usually itchy. Symptoms range from mild to severe. Eczema is not spread from person to person (is not contagious). It can appear on different parts of the body at different times. One person's eczema may look different from another person's eczema. What are the causes? The exact cause of this condition is not known. However, exposure to certain environmental factors, irritants, and allergens can make the condition worse. What are the signs or symptoms? Symptoms of this condition depend on the type of eczema you have. The types include:  Contact dermatitis. There are two kinds: ? Irritant contact dermatitis. This happens when something irritates the skin and causes a rash. ? Allergic contact dermatitis. This happens when your skin comes in contact with something you are allergic to (allergens). This can include poison ivy, chemicals, or medicines that were applied to your skin.  Atopic dermatitis. This is a long-term (chronic) skin disease that keeps coming back (recurring). It is the most common type of eczema. Usual symptoms are a red rash and itchy, dry, scaly skin. It usually starts showing signs in infancy and can last through adulthood.  Dyshidrotic eczema. This is a form of eczema on the hands and feet. It shows up as very itchy, fluid-filled blisters. It can affect people of any age but is more common before age 40.  Hand eczema. This causes very itchy areas of skin on the palms and sides of the hands and fingers. This type of eczema is common in industrial jobs where you may be exposed to different types of irritants.  Lichen simplex chronicus. This type of eczema occurs when a person constantly scratches one area of the body. Repeated scratching of the area leads to thickened skin (lichenification). This  condition can accompany other types of eczema. It is more common in adults but may also be seen in children.  Nummular eczema. This is a common type of eczema that most often affects the lower legs and the backs of the hands. It typically causes an itchy, red, circular, crusty lesion (plaque). Scratching may become a habit and can cause bleeding. Nummular eczema occurs most often in middle-aged or older people.  Seborrheic dermatitis. This is a common skin disease that mainly affects the scalp. It may also affect other oily areas of the body, such as the face, sides of the nose, eyebrows, ears, eyelids, and chest. It is marked by small scaling and redness of the skin (erythema). This can affect people of all ages. In infants, this condition is called cradle cap.  Stasis dermatitis. This is a common skin disease that can cause itching, scaling, and hyperpigmentation, usually on the legs and feet. It occurs most often in people who have a condition that prevents blood from being pumped through the veins in the legs (chronic venous insufficiency). Stasis dermatitis is a chronic condition that needs long-term management.   How is this diagnosed? This condition may be diagnosed based on:  A physical exam of your skin.  Your medical history.  Skin patch tests. These tests involve using patches that contain possible allergens and placing them on your back. Your health care provider will check in a few days to see if an allergic reaction occurred. How is this treated? Treatment for eczema is based on the type of eczema you have. You may be   given hydrocortisone steroid medicine or antihistamines. These can relieve itching quickly and help reduce inflammation. These may be prescribed or purchased over the counter, depending on the strength that is needed. Follow these instructions at home:  Take or apply over-the-counter and prescription medicines only as told by your health care provider.  Use creams or  ointments to moisturize your skin. Do not use lotions.  Learn what triggers or irritates your symptoms so you can avoid these things.  Treat symptom flare-ups quickly.  Do not scratch your skin. This can make your rash worse.  Keep all follow-up visits. This is important. Where to find more information  American Academy of Dermatology: aad.org  National Eczema Association: nationaleczema.org  The Society for Pediatric Dermatology: pedsderm.net Contact a health care provider if:  You have severe itching, even with treatment.  You scratch your skin regularly until it bleeds.  Your rash looks different than usual.  Your skin is painful, swollen, or more red than usual.  You have a fever. Summary  Eczema refers to a group of skin conditions that cause skin to become rough and inflamed. Each type has different triggers.  Eczema of any type causes itching that may range from mild to severe.  Treatment varies based on the type of eczema you have. Hydrocortisone steroid medicine or antihistamines can help with itching and inflammation.  Protecting your skin is the best way to prevent eczema. Use creams or ointments to moisturize your skin. Avoid triggers and irritants. Treat flare-ups quickly. This information is not intended to replace advice given to you by your health care provider. Make sure you discuss any questions you have with your health care provider. Document Revised: 09/12/2020 Document Reviewed: 09/12/2020 Elsevier Patient Education  2021 [employer]  

## 2021-03-08 DIAGNOSIS — G4733 Obstructive sleep apnea (adult) (pediatric): Secondary | ICD-10-CM | POA: Diagnosis not present

## 2021-03-09 ENCOUNTER — Telehealth: Payer: Self-pay | Admitting: Nurse Practitioner

## 2021-03-09 NOTE — Telephone Encounter (Signed)
Spoke to pt please advised for appt  thanks

## 2021-03-13 ENCOUNTER — Encounter: Payer: Self-pay | Admitting: Nurse Practitioner

## 2021-03-13 ENCOUNTER — Other Ambulatory Visit: Payer: Self-pay

## 2021-03-13 ENCOUNTER — Ambulatory Visit: Payer: Medicare Other | Admitting: Nurse Practitioner

## 2021-03-13 VITALS — BP 119/81 | HR 82 | Temp 98.6°F | Ht 64.0 in | Wt 240.2 lb

## 2021-03-13 DIAGNOSIS — J0140 Acute pansinusitis, unspecified: Secondary | ICD-10-CM | POA: Insufficient documentation

## 2021-03-13 DIAGNOSIS — J4521 Mild intermittent asthma with (acute) exacerbation: Secondary | ICD-10-CM | POA: Diagnosis not present

## 2021-03-13 DIAGNOSIS — J0141 Acute recurrent pansinusitis: Secondary | ICD-10-CM | POA: Diagnosis not present

## 2021-03-13 DIAGNOSIS — R059 Cough, unspecified: Secondary | ICD-10-CM | POA: Diagnosis not present

## 2021-03-13 MED ORDER — HYDROCOD POLST-CPM POLST ER 10-8 MG/5ML PO SUER: 5.0000 mL | Freq: Two times a day (BID) | ORAL | 0 refills | Status: DC | PRN

## 2021-03-13 MED ORDER — PREDNISONE 10 MG (21) PO TBPK: ORAL_TABLET | ORAL | 0 refills | Status: DC

## 2021-03-13 MED ORDER — AZITHROMYCIN 250 MG PO TABS: ORAL_TABLET | ORAL | 0 refills | Status: DC

## 2021-03-13 NOTE — Progress Notes (Signed)
Acute Office Visit  Subjective:    Patient ID: Janet Austin, female    DOB: Oct 01, 1961, 60 y.o.   MRN: [id number]  Chief Complaint  Patient presents with  . Cough    HPI Patient is in today for evaluation of cough, sore throat, congestion, and sinus headache. She states that she does bring up mucus after she coughs for several minutes. She is also getting green and dark colored mucus every ime she blows her nose. She states that symptoms started last week on Thursday. She did have a low grade fever when they first started. She is no longer running a temperature. She states that she has taken mucinex amd advil to help symptoms. They helped for a short period and then symptoms come right back. She denies nausea, vomiting, or decreased appetite. She has taken three home COVID 19 tests and brought them with her. She took them last Thursday, Saturday, and yesterday. All three tests were negative. She does have mild intermittent asthma. She has been using her inhaler and neb treatments when needed to help with cough/wheezing. These do help.   Past Medical History:  Diagnosis Date  . Anxiety   . Arthritis   . Asthma    [employer]rees[employer] 02/13/2021  . Cancer ([place])    left breast  . Headache    migraines  . Hyperlipidemia    [employer]reesia 02/13/2021  . PONV (postoperative nausea and vomiting)   . Sleep apnea     Past Surgical History:  Procedure Laterality Date  . ABDOMINAL HYSTERECTOMY    . BREAST SURGERY     double mastectomy  . BREAST SURGERY     reconstruction  . COLONOSCOPY WITH PROPOFOL N/A 05/29/2016   Procedure: COLONOSCOPY WITH PROPOFOL;  Surgeon: Lollie Sails, MD;  Location: [facility] ENDOSCOPY;  Service: Endoscopy;  Laterality: N/A;  . KNEE ARTHROSCOPY Right   . MASTECTOMY      Family History  Problem Relation Age of Onset  . Hypertension Father     Social History   Socioeconomic History  . Marital status: Married    Spouse name: Not on file  . Number of children: Not on  file  . Years of education: Not on file  . Highest education level: Not on file  Occupational History  . Not on file  Tobacco Use  . Smoking status: Never Smoker  . Smokeless tobacco: Never Used  Vaping Use  . Vaping Use: Never used  Substance and Sexual Activity  . Alcohol use: No  . Drug use: No  . Sexual activity: Not on file  Other Topics Concern  . Not on file  Social History Narrative  . Not on file   Social Determinants of Health   Financial Resource Strain: Not on file  Food Insecurity: Not on file  Transportation Needs: Not on file  Physical Activity: Not on file  Stress: Not on file  Social Connections: Not on file  Intimate Partner Violence: Not on file    Outpatient Medications Prior to Visit  Medication Sig Dispense Refill  . albuterol (VENTOLIN HFA) 108 (90 Base) MCG/ACT inhaler Inhale 2 puffs into the lungs every 6 (six) hours as needed for wheezing or shortness of breath. 54 g 3  . ALPRAZolam (XANAX) 1 MG tablet Take 1 tablet (1 mg total) by mouth at bedtime as needed for anxiety. 90 tablet 1  . atorvastatin (LIPITOR) 10 MG tablet Take 1 tablet (10 mg total) by mouth daily. 90 tablet 3  .  budesonide-formoterol (SYMBICORT) 160-4.5 MCG/ACT inhaler Inhale 2 puffs into the lungs 2 (two) times daily. 1 each 5  . clobetasol ointment (TEMOVATE) 0.09 % Apply 1 application topically daily as needed. 60 g 0  . cyclobenzaprine (FLEXERIL) 5 MG tablet Take 5 mg by mouth 3 (three) times daily as needed for muscle spasms.    Marland Kitchen triamcinolone ointment (KENALOG) 0.5 % Apply 1 application topically 2 (two) times daily. 30 g 3  . valACYclovir (VALTREX) 1000 MG tablet Take 1 tablet (1,000 mg total) by mouth 2 (two) times daily. 180 tablet 1  . zolpidem (AMBIEN) 10 MG tablet Take 1 tablet (10 mg total) by mouth at bedtime as needed for sleep. 90 tablet 1   No facility-administered medications prior to visit.    Allergies  Allergen Reactions  . Other Itching and Rash  . Asa  [Aspirin]   . Gabapentin Other (See Comments)    Eye twitch  . Lyrica [Pregabalin] Other (See Comments)    Altered mental status  . Tape Rash    Patient states she had blisters and it was very itchy, ok to use paper tape and tegaderm     Review of Systems  Constitutional: Positive for fatigue and fever. Negative for chills.       Had low grade fever for several days. Now resolved.   HENT: Positive for congestion, ear pain, postnasal drip, rhinorrhea, sinus pressure, sinus pain and sore throat.   Eyes: Negative.   Respiratory: Positive for cough, shortness of breath and wheezing.   Cardiovascular: Negative for chest pain and palpitations.  Gastrointestinal: Negative for constipation, nausea and vomiting.  Musculoskeletal: Negative for back pain and myalgias.  Skin: Negative for rash.  Allergic/Immunologic: Positive for environmental allergies.  Neurological: Positive for headaches. Negative for dizziness and weakness.  Hematological: Positive for adenopathy.  Psychiatric/Behavioral: The patient is not nervous/anxious.   All other systems reviewed and are negative.      Objective:    Physical Exam Vitals and nursing note reviewed.  Constitutional:      Appearance: Normal appearance. She is well-developed. She is ill-appearing.  HENT:     Head: Normocephalic and atraumatic.     Right Ear: Tympanic membrane is erythematous and bulging.     Left Ear: Tympanic membrane is erythematous and bulging.     Nose: Congestion present.     Right Sinus: Maxillary sinus tenderness and frontal sinus tenderness present.     Left Sinus: Maxillary sinus tenderness and frontal sinus tenderness present.     Mouth/Throat:     Pharynx: Posterior oropharyngeal erythema present.     Comments: tonsillar swelling.  Eyes:     Pupils: Pupils are equal, round, and reactive to light.  Cardiovascular:     Rate and Rhythm: Normal rate and regular rhythm.     Heart sounds: Normal heart sounds.   Pulmonary:     Effort: Pulmonary effort is normal.     Breath sounds: Normal breath sounds.     Comments: Dry and hacking cough noted. Cough is nonproductive in the office.  Abdominal:     Palpations: Abdomen is soft.     Tenderness: There is no abdominal tenderness.  Musculoskeletal:        General: Normal range of motion.     Cervical back: Normal range of motion and neck supple.  Lymphadenopathy:     Cervical: Cervical adenopathy present.  Skin:    General: Skin is warm and dry.     Capillary Refill:  Capillary refill takes less than 2 seconds.  Neurological:     General: No focal deficit present.     Mental Status: She is alert and oriented to person, place, and time.  Psychiatric:        Mood and Affect: Mood normal.        Behavior: Behavior normal.        Thought Content: Thought content normal.        Judgment: Judgment normal.     Today's Vitals   03/13/21 1017  BP: 119/81  Pulse: 82  Temp: 98.6 F (37 C)  SpO2: 95%  Weight: 240 lb 3.2 oz (109 kg)  Height: 5\' 4"  (1.626 m)   Body mass index is 41.23 kg/m.    Wt Readings from Last 3 Encounters:  03/13/21 240 lb 3.2 oz (109 kg)  02/14/21 237 lb 1.6 oz (107.5 kg)  02/01/21 235 lb (106.6 kg)    There are no preventive care reminders to display for this patient.  There are no preventive care reminders to display for this patient.   Lab Results  Component Value Date   TSH 3.680 07/28/2020   Lab Results  Component Value Date   WBC 6.7 07/28/2020   HGB 13.9 07/28/2020   HCT 43.5 07/28/2020   MCV 85 07/28/2020   PLT 287 07/28/2020   Lab Results  Component Value Date   NA 139 07/28/2020   K 4.4 07/28/2020   CO2 22 07/28/2020   GLUCOSE 132 (H) 07/28/2020   BUN 12 07/28/2020   CREATININE 0.75 07/28/2020   BILITOT 0.2 07/28/2020   ALKPHOS 128 (H) 07/28/2020   AST 15 07/28/2020   ALT 18 07/28/2020   PROT 6.5 07/28/2020   ALBUMIN 4.4 07/28/2020   CALCIUM 9.1 07/28/2020   ANIONGAP 9 12/25/2012    Lab Results  Component Value Date   CHOL 181 07/28/2020   Lab Results  Component Value Date   HDL 50 07/28/2020   Lab Results  Component Value Date   LDLCALC 106 (H) 07/28/2020   Lab Results  Component Value Date   TRIG 139 07/28/2020   No results found for: [facility] Lab Results  Component Value Date   HGBA1C 6.1 (H) 07/28/2020       Assessment & Plan:  1. Recurrent pansinusitis Start zitrhomax 250mg  daily for next 10 days. Rest and increase fluids. Continue to take OTC medications as needed and as indicated for acute symptoms.  - azithromycin (ZITHROMAX) 250 MG tablet; Take 1 tablet po QD  Dispense: 10 tablet; Refill: 0  2. Mild intermittent asthma with acute exacerbation Add prednisone taper. Take as directed for 6 days. Use previously prescribed inhalers as needed and as prescribed.  - predniSONE (STERAPRED UNI-PAK 21 TAB) 10 MG (21) TBPK tablet; 6 day taper - take by mouth as directed for 6 days  Dispense: 21 tablet; Refill: 0  3. Cough Short term prescription for tussionex for cough. May take twice daily as needed for cough.  Prescribed tussionex cough suppressant to take twice daily as needed for cough. Recommended she use at night and when at home only, as this medication can cause significant drowsiness and dissiness.  Advised her against taking tussionex when taking other sedative medication such as alprazolam and zolpidem. She voiced understanding and agreement with this plan.  - chlorpheniramine-HYDROcodone (TUSSIONEX PENNKINETIC ER) 10-8 MG/5ML SUER; Take 5 mLs by mouth every 12 (twelve) hours as needed for cough.  Dispense: 115 mL; Refill: 0   Problem  List Items Addressed This Visit      Respiratory   Recurrent pansinusitis - Primary   Relevant Medications   azithromycin (ZITHROMAX) 250 MG tablet   predniSONE (STERAPRED UNI-PAK 21 TAB) 10 MG (21) TBPK tablet   chlorpheniramine-HYDROcodone (TUSSIONEX PENNKINETIC ER) 10-8 MG/5ML SUER   Mild intermittent  asthma with acute exacerbation   Relevant Medications   predniSONE (STERAPRED UNI-PAK 21 TAB) 10 MG (21) TBPK tablet     Other   Cough   Relevant Medications   chlorpheniramine-HYDROcodone (TUSSIONEX PENNKINETIC ER) 10-8 MG/5ML SUER       Meds ordered this encounter  Medications  . azithromycin (ZITHROMAX) 250 MG tablet    Sig: Take 1 tablet po QD    Dispense:  10 tablet    Refill:  0    Order Specific Question:   Supervising Provider    Answer:   Beatrice Lecher D [[id number]]  . predniSONE (STERAPRED UNI-PAK 21 TAB) 10 MG (21) TBPK tablet    Sig: 6 day taper - take by mouth as directed for 6 days    Dispense:  21 tablet    Refill:  0    Order Specific Question:   Supervising Provider    Answer:   Beatrice Lecher D [[id number]]  . chlorpheniramine-HYDROcodone (TUSSIONEX PENNKINETIC ER) 10-8 MG/5ML SUER    Sig: Take 5 mLs by mouth every 12 (twelve) hours as needed for cough.    Dispense:  115 mL    Refill:  0    Order Specific Question:   Supervising Provider    Answer:   Beatrice Lecher D [[id number]]   Time spent with the patient was approximately 22 minutes. This time included reviewing progress notes, labs, imaging studies, and discussing plan for follow up.   Ronnell Freshwater, NP

## 2021-03-13 NOTE — Patient Instructions (Signed)
Sinusitis, Adult Sinusitis is soreness and swelling (inflammation) of your sinuses. Sinuses are hollow spaces in the bones around your face. They are located:  Around your eyes.  In the middle of your forehead.  Behind your nose.  In your cheekbones. Your sinuses and nasal passages are lined with a fluid called mucus. Mucus drains out of your sinuses. Swelling can trap mucus in your sinuses. This lets germs (bacteria, virus, or fungus) grow, which leads to infection. Most of the time, this condition is caused by a virus. What are the causes? This condition is caused by:  Allergies.  Asthma.  Germs.  Things that block your nose or sinuses.  Growths in the nose (nasal polyps).  Chemicals or irritants in the air.  Fungus (rare). What increases the risk? You are more likely to develop this condition if:  You have a weak body defense system (immune system).  You do a lot of swimming or diving.  You use nasal sprays too much.  You smoke. What are the signs or symptoms? The main symptoms of this condition are pain and a feeling of pressure around the sinuses. Other symptoms include:  Stuffy nose (congestion).  Runny nose (drainage).  Swelling and warmth in the sinuses.  Headache.  Toothache.  A cough that may get worse at night.  Mucus that collects in the throat or the back of the nose (postnasal drip).  Being unable to smell and taste.  Being very tired (fatigue).  A fever.  Sore throat.  Bad breath. How is this diagnosed? This condition is diagnosed based on:  Your symptoms.  Your medical history.  A physical exam.  Tests to find out if your condition is short-term (acute) or long-term (chronic). Your doctor may: ? Check your nose for growths (polyps). ? Check your sinuses using a tool that has a light (endoscope). ? Check for allergies or germs. ? Do imaging tests, such as an MRI or CT scan. How is this treated? Treatment for this condition  depends on the cause and whether it is short-term or long-term.  If caused by a virus, your symptoms should go away on their own within 10 days. You may be given medicines to relieve symptoms. They include: ? Medicines that shrink swollen tissue in the nose. ? Medicines that treat allergies (antihistamines). ? A spray that treats swelling of the nostrils. ? Rinses that help get rid of thick mucus in your nose (nasal saline washes).  If caused by bacteria, your doctor may wait to see if you will get better without treatment. You may be given antibiotic medicine if you have: ? A very bad infection. ? A weak body defense system.  If caused by growths in the nose, you may need to have surgery. Follow these instructions at home: Medicines  Take, use, or apply over-the-counter and prescription medicines only as told by your doctor. These may include nasal sprays.  If you were prescribed an antibiotic medicine, take it as told by your doctor. Do not stop taking the antibiotic even if you start to feel better. Hydrate and humidify  Drink enough water to keep your pee (urine) pale yellow.  Use a cool mist humidifier to keep the humidity level in your home above 50%.  Breathe in steam for 10-15 minutes, 3-4 times a day, or as told by your doctor. You can do this in the bathroom while a hot shower is running.  Try not to spend time in cool or dry air.     Rest  Rest as much as you can.  Sleep with your head raised (elevated).  Make sure you get enough sleep each night. General instructions  Put a warm, moist washcloth on your face 3-4 times a day, or as often as told by your doctor. This will help with discomfort.  Wash your hands often with soap and water. If there is no soap and water, use hand sanitizer.  Do not smoke. Avoid being around people who are smoking (secondhand smoke).  Keep all follow-up visits as told by your doctor. This is important.   Contact a doctor if:  You  have a fever.  Your symptoms get worse.  Your symptoms do not get better within 10 days. Get help right away if:  You have a very bad headache.  You cannot stop throwing up (vomiting).  You have very bad pain or swelling around your face or eyes.  You have trouble seeing.  You feel confused.  Your neck is stiff.  You have trouble breathing. Summary  Sinusitis is swelling of your sinuses. Sinuses are hollow spaces in the bones around your face.  This condition is caused by tissues in your nose that become inflamed or swollen. This traps germs. These can lead to infection.  If you were prescribed an antibiotic medicine, take it as told by your doctor. Do not stop taking it even if you start to feel better.  Keep all follow-up visits as told by your doctor. This is important. This information is not intended to replace advice given to you by your health care provider. Make sure you discuss any questions you have with your health care provider. Document Revised: 05/05/2018 Document Reviewed: 05/05/2018 Elsevier Patient Education  2021 [employer].  Acute Bronchitis, Adult  Acute bronchitis is when air tubes in the lungs (bronchi) suddenly get swollen. The condition can make it hard for you to breathe. In adults, acute bronchitis usually goes away within 2 weeks. A cough caused by bronchitis may last up to 3 weeks. Smoking, allergies, and asthma can make the condition worse. What are the causes? This condition is caused by:  Cold and flu viruses. The most common cause of this condition is the virus that causes the common cold.  Bacteria.  Substances that irritate the lungs, including: ? Smoke from cigarettes and other types of tobacco. ? Dust and pollen. ? Fumes from chemicals, gases, or burned fuel. ? Other materials that pollute indoor or outdoor air.  Close contact with someone who has acute bronchitis. What increases the risk? The following factors may make you  more likely to develop this condition:  A weak body's defense system. This is also called the immune system.  Any condition that affects your lungs and breathing, such as asthma. What are the signs or symptoms? Symptoms of this condition include:  A cough.  Coughing up clear, yellow, or green mucus.  Wheezing.  Chest congestion.  Shortness of breath.  A fever.  Body aches.  Chills.  A sore throat. How is this treated? Acute bronchitis may go away over time without treatment. Your doctor may recommend:  Drinking more fluids.  Taking a medicine for a fever or cough.  Using a device that gets medicine into your lungs (inhaler).  Using a vaporizer or a humidifier. These are machines that add water or moisture in the air to help with coughing and poor breathing. Follow these instructions at home: Activity  Get a lot of rest.  Avoid places where  there are fumes from chemicals.  Return to your normal activities as told by your doctor. Ask your doctor what activities are safe for you. Lifestyle  Drink enough fluids to keep your pee (urine) pale yellow.  Do not drink alcohol.  Do not use any products that contain nicotine or tobacco, such as cigarettes, e-cigarettes, and chewing tobacco. If you need help quitting, ask your doctor. Be aware that: ? Your bronchitis will get worse if you smoke or breathe in other people's smoke (secondhand smoke). ? Your lungs will heal faster if you quit smoking. General instructions  Take over-the-counter and prescription medicines only as told by your doctor.  Use an inhaler, cool mist vaporizer, or humidifier as told by your doctor.  Rinse your mouth often with salt water. To make salt water, dissolve -1 tsp (3-6 g) of salt in 1 cup (237 mL) of warm water.  Keep all follow-up visits as told by your doctor. This is important.   How is this prevented? To lower your risk of getting this condition again:  Wash your hands often  with soap and water. If soap and water are not available, use hand sanitizer.  Avoid contact with people who have cold symptoms.  Try not to touch your mouth, nose, or eyes with your hands.  Make sure to get the flu shot every year.   Contact a doctor if:  Your symptoms do not get better in 2 weeks.  You vomit more than once or twice.  You have symptoms of loss of fluid from your body (dehydration). These include: ? Dark urine. ? Dry skin or eyes. ? Increased thirst. ? Headaches. ? Confusion. ? Muscle cramps. Get help right away if:  You cough up blood.  You have chest pain.  You have very bad shortness of breath.  You become dehydrated.  You faint or keep feeling like you are going to faint.  You keep vomiting.  You have a very bad headache.  Your fever or chills get worse. These symptoms may be an emergency. Do not wait to see if the symptoms will go away. Get medical help right away. Call your local emergency services (911 in the [place] Do not drive yourself to the hospital. Summary  Acute bronchitis is when air tubes in the lungs (bronchi) suddenly get swollen. In adults, acute bronchitis usually goes away within 2 weeks.  Take over-the-counter and prescription medicines only as told by your doctor.  Drink enough fluid to keep your pee (urine) pale yellow.  Contact a doctor if your symptoms do not improve after 2 weeks of treatment.  Get help right away if you cough up blood, faint, or have chest pain or shortness of breath. This information is not intended to replace advice given to you by your health care provider. Make sure you discuss any questions you have with your health care provider. Document Revised: 06/26/2019 Document Reviewed: 06/26/2019 Elsevier Patient Education  [place].

## 2021-03-20 ENCOUNTER — Telehealth: Payer: Self-pay | Admitting: Nurse Practitioner

## 2021-03-20 NOTE — Progress Notes (Signed)
  Chronic Care Management   Note  03/20/2021 Name: Janet Austin MRN: [id number] DOB: 1961/03/05  Janet Austin is a 60 y.o. year old female who is a primary care patient of Ronnell Freshwater, NP. I reached out to Janet Blanks Carr by phone today in response to a referral sent by Ms. Janet Blanks Delangel's PCP, Ronnell Freshwater, NP.   [name] was given information about Chronic Care Management services today including:  1. CCM service includes personalized support from designated clinical staff supervised by her physician, including individualized plan of care and coordination with other care providers 2. 24/7 contact phone numbers for assistance for urgent and routine care needs. 3. Service will only be billed when office clinical staff spend 20 minutes or more in a month to coordinate care. 4. Only one practitioner may furnish and bill the service in a calendar month. 5. The patient may stop CCM services at any time (effective at the end of the month) by phone call to the office staff.   Patient did not agree to enrollment in care management services and does not wish to consider at this time.  Follow up plan:   Carley Perdue [employer] Scheduler

## 2021-03-22 ENCOUNTER — Other Ambulatory Visit: Payer: Self-pay

## 2021-03-22 ENCOUNTER — Telehealth: Payer: Self-pay | Admitting: Nurse Practitioner

## 2021-03-22 ENCOUNTER — Encounter: Payer: Self-pay | Admitting: Nurse Practitioner

## 2021-03-22 ENCOUNTER — Ambulatory Visit: Payer: Medicare Other | Admitting: Nurse Practitioner

## 2021-03-22 VITALS — BP 151/84 | HR 94 | Temp 98.1°F | Ht 64.0 in | Wt 250.9 lb

## 2021-03-22 DIAGNOSIS — J4521 Mild intermittent asthma with (acute) exacerbation: Secondary | ICD-10-CM | POA: Diagnosis not present

## 2021-03-22 DIAGNOSIS — R030 Elevated blood-pressure reading, without diagnosis of hypertension: Secondary | ICD-10-CM

## 2021-03-22 DIAGNOSIS — J0141 Acute recurrent pansinusitis: Secondary | ICD-10-CM

## 2021-03-22 MED ORDER — AMOXICILLIN-POT CLAVULANATE 875-125 MG PO TABS
1.0000 | ORAL_TABLET | Freq: Two times a day (BID) | ORAL | 0 refills | Status: DC
Start: 2021-03-22 — End: 2021-06-23

## 2021-03-22 MED ORDER — PREDNISONE 10 MG (48) PO TBPK: ORAL_TABLET | ORAL | 0 refills | Status: DC

## 2021-03-22 NOTE — Patient Instructions (Signed)
Sinusitis, Adult Sinusitis is soreness and swelling (inflammation) of your sinuses. Sinuses are hollow spaces in the bones around your face. They are located:  Around your eyes.  In the middle of your forehead.  Behind your nose.  In your cheekbones. Your sinuses and nasal passages are lined with a fluid called mucus. Mucus drains out of your sinuses. Swelling can trap mucus in your sinuses. This lets germs (bacteria, virus, or fungus) grow, which leads to infection. Most of the time, this condition is caused by a virus. What are the causes? This condition is caused by:  Allergies.  Asthma.  Germs.  Things that block your nose or sinuses.  Growths in the nose (nasal polyps).  Chemicals or irritants in the air.  Fungus (rare). What increases the risk? You are more likely to develop this condition if:  You have a weak body defense system (immune system).  You do a lot of swimming or diving.  You use nasal sprays too much.  You smoke. What are the signs or symptoms? The main symptoms of this condition are pain and a feeling of pressure around the sinuses. Other symptoms include:  Stuffy nose (congestion).  Runny nose (drainage).  Swelling and warmth in the sinuses.  Headache.  Toothache.  A cough that may get worse at night.  Mucus that collects in the throat or the back of the nose (postnasal drip).  Being unable to smell and taste.  Being very tired (fatigue).  A fever.  Sore throat.  Bad breath. How is this diagnosed? This condition is diagnosed based on:  Your symptoms.  Your medical history.  A physical exam.  Tests to find out if your condition is short-term (acute) or long-term (chronic). Your doctor may: ? Check your nose for growths (polyps). ? Check your sinuses using a tool that has a light (endoscope). ? Check for allergies or germs. ? Do imaging tests, such as an MRI or CT scan. How is this treated? Treatment for this condition  depends on the cause and whether it is short-term or long-term.  If caused by a virus, your symptoms should go away on their own within 10 days. You may be given medicines to relieve symptoms. They include: ? Medicines that shrink swollen tissue in the nose. ? Medicines that treat allergies (antihistamines). ? A spray that treats swelling of the nostrils. ? Rinses that help get rid of thick mucus in your nose (nasal saline washes).  If caused by bacteria, your doctor may wait to see if you will get better without treatment. You may be given antibiotic medicine if you have: ? A very bad infection. ? A weak body defense system.  If caused by growths in the nose, you may need to have surgery. Follow these instructions at home: Medicines  Take, use, or apply over-the-counter and prescription medicines only as told by your doctor. These may include nasal sprays.  If you were prescribed an antibiotic medicine, take it as told by your doctor. Do not stop taking the antibiotic even if you start to feel better. Hydrate and humidify  Drink enough water to keep your pee (urine) pale yellow.  Use a cool mist humidifier to keep the humidity level in your home above 50%.  Breathe in steam for 10-15 minutes, 3-4 times a day, or as told by your doctor. You can do this in the bathroom while a hot shower is running.  Try not to spend time in cool or dry air.     Rest  Rest as much as you can.  Sleep with your head raised (elevated).  Make sure you get enough sleep each night. General instructions  Put a warm, moist washcloth on your face 3-4 times a day, or as often as told by your doctor. This will help with discomfort.  Wash your hands often with soap and water. If there is no soap and water, use hand sanitizer.  Do not smoke. Avoid being around people who are smoking (secondhand smoke).  Keep all follow-up visits as told by your doctor. This is important.   Contact a doctor if:  You  have a fever.  Your symptoms get worse.  Your symptoms do not get better within 10 days. Get help right away if:  You have a very bad headache.  You cannot stop throwing up (vomiting).  You have very bad pain or swelling around your face or eyes.  You have trouble seeing.  You feel confused.  Your neck is stiff.  You have trouble breathing. Summary  Sinusitis is swelling of your sinuses. Sinuses are hollow spaces in the bones around your face.  This condition is caused by tissues in your nose that become inflamed or swollen. This traps germs. These can lead to infection.  If you were prescribed an antibiotic medicine, take it as told by your doctor. Do not stop taking it even if you start to feel better.  Keep all follow-up visits as told by your doctor. This is important. This information is not intended to replace advice given to you by your health care provider. Make sure you discuss any questions you have with your health care provider. Document Revised: 05/05/2018 Document Reviewed: 05/05/2018 Elsevier Patient Education  2021 [place].  Asthma, Adult  Asthma is a long-term (chronic) condition in which the airways get tight and narrow. The airways are the breathing passages that lead from the nose and mouth down into the lungs. A person with asthma will have times when symptoms get worse. These are called asthma attacks. They can cause coughing, whistling sounds when you breathe (wheezing), shortness of breath, and chest pain. They can make it hard to breathe. There is no cure for asthma, but medicines and lifestyle changes can help control it. There are many things that can bring on an asthma attack or make asthma symptoms worse (triggers). Common triggers include:  Mold.  Dust.  Cigarette smoke.  Cockroaches.  Things that can cause allergy symptoms (allergens). These include animal skin flakes (dander) and pollen from trees or grass.  Things that pollute the  air. These may include household cleaners, wood smoke, smog, or chemical odors.  Cold air, weather changes, and wind.  Crying or laughing hard.  Stress.  Certain medicines or drugs.  Certain foods such as dried fruit, potato chips, and grape juice.  Infections, such as a cold or the flu.  Certain medical conditions or diseases.  Exercise or tiring activities. Asthma may be treated with medicines and by staying away from the things that cause asthma attacks. Types of medicines may include:  Controller medicines. These help prevent asthma symptoms. They are usually taken every day.  Fast-acting reliever or rescue medicines. These quickly relieve asthma symptoms. They are used as needed and provide short-term relief.  Allergy medicines if your attacks are brought on by allergens.  Medicines to help control the body's defense (immune) system. Follow these instructions at home: Avoiding triggers in your home  Change your heating and air conditioning filter  often.  Limit your use of fireplaces and wood stoves.  Get rid of pests (such as roaches and mice) and their droppings.  Throw away plants if you see mold on them.  Clean your floors. Dust regularly. Use cleaning products that do not smell.  Have someone vacuum when you are not home. Use a vacuum cleaner with a HEPA filter if possible.  Replace carpet with wood, tile, or vinyl flooring. Carpet can trap animal skin flakes and dust.  Use allergy-proof pillows, mattress covers, and box spring covers.  Wash bed sheets and blankets every week in hot water. Dry them in a dryer.  Keep your bedroom free of any triggers.  Avoid pets and keep windows closed when things that cause allergy symptoms are in the air.  Use blankets that are made of polyester or cotton.  Clean bathrooms and kitchens with bleach. If possible, have someone repaint the walls in these rooms with mold-resistant paint. Keep out of the rooms that are being  cleaned and painted.  Wash your hands often with soap and water. If soap and water are not available, use hand sanitizer.  Do not allow anyone to smoke in your home. General instructions  Take over-the-counter and prescription medicines only as told by your doctor. ? Talk with your doctor if you have questions about how or when to take your medicines. ? Make note if you need to use your medicines more often than usual.  Do not use any products that contain nicotine or tobacco, such as cigarettes and e-cigarettes. If you need help quitting, ask your doctor.  Stay away from secondhand smoke.  Avoid doing things outdoors when allergen counts are high and when air quality is low.  Wear a ski mask when doing outdoor activities in the winter. The mask should cover your nose and mouth. Exercise indoors on cold days if you can.  Warm up before you exercise. Take time to cool down after exercise.  Use a peak flow meter as told by your doctor. A peak flow meter is a tool that measures how well the lungs are working.  Keep track of the peak flow meter's readings. Write them down.  Follow your asthma action plan. This is a written plan for taking care of your asthma and treating your attacks.  Make sure you get all the shots (vaccines) that your doctor recommends. Ask your doctor about a flu shot and a pneumonia shot.  Keep all follow-up visits as told by your doctor. This is important. Contact a doctor if:  You have wheezing, shortness of breath, or a cough even while taking medicine to prevent attacks.  The mucus you cough up (sputum) is thicker than usual.  The mucus you cough up changes from clear or white to yellow, green, gray, or bloody.  You have problems from the medicine you are taking, such as: ? A rash. ? Itching. ? Swelling. ? Trouble breathing.  You need reliever medicines more than 2-3 times a week.  Your peak flow reading is still at 50-79% of your personal best  after following the action plan for 1 hour.  You have a fever. Get help right away if:  You seem to be worse and are not responding to medicine during an asthma attack.  You are short of breath even at rest.  You get short of breath when doing very little activity.  You have trouble eating, drinking, or talking.  You have chest pain or tightness.  You have  a fast heartbeat.  Your lips or fingernails start to turn blue.  You are light-headed or dizzy, or you faint.  Your peak flow is less than 50% of your personal best.  You feel too tired to breathe normally. Summary  Asthma is a long-term (chronic) condition in which the airways get tight and narrow. An asthma attack can make it hard to breathe.  Asthma cannot be cured, but medicines and lifestyle changes can help control it.  Make sure you understand how to avoid triggers and how and when to use your medicines. This information is not intended to replace advice given to you by your health care provider. Make sure you discuss any questions you have with your health care provider. Document Revised: 04/06/2020 Document Reviewed: 04/06/2020 Elsevier Patient Education  2021 [employer].

## 2021-03-22 NOTE — Progress Notes (Signed)
Acute Office Visit  Subjective:    Patient ID: Janet Austin, female    DOB: [birth year], 61 y.o.   MRN: [id number]  Chief Complaint  Patient presents with  . Nasal Congestion  . Cough    HPI Patient is in today for sick visit. She continues to have cough, congestion, sore throat, and headache. She has taken a home COVID 19 test today and the test was negative. She was recently treated for recurrent pansinusitis on 03/13/2021. She completed her z-pack and six day prednisone taper. She states that symptoms initially got better. Three days ago, symptoms restarted. Now, symptoms are worse than when she was originally seen. She also has a low grade fever. She is also wheezing.  She denies chest pain, chest pressure, and shortness of breath. She states that she does have cough and mild wheezing. She denies nausea, vomiting, or changes in bowel or bladder habits.   Past Medical History:  Diagnosis Date  . Anxiety   . Arthritis   . Asthma    [employer]rees[employer] 02/13/2021  . Cancer ([place])    left breast  . Headache    migraines  . Hyperlipidemia    [employer]reesia 02/13/2021  . PONV (postoperative nausea and vomiting)   . Sleep apnea     Past Surgical History:  Procedure Laterality Date  . ABDOMINAL HYSTERECTOMY    . BREAST SURGERY     double mastectomy  . BREAST SURGERY     reconstruction  . COLONOSCOPY WITH PROPOFOL N/A 05/29/2016   Procedure: COLONOSCOPY WITH PROPOFOL;  Surgeon: Lollie Sails, MD;  Location: [facility] ENDOSCOPY;  Service: Endoscopy;  Laterality: N/A;  . KNEE ARTHROSCOPY Right   . MASTECTOMY      Family History  Problem Relation Age of Onset  . Hypertension Father     Social History   Socioeconomic History  . Marital status: Married    Spouse name: Not on file  . Number of children: Not on file  . Years of education: Not on file  . Highest education level: Not on file  Occupational History  . Not on file  Tobacco Use  . Smoking status: Never Smoker  . Smokeless  tobacco: Never Used  Vaping Use  . Vaping Use: Never used  Substance and Sexual Activity  . Alcohol use: No  . Drug use: No  . Sexual activity: Not on file  Other Topics Concern  . Not on file  Social History Narrative  . Not on file   Social Determinants of Health   Financial Resource Strain: Not on file  Food Insecurity: Not on file  Transportation Needs: Not on file  Physical Activity: Not on file  Stress: Not on file  Social Connections: Not on file  Intimate Partner Violence: Not on file    Outpatient Medications Prior to Visit  Medication Sig Dispense Refill  . albuterol (VENTOLIN HFA) 108 (90 Base) MCG/ACT inhaler Inhale 2 puffs into the lungs every 6 (six) hours as needed for wheezing or shortness of breath. 54 g 3  . ALPRAZolam (XANAX) 1 MG tablet Take 1 tablet (1 mg total) by mouth at bedtime as needed for anxiety. 90 tablet 1  . atorvastatin (LIPITOR) 10 MG tablet Take 1 tablet (10 mg total) by mouth daily. 90 tablet 3  . azithromycin (ZITHROMAX) 250 MG tablet Take 1 tablet po QD 10 tablet 0  . budesonide-formoterol (SYMBICORT) 160-4.5 MCG/ACT inhaler Inhale 2 puffs into the lungs 2 (two) times daily. 1 each  5  . chlorpheniramine-HYDROcodone (TUSSIONEX PENNKINETIC ER) 10-8 MG/5ML SUER Take 5 mLs by mouth every 12 (twelve) hours as needed for cough. 115 mL 0  . clobetasol ointment (TEMOVATE) 0.81 % Apply 1 application topically daily as needed. 60 g 0  . cyclobenzaprine (FLEXERIL) 5 MG tablet Take 5 mg by mouth 3 (three) times daily as needed for muscle spasms.    Marland Kitchen triamcinolone ointment (KENALOG) 0.5 % Apply 1 application topically 2 (two) times daily. 30 g 3  . valACYclovir (VALTREX) 1000 MG tablet Take 1 tablet (1,000 mg total) by mouth 2 (two) times daily. 180 tablet 1  . zolpidem (AMBIEN) 10 MG tablet Take 1 tablet (10 mg total) by mouth at bedtime as needed for sleep. 90 tablet 1  . predniSONE (STERAPRED UNI-PAK 21 TAB) 10 MG (21) TBPK tablet 6 day taper - take by  mouth as directed for 6 days 21 tablet 0   No facility-administered medications prior to visit.    Allergies  Allergen Reactions  . Other Itching and Rash  . Asa [Aspirin]   . Gabapentin Other (See Comments)    Eye twitch  . Lyrica [Pregabalin] Other (See Comments)    Altered mental status  . Tape Rash    Patient states she had blisters and it was very itchy, ok to use paper tape and tegaderm     Review of Systems  Constitutional: Positive for fatigue and fever. Negative for chills.  HENT: Positive for congestion, postnasal drip, rhinorrhea, sinus pressure, sinus pain and sore throat.   Eyes: Negative.   Respiratory: Positive for cough and wheezing.   Cardiovascular: Negative for chest pain and palpitations.       Blood pressure slightly elevated today.   Gastrointestinal: Negative for constipation, diarrhea, nausea and vomiting.  Genitourinary: Negative.   Musculoskeletal: Positive for arthralgias. Negative for back pain and myalgias.  Skin: Negative for rash.  Allergic/Immunologic: Positive for environmental allergies.  Neurological: Positive for headaches. Negative for dizziness and weakness.  Hematological: Positive for adenopathy.  Psychiatric/Behavioral: The patient is not nervous/anxious.   All other systems reviewed and are negative.      Objective:    Physical Exam Vitals and nursing note reviewed.  Constitutional:      Appearance: Normal appearance. She is well-developed. She is ill-appearing.  HENT:     Head: Normocephalic and atraumatic.     Right Ear: Tympanic membrane is bulging.     Left Ear: Tympanic membrane is bulging.     Nose: Congestion present.     Right Turbinates: Swollen.     Left Turbinates: Swollen.     Right Sinus: Maxillary sinus tenderness and frontal sinus tenderness present.     Left Sinus: Maxillary sinus tenderness and frontal sinus tenderness present.  Eyes:     Pupils: Pupils are equal, round, and reactive to light.   Cardiovascular:     Rate and Rhythm: Normal rate and regular rhythm.     Pulses: Normal pulses.     Heart sounds: Normal heart sounds.  Pulmonary:     Effort: Pulmonary effort is normal.     Breath sounds: Normal breath sounds.     Comments: Scant expiratory wheezes heard in right lobe of the lungs. She does have congested, non productive cough Abdominal:     Palpations: Abdomen is soft.  Musculoskeletal:        General: Normal range of motion.     Cervical back: Normal range of motion and neck supple.  Lymphadenopathy:  Cervical: Cervical adenopathy present.  Skin:    General: Skin is warm and dry.     Capillary Refill: Capillary refill takes less than 2 seconds.  Neurological:     General: No focal deficit present.     Mental Status: She is alert and oriented to person, place, and time.  Psychiatric:        Mood and Affect: Mood normal.        Behavior: Behavior normal.     Today's Vitals   03/22/21 1406  BP: (!) 151/84  Pulse: 94  Temp: 98.1 F (36.7 C)  SpO2: 95%  Weight: 250 lb 14.4 oz (113.8 kg)  Height: 5\' 4"  (1.626 m)   Body mass index is 43.07 kg/m. Wt Readings from Last 3 Encounters:  03/22/21 250 lb 14.4 oz (113.8 kg)  03/13/21 240 lb 3.2 oz (109 kg)  02/14/21 237 lb 1.6 oz (107.5 kg)    There are no preventive care reminders to display for this patient.  There are no preventive care reminders to display for this patient.   Lab Results  Component Value Date   TSH 3.680 07/28/2020   Lab Results  Component Value Date   WBC 6.7 07/28/2020   HGB 13.9 07/28/2020   HCT 43.5 07/28/2020   MCV 85 07/28/2020   PLT 287 07/28/2020   Lab Results  Component Value Date   NA 139 07/28/2020   K 4.4 07/28/2020   CO2 22 07/28/2020   GLUCOSE 132 (H) 07/28/2020   BUN 12 07/28/2020   CREATININE 0.75 07/28/2020   BILITOT 0.2 07/28/2020   ALKPHOS 128 (H) 07/28/2020   AST 15 07/28/2020   ALT 18 07/28/2020   PROT 6.5 07/28/2020   ALBUMIN 4.4 07/28/2020    CALCIUM 9.1 07/28/2020   ANIONGAP 9 12/25/2012   Lab Results  Component Value Date   CHOL 181 07/28/2020   Lab Results  Component Value Date   HDL 50 07/28/2020   Lab Results  Component Value Date   LDLCALC 106 (H) 07/28/2020   Lab Results  Component Value Date   TRIG 139 07/28/2020   No results found for: [facility] Lab Results  Component Value Date   HGBA1C 6.1 (H) 07/28/2020       Assessment & Plan:  1. Recurrent pansinusitis Start augmentin 875mg  twice daily for next 10 days. Rest and increase fluids. Take OTC medication as needed and as indicated for acute symptoms.  - amoxicillin-clavulanate (AUGMENTIN) 875-125 MG tablet; Take 1 tablet by mouth 2 (two) times daily.  Dispense: 20 tablet; Refill: 0  2. Mild intermittent asthma with acute exacerbation Add prednisone taper. Take as directed for 12 days. Use rescue inhaler as needed and as prescribed.  - predniSONE (STERAPRED UNI-PAK 48 TAB) 10 MG (48) TBPK tablet; 12 day taper - take by mouth as directed for 12 days  Dispense: 48 tablet; Refill: 0  3. Elevated blood pressure reading without diagnosis of hypertension This is likely due to illness. Advised her to monitor at home. Notify office if blood pressure is consistently running over 150s over 90s.   Problem List Items Addressed This Visit      Respiratory   Recurrent pansinusitis - Primary   Relevant Medications   predniSONE (STERAPRED UNI-PAK 48 TAB) 10 MG (48) TBPK tablet   amoxicillin-clavulanate (AUGMENTIN) 875-125 MG tablet   Mild intermittent asthma with acute exacerbation   Relevant Medications   predniSONE (STERAPRED UNI-PAK 48 TAB) 10 MG (48) TBPK tablet  Other   Elevated blood pressure reading without diagnosis of hypertension       Meds ordered this encounter  Medications  . predniSONE (STERAPRED UNI-PAK 48 TAB) 10 MG (48) TBPK tablet    Sig: 12 day taper - take by mouth as directed for 12 days    Dispense:  48 tablet    Refill:  0     Order Specific Question:   Supervising Provider    Answer:   Beatrice Lecher D [[id number]]  . amoxicillin-clavulanate (AUGMENTIN) 875-125 MG tablet    Sig: Take 1 tablet by mouth 2 (two) times daily.    Dispense:  20 tablet    Refill:  0    Order Specific Question:   Supervising Provider    Answer:   Beatrice Lecher D [[id number]]   Time spent with the patient was approximately 25 minutes. This time included reviewing progress notes, labs, imaging studies, and discussing plan for follow up.   Ronnell Freshwater, NP

## 2021-03-22 NOTE — Telephone Encounter (Signed)
Please contact patient and schedule in office apt with Heather. AS, CMA

## 2021-03-22 NOTE — Telephone Encounter (Signed)
Patient scheduled.

## 2021-03-22 NOTE — Telephone Encounter (Signed)
Patient called in stated she is not feeling well. She has head and chest congestion, cough which is getting worse, fever was 99 this morning, and last night 99.5.Janet Austin She is coughing so much it is wearing her out. She is also tired. Please advise, thanks.

## 2021-03-27 DIAGNOSIS — R030 Elevated blood-pressure reading, without diagnosis of hypertension: Secondary | ICD-10-CM | POA: Insufficient documentation

## 2021-04-06 DIAGNOSIS — G4733 Obstructive sleep apnea (adult) (pediatric): Secondary | ICD-10-CM | POA: Diagnosis not present

## 2021-05-05 DIAGNOSIS — M3501 Sicca syndrome with keratoconjunctivitis: Secondary | ICD-10-CM | POA: Diagnosis not present

## 2021-05-10 DIAGNOSIS — G4733 Obstructive sleep apnea (adult) (pediatric): Secondary | ICD-10-CM | POA: Diagnosis not present

## 2021-06-08 DIAGNOSIS — G4733 Obstructive sleep apnea (adult) (pediatric): Secondary | ICD-10-CM | POA: Diagnosis not present

## 2021-06-23 ENCOUNTER — Ambulatory Visit (INDEPENDENT_AMBULATORY_CARE_PROVIDER_SITE_OTHER): Payer: Medicare Other | Admitting: Nurse Practitioner

## 2021-06-23 ENCOUNTER — Other Ambulatory Visit: Payer: Self-pay

## 2021-06-23 ENCOUNTER — Encounter: Payer: Self-pay | Admitting: Nurse Practitioner

## 2021-06-23 VITALS — BP 125/83 | HR 81 | Temp 98.2°F | Ht 64.0 in | Wt 244.4 lb

## 2021-06-23 DIAGNOSIS — J4521 Mild intermittent asthma with (acute) exacerbation: Secondary | ICD-10-CM

## 2021-06-23 DIAGNOSIS — B373 Candidiasis of vulva and vagina: Secondary | ICD-10-CM

## 2021-06-23 DIAGNOSIS — J0141 Acute recurrent pansinusitis: Secondary | ICD-10-CM | POA: Diagnosis not present

## 2021-06-23 DIAGNOSIS — B3731 Acute candidiasis of vulva and vagina: Secondary | ICD-10-CM

## 2021-06-23 DIAGNOSIS — R059 Cough, unspecified: Secondary | ICD-10-CM

## 2021-06-23 MED ORDER — FLUCONAZOLE 150 MG PO TABS: ORAL_TABLET | ORAL | 0 refills | Status: DC

## 2021-06-23 MED ORDER — AMOXICILLIN-POT CLAVULANATE 875-125 MG PO TABS: 1.0000 | ORAL_TABLET | Freq: Two times a day (BID) | ORAL | 0 refills | Status: DC

## 2021-06-23 MED ORDER — HYDROCOD POLST-CPM POLST ER 10-8 MG/5ML PO SUER: 5.0000 mL | Freq: Two times a day (BID) | ORAL | 0 refills | Status: DC | PRN

## 2021-06-23 MED ORDER — PREDNISONE 10 MG (48) PO TBPK
ORAL_TABLET | ORAL | 0 refills | Status: DC
Start: 2021-06-23 — End: 2021-07-03

## 2021-06-23 NOTE — Progress Notes (Signed)
Acute Office Visit  Subjective:    Patient ID: Janet Austin, female    DOB: 1961/11/19, 60 y.o.   MRN: [id number]  Chief Complaint  Patient presents with   Cough    HPI Patient is in today for evaluation of upper respiratory type symptoms.  She has cough, congestion, scratchy throat.  She has had low-grade fevers and sinus pressure and headache.  She does have some body aches as well.  She denies nausea and vomiting.  She is taken 2 at home COVID test.  Both were negative.  She states she has been taking over-the-counter Tylenol and congestion relief medication.  She been taking Delsym for cough.  She is gotten little to no relief with over-the-counter medications.  Cough is severe and keeping her awake at night.  Nonproductive.  Past Medical History:  Diagnosis Date   Anxiety    Arthritis    Asthma    [employer] 02/13/2021   Cancer ([place])    left breast   Headache    migraines   Hyperlipidemia    [employer]rees[employer] 02/13/2021   PONV (postoperative nausea and vomiting)    Sleep apnea     Past Surgical History:  Procedure Laterality Date   ABDOMINAL HYSTERECTOMY     BREAST SURGERY     double mastectomy   BREAST SURGERY     reconstruction   COLONOSCOPY WITH PROPOFOL N/A 05/29/2016   Procedure: COLONOSCOPY WITH PROPOFOL;  Surgeon: Lollie Sails, MD;  Location: [facility] ENDOSCOPY;  Service: Endoscopy;  Laterality: N/A;   KNEE ARTHROSCOPY Right    MASTECTOMY      Family History  Problem Relation Age of Onset   Hypertension Father     Social History   Socioeconomic History   Marital status: Married    Spouse name: Not on file   Number of children: Not on file   Years of education: Not on file   Highest education level: Not on file  Occupational History   Not on file  Tobacco Use   Smoking status: Never   Smokeless tobacco: Never  Vaping Use   Vaping Use: Never used  Substance and Sexual Activity   Alcohol use: No   Drug use: No   Sexual activity: Not on file  Other  Topics Concern   Not on file  Social History Narrative   Not on file   Social Determinants of Health   Financial Resource Strain: Not on file  Food Insecurity: Not on file  Transportation Needs: Not on file  Physical Activity: Not on file  Stress: Not on file  Social Connections: Not on file  Intimate Partner Violence: Not on file    Outpatient Medications Prior to Visit  Medication Sig Dispense Refill   albuterol (VENTOLIN HFA) 108 (90 Base) MCG/ACT inhaler Inhale 2 puffs into the lungs every 6 (six) hours as needed for wheezing or shortness of breath. 54 g 3   ALPRAZolam (XANAX) 1 MG tablet Take 1 tablet (1 mg total) by mouth at bedtime as needed for anxiety. 90 tablet 1   atorvastatin (LIPITOR) 10 MG tablet Take 1 tablet (10 mg total) by mouth daily. 90 tablet 3   budesonide-formoterol (SYMBICORT) 160-4.5 MCG/ACT inhaler Inhale 2 puffs into the lungs 2 (two) times daily. 1 each 5   clobetasol ointment (TEMOVATE) 2.58 % Apply 1 application topically daily as needed. 60 g 0   cyclobenzaprine (FLEXERIL) 5 MG tablet Take 5 mg by mouth 3 (three) times daily as needed  for muscle spasms.     triamcinolone ointment (KENALOG) 0.5 % Apply 1 application topically 2 (two) times daily. 30 g 3   valACYclovir (VALTREX) 1000 MG tablet Take 1 tablet (1,000 mg total) by mouth 2 (two) times daily. 180 tablet 1   zolpidem (AMBIEN) 10 MG tablet Take 1 tablet (10 mg total) by mouth at bedtime as needed for sleep. 90 tablet 1   chlorpheniramine-HYDROcodone (TUSSIONEX PENNKINETIC ER) 10-8 MG/5ML SUER Take 5 mLs by mouth every 12 (twelve) hours as needed for cough. 115 mL 0   predniSONE (STERAPRED UNI-PAK 48 TAB) 10 MG (48) TBPK tablet 12 day taper - take by mouth as directed for 12 days 48 tablet 0   azithromycin (ZITHROMAX) 250 MG tablet Take 1 tablet po QD (Patient not taking: Reported on 06/23/2021) 10 tablet 0   amoxicillin-clavulanate (AUGMENTIN) 875-125 MG tablet Take 1 tablet by mouth 2 (two) times  daily. (Patient not taking: Reported on 06/23/2021) 20 tablet 0   No facility-administered medications prior to visit.    Allergies  Allergen Reactions   Other Itching and Rash   Asa [Aspirin]    Gabapentin Other (See Comments)    Eye twitch   Lyrica [Pregabalin] Other (See Comments)    Altered mental status   Tape Rash    Patient states she had blisters and it was very itchy, ok to use paper tape and tegaderm     Review of Systems  Constitutional:  Positive for chills. Negative for fatigue.  HENT:  Positive for congestion, ear pain, postnasal drip, rhinorrhea, sinus pain, sore throat and voice change.   Eyes: Negative.   Respiratory:  Positive for cough and wheezing.   Cardiovascular:  Negative for chest pain and palpitations.  Gastrointestinal:  Negative for constipation, diarrhea, nausea and vomiting.  Endocrine: Negative.   Genitourinary:        Patient generally developed yeast infections when on antibiotics.  Musculoskeletal:  Positive for arthralgias and myalgias.  Skin: Negative.   Allergic/Immunologic: Positive for environmental allergies.  Neurological:  Positive for headaches.  Hematological:  Positive for adenopathy.  Psychiatric/Behavioral: Negative.        Objective:    Physical Exam Vitals and nursing note reviewed.  Constitutional:      Appearance: Normal appearance. She is well-developed. She is obese. She is ill-appearing.  HENT:     Head: Normocephalic and atraumatic.     Right Ear: Tenderness present. Tympanic membrane is erythematous and bulging.     Left Ear: Tenderness present. Tympanic membrane is erythematous and bulging.     Nose: Congestion present.     Right Sinus: Maxillary sinus tenderness and frontal sinus tenderness present.     Left Sinus: Maxillary sinus tenderness and frontal sinus tenderness present.     Mouth/Throat:     Pharynx: Posterior oropharyngeal erythema present.  Eyes:     Pupils: Pupils are equal, round, and reactive to  light.  Cardiovascular:     Rate and Rhythm: Normal rate and regular rhythm.     Pulses:          Dorsalis pedis pulses are 3+ on the right side and 3+ on the left side.       Posterior tibial pulses are 3+ on the right side and 3+ on the left side.     Heart sounds: Normal heart sounds.  Pulmonary:     Effort: Pulmonary effort is normal.     Breath sounds: Wheezing present.     Comments:  Dry, harsh cough present in the office.  Mild wheezing auscultated Abdominal:     Palpations: Abdomen is soft.  Musculoskeletal:        General: Normal range of motion.     Cervical back: Normal range of motion and neck supple.     Right foot: Normal range of motion.     Left foot: Normal range of motion.  Feet:     Right foot:     Protective Sensation: 2 sites tested.  2 sites sensed.     Skin integrity: Skin integrity normal.     Toenail Condition: Right toenails are normal.     Left foot:     Protective Sensation: 2 sites tested.  2 sites sensed.     Skin integrity: Skin integrity normal.     Toenail Condition: Left toenails are normal.  Lymphadenopathy:     Cervical: Cervical adenopathy present.  Skin:    General: Skin is warm and dry.     Capillary Refill: Capillary refill takes less than 2 seconds.  Neurological:     General: No focal deficit present.     Mental Status: She is alert and oriented to person, place, and time.  Psychiatric:        Mood and Affect: Mood normal.        Behavior: Behavior normal.        Thought Content: Thought content normal.        Judgment: Judgment normal.    Today's Vitals   06/23/21 0931  BP: 125/83  Pulse: 81  Temp: 98.2 F (36.8 C)  SpO2: 96%  Weight: 244 lb 6.4 oz (110.9 kg)  Height: 5\' 4"  (1.626 m)   Body mass index is 41.95 kg/m.   Wt Readings from Last 3 Encounters:  06/23/21 244 lb 6.4 oz (110.9 kg)  03/22/21 250 lb 14.4 oz (113.8 kg)  03/13/21 240 lb 3.2 oz (109 kg)    Health Maintenance Due  Topic Date Due   Pneumococcal  Vaccine 85-71 Years old (1 - PCV) Never done   Zoster Vaccines- Shingrix (1 of 2) Never done   COVID-19 Vaccine (3 - Booster for Janssen series) 01/30/2021    There are no preventive care reminders to display for this patient.   Lab Results  Component Value Date   TSH 3.680 07/28/2020   Lab Results  Component Value Date   WBC 6.7 07/28/2020   HGB 13.9 07/28/2020   HCT 43.5 07/28/2020   MCV 85 07/28/2020   PLT 287 07/28/2020   Lab Results  Component Value Date   NA 139 07/28/2020   K 4.4 07/28/2020   CO2 22 07/28/2020   GLUCOSE 132 (H) 07/28/2020   BUN 12 07/28/2020   CREATININE 0.75 07/28/2020   BILITOT 0.2 07/28/2020   ALKPHOS 128 (H) 07/28/2020   AST 15 07/28/2020   ALT 18 07/28/2020   PROT 6.5 07/28/2020   ALBUMIN 4.4 07/28/2020   CALCIUM 9.1 07/28/2020   ANIONGAP 9 12/25/2012   Lab Results  Component Value Date   CHOL 181 07/28/2020   Lab Results  Component Value Date   HDL 50 07/28/2020   Lab Results  Component Value Date   LDLCALC 106 (H) 07/28/2020   Lab Results  Component Value Date   TRIG 139 07/28/2020   No results found for: [facility] Lab Results  Component Value Date   HGBA1C 6.1 (H) 07/28/2020       Assessment & Plan:  1. Recurrent pansinusitis Start  Augmentin twice daily for next 10 days.Rest and increase fluids. Continue using OTC medication to control symptoms.   - amoxicillin-clavulanate (AUGMENTIN) 875-125 MG tablet; Take 1 tablet by mouth 2 (two) times daily.  Dispense: 20 tablet; Refill: 0  2. Mild intermittent asthma with acute exacerbation Start prednisone taper.  Take as directed for 12 days.  Use rescue inhaler as needed and as indicated for wheezing and shortness of breath. - predniSONE (STERAPRED UNI-PAK 48 TAB) 10 MG (48) TBPK tablet; 12 day taper - take by mouth as directed for 12 days  Dispense: 48 tablet; Refill: 0  3. Cough May take Tussionex twice daily as needed for cough. Advised patient not to overuse this medicine  and not to mix with other medications or alcohol as it can cause respiratory distress, sleepiness or dizziness. Should also avoid driving. Patient voiced understanding and agreement.   - chlorpheniramine-HYDROcodone (TUSSIONEX PENNKINETIC ER) 10-8 MG/5ML SUER; Take 5 mLs by mouth every 12 (twelve) hours as needed for cough.  Dispense: 115 mL; Refill: 0  4. Vaginal yeast infection May take Diflucan 150 mg tablet yeast infection develops.  May repeat dose if needed for persistent symptoms in 3 days. - fluconazole (DIFLUCAN) 150 MG tablet; Take 1 tablet po once. May repeat dose in 3 days as needed for persistent symptoms.  Dispense: 3 tablet; Refill: 0   Problem List Items Addressed This Visit       Respiratory   Recurrent pansinusitis - Primary   Relevant Medications   predniSONE (STERAPRED UNI-PAK 48 TAB) 10 MG (48) TBPK tablet   amoxicillin-clavulanate (AUGMENTIN) 875-125 MG tablet   chlorpheniramine-HYDROcodone (TUSSIONEX PENNKINETIC ER) 10-8 MG/5ML SUER   fluconazole (DIFLUCAN) 150 MG tablet   Mild intermittent asthma with acute exacerbation   Relevant Medications   predniSONE (STERAPRED UNI-PAK 48 TAB) 10 MG (48) TBPK tablet     Genitourinary   Vaginal yeast infection   Relevant Medications   fluconazole (DIFLUCAN) 150 MG tablet     Other   Cough   Relevant Medications   chlorpheniramine-HYDROcodone (TUSSIONEX PENNKINETIC ER) 10-8 MG/5ML SUER     Meds ordered this encounter  Medications   predniSONE (STERAPRED UNI-PAK 48 TAB) 10 MG (48) TBPK tablet    Sig: 12 day taper - take by mouth as directed for 12 days    Dispense:  48 tablet    Refill:  0    Order Specific Question:   Supervising Provider    Answer:   Beatrice Lecher D [[id number]]   amoxicillin-clavulanate (AUGMENTIN) 875-125 MG tablet    Sig: Take 1 tablet by mouth 2 (two) times daily.    Dispense:  20 tablet    Refill:  0    Order Specific Question:   Supervising Provider    Answer:   Beatrice Lecher D  [[id number]]   chlorpheniramine-HYDROcodone (TUSSIONEX PENNKINETIC ER) 10-8 MG/5ML SUER    Sig: Take 5 mLs by mouth every 12 (twelve) hours as needed for cough.    Dispense:  115 mL    Refill:  0    Order Specific Question:   Supervising Provider    Answer:   Beatrice Lecher D [[id number]]   fluconazole (DIFLUCAN) 150 MG tablet    Sig: Take 1 tablet po once. May repeat dose in 3 days as needed for persistent symptoms.    Dispense:  3 tablet    Refill:  0    Order Specific Question:   Supervising Provider    Answer:   Beatrice Lecher  D [[id number]]     Ronnell Freshwater, NP

## 2021-07-02 DIAGNOSIS — B373 Candidiasis of vulva and vagina: Secondary | ICD-10-CM | POA: Insufficient documentation

## 2021-07-02 DIAGNOSIS — B3731 Acute candidiasis of vulva and vagina: Secondary | ICD-10-CM | POA: Insufficient documentation

## 2021-07-03 ENCOUNTER — Other Ambulatory Visit: Payer: Self-pay | Admitting: Nurse Practitioner

## 2021-07-03 ENCOUNTER — Encounter: Payer: Self-pay | Admitting: Nurse Practitioner

## 2021-07-03 DIAGNOSIS — J0141 Acute recurrent pansinusitis: Secondary | ICD-10-CM

## 2021-07-03 MED ORDER — LEVOFLOXACIN 500 MG PO TABS: 500.0000 mg | ORAL_TABLET | Freq: Every day | ORAL | 0 refills | Status: DC

## 2021-07-03 NOTE — Progress Notes (Signed)
Recurrent symptoms of sinusitis. Has competed course of Z-pack and augmentin along with prednisone taper. Trial of levaquin 500mg  daily for 10 days. Currently negative for COVID 19. Sent rx to [facility].

## 2021-07-08 DIAGNOSIS — G4733 Obstructive sleep apnea (adult) (pediatric): Secondary | ICD-10-CM | POA: Diagnosis not present

## 2021-08-09 DIAGNOSIS — G4733 Obstructive sleep apnea (adult) (pediatric): Secondary | ICD-10-CM | POA: Diagnosis not present

## 2021-08-10 DIAGNOSIS — G4733 Obstructive sleep apnea (adult) (pediatric): Secondary | ICD-10-CM | POA: Diagnosis not present

## 2021-08-12 ENCOUNTER — Other Ambulatory Visit: Payer: Self-pay | Admitting: Nurse Practitioner

## 2021-08-12 DIAGNOSIS — F411 Generalized anxiety disorder: Secondary | ICD-10-CM

## 2021-08-12 DIAGNOSIS — F5101 Primary insomnia: Secondary | ICD-10-CM

## 2021-08-13 NOTE — Telephone Encounter (Signed)
Her appointment is scheduled prior to when refills are needed. If not, we should move up her appointment. I plan to have her to sign controlled substances agreement so it is on file.

## 2021-08-14 NOTE — Telephone Encounter (Signed)
Sent new prescriptions for #30 tablets with no refills to last until patient is seen in office for further evaluation.

## 2021-08-18 ENCOUNTER — Other Ambulatory Visit: Payer: Self-pay | Admitting: Nurse Practitioner

## 2021-08-18 DIAGNOSIS — F5101 Primary insomnia: Secondary | ICD-10-CM

## 2021-08-18 DIAGNOSIS — F411 Generalized anxiety disorder: Secondary | ICD-10-CM

## 2021-08-23 ENCOUNTER — Telehealth: Payer: Self-pay | Admitting: Internal Medicine

## 2021-08-23 NOTE — Telephone Encounter (Signed)
Spoke to patient and requested that she bring SD card.

## 2021-08-24 ENCOUNTER — Encounter: Payer: Self-pay | Admitting: Internal Medicine

## 2021-08-24 ENCOUNTER — Ambulatory Visit: Payer: Medicare Other | Admitting: Internal Medicine

## 2021-08-24 ENCOUNTER — Other Ambulatory Visit: Payer: Self-pay

## 2021-08-24 VITALS — BP 122/82 | HR 95 | Temp 98.4°F | Ht 64.0 in | Wt 242.0 lb

## 2021-08-24 DIAGNOSIS — J4520 Mild intermittent asthma, uncomplicated: Secondary | ICD-10-CM | POA: Diagnosis not present

## 2021-08-24 DIAGNOSIS — G4733 Obstructive sleep apnea (adult) (pediatric): Secondary | ICD-10-CM | POA: Diagnosis not present

## 2021-08-24 NOTE — Progress Notes (Signed)
Name: Janet Austin MRN: [id number] DOB: [birth year]     CONSULTATION DATE: 08/24/2021  REFERRING MD : Humphrey Rolls  CHIEF COMPLAINT: SOB  SYNOPSIS 60 year old female never smoker followed for mild intermittent asthma, pulmonary nodules (serial follow up , resolved on CT chest 12/2016)  Medical history significant for breast cancer 2010 s/p bilateral mastectomy ,and chemo .      TEST/EVENTS :  Pulmonary function testing done July 2018 showed no airflow obstruction with FEV1 at 81%, ratio 86, FVC 77% no significant bronchodilator response, DLCO 52%, mid flow obstruction and reversibility noted  CXR 10/2016 clear lungs     08/24/2020  Follow up : Asthma Patient returns for a follow-up visit.  She was last seen March 2019. Patient says overall breathing has not been doing as well with increased cough , throat clearing, dry cough. Wheezing , shortness of breath . Has been going on since last visit  but getting more frequent in last year.  She is currently using ventolin 3-4 times a day . Has nasal stuffiness and drainage.  Had childhood asthma. Never been on a maintenance inhaler.  No GERD.   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    Last visit patient had complained of some daytime sleepiness and snoring with [place] at  14.  She was recommended for a sleep study however patient says she was unable to go due to family issues. Husband is sick and son ran away from home.  Symptoms are worse. Complains of increased daytime sleepiness, snoring , wakes self up gasping for air several times a night . Mouth dryness. Wakes up tired. Takes Ambien for sleep, can not go to sleep without it .  Rare caffeine. No signs of cataplexy or sleep paralysis .  Sleeps on average 4hr each night . Up with husband a lot during night .    Never smoker, has dog but for long time.  diagnosed with obstructive sleep apnea Sleep study documents and reports reviewed in detail with the patient She snored 415  times AHI was 20 She desatted 134 times Lowest O2 sat was 75%    CC Follow up asthma Follow-up OSA    HPI Asthma seems to be under control and stable with Symbicort No exacerbation at this time No evidence of heart failure at this time No evidence or signs of infection at this time No respiratory distress No fevers, chills, nausea, vomiting, diarrhea No evidence of lower extremity edema No evidence hemoptysis   FOLLOW UP OSA EXCELLENT COMPLIANCE REPORT 91% and 99% days AHI reduced to 0.8    PAST MEDICAL HISTORY :   has a past medical history of Anxiety, Arthritis, Asthma, Cancer ([place]), Headache, Hyperlipidemia, PONV (postoperative nausea and vomiting), and Sleep apnea.  has a past surgical history that includes Abdominal hysterectomy; Breast surgery; Breast surgery; Mastectomy; Knee arthroscopy (Right); and Colonoscopy with propofol (N/A, 05/29/2016). Prior to Admission medications   Medication Sig Start Date End Date Taking? Authorizing Provider  albuterol (VENTOLIN HFA) 108 (90 Base) MCG/ACT inhaler Inhale 2 puffs into the lungs every 6 (six) hours as needed for wheezing or shortness of breath. 02/12/20  Yes Boscia, Heather E, NP  ALPRAZolam (XANAX) 1 MG tablet Take 1 tablet (1 mg total) by mouth at bedtime as needed for anxiety. 11/09/20  Yes Boscia, Greer Ee, NP  atorvastatin (LIPITOR) 10 MG tablet Take 1 tablet (10 mg total) by mouth daily. 11/09/20  Yes Ronnell Freshwater, NP  budesonide-formoterol (SYMBICORT) 160-4.5 MCG/ACT  inhaler Inhale 2 puffs into the lungs 2 (two) times daily. 08/24/20  Yes Parrett, Tammy S, NP  clobetasol ointment (TEMOVATE) [id number] % Apply 1 application topically daily as needed. 11/03/18  Yes Scarboro, Audie Clear, NP  cyclobenzaprine (FLEXERIL) 5 MG tablet Take 5 mg by mouth 3 (three) times daily as needed for muscle spasms.   Yes [provider]  triamcinolone ointment (KENALOG) 0.5 % Apply 1 application topically 2 (two) times daily. 11/03/18   Yes Scarboro, Audie Clear, NP  valACYclovir (VALTREX) 1000 MG tablet Take 1 tablet (1,000 mg total) by mouth 2 (two) times daily. 02/12/20  Yes Boscia, Greer Ee, NP  zolpidem (AMBIEN) 10 MG tablet Take 1 tablet (10 mg total) by mouth at bedtime as needed for sleep. 11/09/20  Yes Ronnell Freshwater, NP   Allergies  Allergen Reactions   Other Itching and Rash   Asa [Aspirin]    Gabapentin Other (See Comments)    Eye twitch   Lyrica [Pregabalin] Other (See Comments)    Altered mental status   Tape Rash    Patient states she had blisters and it was very itchy, ok to use paper tape and tegaderm       Review of Systems:  Gen:  Denies  fever, sweats, chills weight loss  HEENT: Denies blurred vision, double vision, ear pain, eye pain, hearing loss, nose bleeds, sore throat Cardiac:  No dizziness, chest pain or heaviness, chest tightness,edema, No JVD Resp:   No cough, -sputum production, -shortness of breath,-wheezing, -hemoptysis,  Gi: Denies swallowing difficulty, stomach pain, nausea or vomiting, diarrhea, constipation, Other:  All other systems negative    BP 122/82 (BP Location: Left Arm, Patient Position: Sitting, Cuff Size: Normal)   Pulse 95   Temp 98.4 F (36.9 C) (Oral)   Ht '5\' 4"'$  (1.626 m)   Wt 242 lb (109.8 kg)   SpO2 95%   BMI 41.54 kg/m    Physical Examination:   General Appearance: No distress  EYES PERRLA, EOM intact.   NECK Supple, No JVD Pulmonary: normal breath sounds, No wheezing.  CardiovascularNormal S1,S2.  No m/r/g.   Abdomen: Benign, Soft, non-tender. Skin:   warm, no rashes, no ecchymosis  Extremities: normal, no cyanosis, clubbing. Neuro:without focal findings,  speech normal  PSYCHIATRIC: Mood, affect within normal limits.   ALL OTHER ROS ARE NEGATIVE    ASSESSMENT AND PLAN SYNOPSIS   60 year old obese white female seen today for follow-up assessment for asthma and OSA in the setting of obesity and deconditioned state,   Intermittent  asthma without complications Patient has had 2 exacerbations in the last 6 months January 2022 diagnosed with COVID responded well to antibiotics and steroids Patient currently on inhaled corticosteroids and long-acting beta agonist with Symbicort as maintenance inhaler Avoid triggers Recommend weight loss No indication for prednisone or antibiotics at this time We will try decreasing Symbicort to 1 puff twice daily  Diagnosis of obstructive sleep apnea AHI of 20 diagnosis moderate OSA Patient was previously started on auto CPAP 5-12 Patient with excellent compliance report from March to July 2022 91% compliance for greater than 4 hours 99% compliance for days Her AHI is reduced to 0.8 previously AHI was 20   Obesity -recommend significant weight loss -recommend changing diet Current weight 242 goal weight 220 next year   Deconditioned state -Recommend increased daily activity and exercise       COVID-19 EDUCATION: The signs and symptoms of COVID-19 were discussed with the patient and how  to seek care for testing.  The importance of social distancing was discussed today. Hand Washing Techniques and avoid touching face was advised.     MEDICATION ADJUSTMENTS/LABS AND TESTS ORDERED: Continue inhalers as prescribed  Decrease Symbicort to 1 puff twice daily we will try and assess respiratory status  avoid allergens   avoid secondhand smoke Continue CPAP as prescribed    CURRENT MEDICATIONS REVIEWED AT LENGTH WITH PATIENT TODAY   Patient satisfied with Plan of action and management. All questions answered  Follow up in 6 months  Total Time Spent 32 mins    Maretta Bees Patricia Pesa, M.D.  [facility]  Medical Director [place] Director [facility] Department

## 2021-08-24 NOTE — Patient Instructions (Addendum)
Continue inhalers as prescribed   avoid allergens   avoid secondhand smoke  Continue CPAP as prescribed  A+ GREAT JOB!!!

## 2021-08-29 ENCOUNTER — Ambulatory Visit (INDEPENDENT_AMBULATORY_CARE_PROVIDER_SITE_OTHER): Payer: Medicare Other | Admitting: Nurse Practitioner

## 2021-08-29 ENCOUNTER — Encounter: Payer: Self-pay | Admitting: Nurse Practitioner

## 2021-08-29 ENCOUNTER — Other Ambulatory Visit: Payer: Self-pay

## 2021-08-29 VITALS — BP 116/78 | HR 80 | Temp 98.4°F | Ht 64.0 in | Wt 243.1 lb

## 2021-08-29 DIAGNOSIS — Z0000 Encounter for general adult medical examination without abnormal findings: Secondary | ICD-10-CM | POA: Insufficient documentation

## 2021-08-29 DIAGNOSIS — F5101 Primary insomnia: Secondary | ICD-10-CM | POA: Diagnosis not present

## 2021-08-29 DIAGNOSIS — Z23 Encounter for immunization: Secondary | ICD-10-CM | POA: Insufficient documentation

## 2021-08-29 DIAGNOSIS — F411 Generalized anxiety disorder: Secondary | ICD-10-CM

## 2021-08-29 MED ORDER — ZOLPIDEM TARTRATE 10 MG PO TABS: 10.0000 mg | ORAL_TABLET | Freq: Every evening | ORAL | 1 refills | Status: DC | PRN

## 2021-08-29 MED ORDER — ALPRAZOLAM 1 MG PO TABS: ORAL_TABLET | ORAL | 1 refills | Status: DC

## 2021-08-29 NOTE — Progress Notes (Signed)
Subjective:   Janet Austin is a 60 y.o. female who presents for Medicare Annual (Subsequent) preventive examination. Patient reports no new concerns or complaints today.  Has had some family related stress but is managing it through coping mechanisms.  She does have alprazolam 1 mg of which she takes 1/2 to 1 tablet daily when needed she is due for refill of this today.  She is also due to have a new prescription for her zolpidem 10 mg tablets which she takes at night when needed for insomnia.  We reviewed the risks and potential side effects of taking these medications long-term.  She voiced understanding and awareness of signs.  She would like to continue medications as needed and as prescribed.  Updated her pharmacy information.  She signed a controlled substances agreement plan for primary care at [facility].  Her PDMP profile was reviewed today her overdose was score is 260.  Her fill history is appropriate.  Both alprazolam and zolpidem refilled last on 05/18/2021. She would like to have tetanus vaccine while in the office today.  She plans to get flu vaccine from her pharmacy.  She does see a pulmonologist due to obstructive sleep apnea.  She reports sleeping much better and doing well with CPAP.  She has completed her COVID-19 vaccine series.  Review of Systems    Review of Systems  Constitutional:  Negative for activity change, appetite change, chills, fatigue and fever.  HENT:  Negative for congestion, postnasal drip, rhinorrhea, sinus pressure, sinus pain, sneezing and sore throat.   Eyes: Negative.   Respiratory:  Negative for cough, chest tightness, shortness of breath and wheezing.   Cardiovascular:  Negative for chest pain and palpitations.  Gastrointestinal:  Negative for abdominal pain, constipation, diarrhea, nausea and vomiting.  Endocrine: Negative for cold intolerance, heat intolerance, polydipsia and polyuria.  Genitourinary:  Negative for dyspareunia, dysuria, flank pain,  frequency and urgency.  Musculoskeletal:  Negative for arthralgias, back pain and myalgias.  Skin:  Negative for rash.  Allergic/Immunologic: Positive for environmental allergies.  Neurological:  Negative for dizziness, weakness and headaches.  Hematological:  Negative for adenopathy.  Psychiatric/Behavioral:  Positive for sleep disturbance. The patient is nervous/anxious.         Objective:   Physical Exam Vitals and nursing note reviewed.  Constitutional:      Appearance: Normal appearance. She is well-developed. She is obese.  HENT:     Head: Normocephalic and atraumatic.     Right Ear: Tympanic membrane, ear canal and external ear normal.     Left Ear: Tympanic membrane, ear canal and external ear normal.     Nose: Nose normal.     Mouth/Throat:     Mouth: Mucous membranes are moist.     Pharynx: Oropharynx is clear.  Eyes:     Extraocular Movements: Extraocular movements intact.     Conjunctiva/sclera: Conjunctivae normal.     Pupils: Pupils are equal, round, and reactive to light.  Neck:     Vascular: No carotid bruit.  Cardiovascular:     Rate and Rhythm: Normal rate and regular rhythm.     Pulses: Normal pulses.     Heart sounds: Normal heart sounds.  Pulmonary:     Effort: Pulmonary effort is normal.     Breath sounds: Normal breath sounds.  Abdominal:     General: Bowel sounds are normal.     Palpations: Abdomen is soft.     Tenderness: There is no guarding.  Musculoskeletal:  General: Normal range of motion.     Cervical back: Normal range of motion and neck supple.  Lymphadenopathy:     Cervical: No cervical adenopathy.  Skin:    General: Skin is warm and dry.     Capillary Refill: Capillary refill takes less than 2 seconds.  Neurological:     General: No focal deficit present.     Mental Status: She is alert and oriented to person, place, and time.  Psychiatric:        Mood and Affect: Mood normal.        Behavior: Behavior normal.         Thought Content: Thought content normal.        Judgment: Judgment normal.  Today's Vitals   08/29/21 1047  BP: 116/78  Pulse: 80  Temp: 98.4 F (36.9 C)  SpO2: 96%  Weight: 243 lb 1.9 oz (110.3 kg)  Height: '5\' 4"'$  (1.626 m)   Body mass index is 41.73 kg/m.   No flowsheet data found.  Current Medications (verified) Outpatient Encounter Medications as of 08/29/2021  Medication Sig   albuterol (VENTOLIN HFA) 108 (90 Base) MCG/ACT inhaler Inhale 2 puffs into the lungs every 6 (six) hours as needed for wheezing or shortness of breath.   ALPRAZolam (XANAX) 1 MG tablet Take 1/2 to 1 tablet po QD prn anxiety   atorvastatin (LIPITOR) 10 MG tablet Take 1 tablet (10 mg total) by mouth daily.   budesonide-formoterol (SYMBICORT) 160-4.5 MCG/ACT inhaler Inhale 2 puffs into the lungs 2 (two) times daily.   clobetasol ointment (TEMOVATE) AB-123456789 % Apply 1 application topically daily as needed.   cyclobenzaprine (FLEXERIL) 5 MG tablet Take 5 mg by mouth 3 (three) times daily as needed for muscle spasms.   triamcinolone ointment (KENALOG) 0.5 % Apply 1 application topically 2 (two) times daily.   valACYclovir (VALTREX) 1000 MG tablet Take 1 tablet (1,000 mg total) by mouth 2 (two) times daily.   zolpidem (AMBIEN) 10 MG tablet Take 1 tablet (10 mg total) by mouth at bedtime as needed. for sleep   [DISCONTINUED] ALPRAZolam (XANAX) 1 MG tablet TAKE ONE TABLET BY MOUTH AT BEDTIME AS NEEDED FOR ANXIETY   [DISCONTINUED] chlorpheniramine-HYDROcodone (TUSSIONEX PENNKINETIC ER) 10-8 MG/5ML SUER Take 5 mLs by mouth every 12 (twelve) hours as needed for cough.   [DISCONTINUED] fluconazole (DIFLUCAN) 150 MG tablet Take 1 tablet po once. May repeat dose in 3 days as needed for persistent symptoms.   [DISCONTINUED] levofloxacin (LEVAQUIN) 500 MG tablet Take 1 tablet (500 mg total) by mouth daily.   [DISCONTINUED] zolpidem (AMBIEN) 10 MG tablet TAKE ONE TABLET BY MOUTH AT BEDTIME AS NEEDED FOR SLEEP   No  facility-administered encounter medications on file as of 08/29/2021.    Allergies (verified) Other, Asa [aspirin], Gabapentin, Lyrica [pregabalin], and Tape   History: Past Medical History:  Diagnosis Date   Anxiety    Arthritis    Asthma    [employer]reesia 02/13/2021   Cancer ([place])    left breast   Headache    migraines   Hyperlipidemia    [employer]reesia 02/13/2021   PONV (postoperative nausea and vomiting)    Sleep apnea    Past Surgical History:  Procedure Laterality Date   ABDOMINAL HYSTERECTOMY     BREAST SURGERY     double mastectomy   BREAST SURGERY     reconstruction   COLONOSCOPY WITH PROPOFOL N/A 05/29/2016   Procedure: COLONOSCOPY WITH PROPOFOL;  Surgeon: Lollie Sails, MD;  Location:  [facility];  Service: Endoscopy;  Laterality: N/A;   KNEE ARTHROSCOPY Right    MASTECTOMY     Family History  Problem Relation Age of Onset   Hypertension Father    Social History   Socioeconomic History   Marital status: Married    Spouse name: Not on file   Number of children: Not on file   Years of education: Not on file   Highest education level: Not on file  Occupational History   Not on file  Tobacco Use   Smoking status: Never   Smokeless tobacco: Never  Vaping Use   Vaping Use: Never used  Substance and Sexual Activity   Alcohol use: No   Drug use: No   Sexual activity: Not on file  Other Topics Concern   Not on file  Social History Narrative   Not on file   Social Determinants of Health   Financial Resource Strain: Not on file  Food Insecurity: Not on file  Transportation Needs: Not on file  Physical Activity: Not on file  Stress: Not on file  Social Connections: Not on file    Tobacco Counseling Patient non smoker    Diabetic?no    Activities of Daily Living In your present state of health, do you have any difficulty performing the following activities: 08/29/2021 06/23/2021  Hearing? Tempie Donning  Vision? Y Y  Difficulty concentrating or making  decisions? N Y  Walking or climbing stairs? Y Y  Dressing or bathing? Y Y  Doing errands, shopping? N N  Preparing Food and eating ? - -  Using the Toilet? - -  In the past six months, have you accidently leaked urine? - -  Do you have problems with loss of bowel control? - -  Managing your Medications? - -  Managing your Finances? - -  Housekeeping or managing your Housekeeping? - -  Some recent data might be hidden    Patient Care Team: Ronnell Freshwater, NP as PCP - General (Family Medicine)  Indicate any recent Medical Services you may have received from other than [facility] providers in the past year (date may be approximate).     Assessment:  1. Encounter for Medicare annual wellness exam Annual Medicare wellness visit today.     2. Generalized anxiety disorder Patient managing family stress through coping mechanisms.  She may take alprazolam 0.5 mg 1 mg daily if needed for acute anxiety.  A prescription was sent to her pharmacy today.  She did sign a controlled substances agreement through primary care at [facility] today.  Today is 08/29/2021. - ALPRAZolam (XANAX) 1 MG tablet; Take 1/2 to 1 tablet po QD prn anxiety  Dispense: 90 tablet; Refill: 1   3. Primary insomnia Patient may take Ambien 10 mg tablets at night if needed for acute insomnia.She did sign a controlled substances agreement through primary care at [facility] today.  Today is 08/29/2021. - zolpidem (AMBIEN) 10 MG tablet; Take 1 tablet (10 mg total) by mouth at bedtime as needed. for sleep  Dispense: 90 tablet; Refill: 1   4. Need for Tdap vaccination Tdap vaccination administered during today's visit. - Tdap vaccine greater than or equal to 7yo IM   Hearing/Vision screen No results found.  Dietary issues and exercise activities discussed:     Goals Addressed   None   Depression Screen PHQ 2/9 Scores 08/29/2021 06/23/2021 03/22/2021 03/13/2021 02/14/2021 11/09/2020 06/14/2020  PHQ - 2 Score 2 0 '3 1 2 '$ 0 0  PHQ- 9  Score '6 3 13 7 11 '$ - -    Fall Risk Fall Risk  08/29/2021 06/23/2021 03/22/2021 03/13/2021 11/09/2020  Falls in the past year? '1 1 1 1 1  '$ Number falls in past yr: 0 0 0 - 1  Injury with Fall? 0 0 0 - 0  Follow up Falls evaluation completed Falls evaluation completed Falls evaluation completed Falls evaluation completed Falls evaluation completed    [place]:  Any stairs in or around the home? Yes  If so, are there any without handrails? Yes  Home free of loose throw rugs in walkways, pet beds, electrical cords, etc? Yes  Adequate lighting in your home to reduce risk of falls? Yes   ASSISTIVE DEVICES UTILIZED TO PREVENT FALLS:  Life alert? No  Use of a cane, walker or w/c? No  Grab bars in the bathroom? Yes  Shower chair or bench in shower? Yes  Elevated toilet seat or a handicapped toilet? Yes   TIMED UP AND GO:  Was the test performed? Yes .  Length of time to ambulate 10 feet: 30 sec.   Gait steady and fast without use of assistive device  Cognitive Function: MMSE - Mini Mental State Exam 11/09/2020 08/03/2019  Orientation to time 5 5  Orientation to Place 5 5  Registration 3 3  Attention/ Calculation 5 5  Recall 3 3  Language- name 2 objects 2 2  Language- repeat 1 1  Language- follow 3 step command 3 3  Language- read & follow direction 1 1  Write a sentence 1 1  Copy design 1 1  Total score 30 30     6CIT Screen 08/29/2021  What Year? 0 points  What month? 0 points  What time? 0 points  Count back from 20 0 points  Months in reverse 0 points  Repeat phrase 0 points  Total Score 0    Immunizations Immunization History  Administered Date(s) Administered   Influenza Inj Mdck Quad Pf 11/09/2020   Influenza,inj,Quad PF,6+ Mos 10/04/2017, 11/10/2018, 09/30/2019   Influenza-Unspecified 10/28/2017, 09/17/2019   Janssen (J&J) SARS-COV-2 Vaccination 03/30/2020   Moderna Sars-Covid-2 Vaccination 12/05/2020   Tdap 08/29/2021     TDAP status: Due, Education has been provided regarding the importance of this vaccine. Advised may receive this vaccine at local pharmacy or Health Dept. Aware to provide a copy of the vaccination record if obtained from local pharmacy or Health Dept. Verbalized acceptance and understanding.  Flu Vaccine status: Declined, Education has been provided regarding the importance of this vaccine but patient still declined. Advised may receive this vaccine at local pharmacy or Health Dept. Aware to provide a copy of the vaccination record if obtained from local pharmacy or Health Dept. Verbalized acceptance and understanding.  Pneumococcal vaccine status: Declined,  Education has been provided regarding the importance of this vaccine but patient still declined. Advised may receive this vaccine at local pharmacy or Health Dept. Aware to provide a copy of the vaccination record if obtained from local pharmacy or Health Dept. Verbalized acceptance and understanding.   Covid-19 vaccine status: Completed vaccines  Qualifies for Shingles Vaccine? Yes   Zostavax completed No   Shingrix Completed?: No.    Education has been provided regarding the importance of this vaccine. Patient has been advised to call insurance company to determine out of pocket expense if they have not yet received this vaccine. Advised may also receive vaccine at local pharmacy or Health Dept.  Verbalized acceptance and understanding.  Screening Tests Health Maintenance  Topic Date Due   Zoster Vaccines- Shingrix (1 of 2) Never done   COVID-19 Vaccine (3 - Booster for Janssen series) 09/09/2021 (Originally 01/30/2021)   Hepatitis C Screening  11/15/2021 (Originally 01/18/1979)   HIV Screening  03/13/2022 (Originally [birth year]19/1976)   INFLUENZA VACCINE  03/16/2022 (Originally 07/17/2021)   Pneumococcal Vaccine 21-92 Years old (1 - PCV) 08/24/2022 (Originally 01/18/1967)   COLONOSCOPY (Pts 45-81yr Insurance coverage will need to be confirmed)   05/29/2026   TETANUS/TDAP  08/30/2031   HPV VACCINES  Aged Out   MAMMOGRAM  Discontinued   PAP SMEAR-Modifier  Discontinued    Health Maintenance  Health Maintenance Due  Topic Date Due   Zoster Vaccines- Shingrix (1 of 2) Never done    Colorectal cancer screening: Type of screening: Colonoscopy. Completed yes. Repeat every 5 years  Mammogram status: No longer required due to double mastectomy.  Bone Density status: Completed yes. Results reflect: Bone density results: OSTEOPENIA. Repeat every 2 years.  Lung Cancer Screening: (Low Dose CT Chest recommended if Age 60-80years, 30 pack-year currently smoking OR have quit w/in 15years.) does not qualify.   Lung Cancer Screening Referral: no   Additional Screening:  Hepatitis C Screening: does not qualify; Completed no   Vision Screening: Recommended annual ophthalmology exams for early detection of glaucoma and other disorders of the eye. Is the patient up to date with their annual eye exam?  Yes  Who is the provider or what is the name of the office in which the patient attends annual eye exams? [facility] If pt is not established with a provider, would they like to be referred to a provider to establish care? No .   Dental Screening: Recommended annual dental exams for proper oral hygiene  Community Resource Referral / Chronic Care Management: CRR required this visit?  No   CCM required this visit?  No      Plan:     I have personally reviewed and noted the following in the patient's chart:   Medical and social history Use of alcohol, tobacco or illicit drugs  Current medications and supplements including opioid prescriptions.  Functional ability and status Nutritional status Physical activity Advanced directives List of other physicians Hospitalizations, surgeries, and ER visits in previous 12 months Vitals Screenings to include cognitive, depression, and falls Referrals and appointments  In  addition, I have reviewed and discussed with patient certain preventive protocols, quality metrics, and best practice recommendations. A written personalized care plan for preventive services as well as general preventive health recommendations were provided to patient.   This note was dictated using [employer] analyst Rapid proofreading was performed to expedite the delivery of the information. Despite proofreading, phonetic errors will occur which are common with this voice recognition software. Please take this into consideration. If there are any concerns, please contact our office.    HLeretha Pol FNP-c    08/29/2021

## 2021-08-29 NOTE — Progress Notes (Signed)
Established Patient Office Visit  Subjective:  Patient ID: Janet Austin, female    DOB: 1961-08-07  Age: 60 y.o. MRN: [id number]  CC:  Chief Complaint  Patient presents with   Medicare Wellness    HPI Janet Austin presents for Medicare wellness visit.  Patient reports no new concerns or complaints today.  Has had some family related stress but is managing it through coping mechanisms.  She does have alprazolam 1 mg of which she takes 1/2 to 1 tablet daily when needed she is due for refill of this today.  She is also due to have a new prescription for her zolpidem 10 mg tablets which she takes at night when needed for insomnia.  We reviewed the risks and potential side effects of taking these medications long-term.  She voiced understanding and awareness of signs.  She would like to continue medications as needed and as prescribed.  Updated her pharmacy information.  She signed a controlled substances agreement plan for primary care at [facility].  Her PDMP profile was reviewed today her overdose was score is 260.  Her fill history is appropriate.  Both alprazolam and zolpidem refilled last on 05/18/2021. She would like to have tetanus vaccine while in the office today.  She plans to get flu vaccine from her pharmacy.  She does see a pulmonologist due to obstructive sleep apnea.  She reports sleeping much better and doing well with CPAP.  She has completed her COVID-19 vaccine series.  Past Medical History:  Diagnosis Date   Anxiety    Arthritis    Asthma    Phreesia 02/13/2021   Cancer ([facility])    left breast   Headache    migraines   Hyperlipidemia    Phreesia 02/13/2021   PONV (postoperative nausea and vomiting)    Sleep apnea     Past Surgical History:  Procedure Laterality Date   ABDOMINAL HYSTERECTOMY     BREAST SURGERY     double mastectomy   BREAST SURGERY     reconstruction   COLONOSCOPY WITH PROPOFOL N/A 05/29/2016   Procedure: COLONOSCOPY WITH PROPOFOL;  Surgeon:  Lollie Sails, MD;  Location: [facility] ENDOSCOPY;  Service: Endoscopy;  Laterality: N/A;   KNEE ARTHROSCOPY Right    MASTECTOMY      Family History  Problem Relation Age of Onset   Hypertension Father     Social History   Socioeconomic History   Marital status: Married    Spouse name: Not on file   Number of children: Not on file   Years of education: Not on file   Highest education level: Not on file  Occupational History   Not on file  Tobacco Use   Smoking status: Never   Smokeless tobacco: Never  Vaping Use   Vaping Use: Never used  Substance and Sexual Activity   Alcohol use: No   Drug use: No   Sexual activity: Not on file  Other Topics Concern   Not on file  Social History Narrative   Not on file   Social Determinants of Health   Financial Resource Strain: Not on file  Food Insecurity: Not on file  Transportation Needs: Not on file  Physical Activity: Not on file  Stress: Not on file  Social Connections: Not on file  Intimate Partner Violence: Not on file    Outpatient Medications Prior to Visit  Medication Sig Dispense Refill   albuterol (VENTOLIN HFA) 108 (90 Base) MCG/ACT inhaler Inhale 2  puffs into the lungs every 6 (six) hours as needed for wheezing or shortness of breath. 54 g 3   atorvastatin (LIPITOR) 10 MG tablet Take 1 tablet (10 mg total) by mouth daily. 90 tablet 3   budesonide-formoterol (SYMBICORT) 160-4.5 MCG/ACT inhaler Inhale 2 puffs into the lungs 2 (two) times daily. 1 each 5   clobetasol ointment (TEMOVATE) AB-123456789 % Apply 1 application topically daily as needed. 60 g 0   cyclobenzaprine (FLEXERIL) 5 MG tablet Take 5 mg by mouth 3 (three) times daily as needed for muscle spasms.     triamcinolone ointment (KENALOG) 0.5 % Apply 1 application topically 2 (two) times daily. 30 g 3   valACYclovir (VALTREX) 1000 MG tablet Take 1 tablet (1,000 mg total) by mouth 2 (two) times daily. 180 tablet 1   ALPRAZolam (XANAX) 1 MG tablet TAKE ONE TABLET  BY MOUTH AT BEDTIME AS NEEDED FOR ANXIETY 30 tablet 0   chlorpheniramine-HYDROcodone (TUSSIONEX PENNKINETIC ER) 10-8 MG/5ML SUER Take 5 mLs by mouth every 12 (twelve) hours as needed for cough. 115 mL 0   fluconazole (DIFLUCAN) 150 MG tablet Take 1 tablet po once. May repeat dose in 3 days as needed for persistent symptoms. 3 tablet 0   levofloxacin (LEVAQUIN) 500 MG tablet Take 1 tablet (500 mg total) by mouth daily. 10 tablet 0   zolpidem (AMBIEN) 10 MG tablet TAKE ONE TABLET BY MOUTH AT BEDTIME AS NEEDED FOR SLEEP 30 tablet 0   No facility-administered medications prior to visit.    Allergies  Allergen Reactions   Other Itching and Rash   Asa [Aspirin]    Gabapentin Other (See Comments)    Eye twitch   Lyrica [Pregabalin] Other (See Comments)    Altered mental status   Tape Rash    Patient states she had blisters and it was very itchy, ok to use paper tape and tegaderm     ROS Review of Systems  Constitutional:  Negative for activity change, appetite change, chills, fatigue and fever.  HENT:  Negative for congestion, postnasal drip, rhinorrhea, sinus pressure, sinus pain, sneezing and sore throat.   Eyes: Negative.   Respiratory:  Negative for cough, chest tightness, shortness of breath and wheezing.   Cardiovascular:  Negative for chest pain and palpitations.  Gastrointestinal:  Negative for abdominal pain, constipation, diarrhea, nausea and vomiting.  Endocrine: Negative for cold intolerance, heat intolerance, polydipsia and polyuria.  Genitourinary:  Negative for dyspareunia, dysuria, flank pain, frequency and urgency.  Musculoskeletal:  Negative for arthralgias, back pain and myalgias.  Skin:  Negative for rash.  Allergic/Immunologic: Positive for environmental allergies.  Neurological:  Negative for dizziness, weakness and headaches.  Hematological:  Negative for adenopathy.  Psychiatric/Behavioral:  Positive for sleep disturbance. The patient is nervous/anxious.       Objective:    Physical Exam Vitals and nursing note reviewed.  Constitutional:      Appearance: Normal appearance. She is well-developed. She is obese.  HENT:     Head: Normocephalic and atraumatic.     Right Ear: Tympanic membrane, ear canal and external ear normal.     Left Ear: Tympanic membrane, ear canal and external ear normal.     Nose: Nose normal.     Mouth/Throat:     Mouth: Mucous membranes are moist.     Pharynx: Oropharynx is clear.  Eyes:     Extraocular Movements: Extraocular movements intact.     Conjunctiva/sclera: Conjunctivae normal.     Pupils: Pupils are equal,  round, and reactive to light.  Neck:     Vascular: No carotid bruit.  Cardiovascular:     Rate and Rhythm: Normal rate and regular rhythm.     Pulses: Normal pulses.     Heart sounds: Normal heart sounds.  Pulmonary:     Effort: Pulmonary effort is normal.     Breath sounds: Normal breath sounds.  Abdominal:     General: Bowel sounds are normal.     Palpations: Abdomen is soft.     Tenderness: There is no guarding.  Musculoskeletal:        General: Normal range of motion.     Cervical back: Normal range of motion and neck supple.  Lymphadenopathy:     Cervical: No cervical adenopathy.  Skin:    General: Skin is warm and dry.     Capillary Refill: Capillary refill takes less than 2 seconds.  Neurological:     General: No focal deficit present.     Mental Status: She is alert and oriented to person, place, and time.  Psychiatric:        Mood and Affect: Mood normal.        Behavior: Behavior normal.        Thought Content: Thought content normal.        Judgment: Judgment normal.    Today's Vitals   08/29/21 1047  BP: 116/78  Pulse: 80  Temp: 98.4 F (36.9 C)  SpO2: 96%  Weight: 243 lb 1.9 oz (110.3 kg)  Height: '5\' 4"'$  (1.626 m)   Body mass index is 41.73 kg/m.   Wt Readings from Last 3 Encounters:  08/29/21 243 lb 1.9 oz (110.3 kg)  08/24/21 242 lb (109.8 kg)  06/23/21 244  lb 6.4 oz (110.9 kg)     Health Maintenance Due  Topic Date Due   Zoster Vaccines- Shingrix (1 of 2) Never done    There are no preventive care reminders to display for this patient.  Lab Results  Component Value Date   TSH 3.680 07/28/2020   Lab Results  Component Value Date   WBC 6.7 07/28/2020   HGB 13.9 07/28/2020   HCT 43.5 07/28/2020   MCV 85 07/28/2020   PLT 287 07/28/2020   Lab Results  Component Value Date   NA 139 07/28/2020   K 4.4 07/28/2020   CO2 22 07/28/2020   GLUCOSE 132 (H) 07/28/2020   BUN 12 07/28/2020   CREATININE 0.75 07/28/2020   BILITOT 0.2 07/28/2020   ALKPHOS 128 (H) 07/28/2020   AST 15 07/28/2020   ALT 18 07/28/2020   PROT 6.5 07/28/2020   ALBUMIN 4.4 07/28/2020   CALCIUM 9.1 07/28/2020   ANIONGAP 9 12/25/2012   Lab Results  Component Value Date   CHOL 181 07/28/2020   Lab Results  Component Value Date   HDL 50 07/28/2020   Lab Results  Component Value Date   LDLCALC 106 (H) 07/28/2020   Lab Results  Component Value Date   TRIG 139 07/28/2020   No results found for: [facility] Lab Results  Component Value Date   HGBA1C 6.1 (H) 07/28/2020      Assessment & Plan:  1. Encounter for Medicare annual wellness exam Annual Medicare wellness visit today.    2. Generalized anxiety disorder Patient managing family stress through coping mechanisms.  She may take alprazolam 0.5 mg 1 mg daily if needed for acute anxiety.  A prescription was sent to her pharmacy today.  She did sign a  controlled substances agreement through primary care at [facility] today.  Today is 08/29/2021. - ALPRAZolam (XANAX) 1 MG tablet; Take 1/2 to 1 tablet po QD prn anxiety  Dispense: 90 tablet; Refill: 1  3. Primary insomnia Patient may take Ambien 10 mg tablets at night if needed for acute insomnia.She did sign a controlled substances agreement through primary care at [facility] today.  Today is 08/29/2021. - zolpidem (AMBIEN) 10 MG tablet; Take 1 tablet (10  mg total) by mouth at bedtime as needed. for sleep  Dispense: 90 tablet; Refill: 1  4. Need for Tdap vaccination Tdap vaccination administered during today's visit. - Tdap vaccine greater than or equal to 7yo IM   Problem List Items Addressed This Visit       Other   Generalized anxiety disorder   Relevant Medications   ALPRAZolam (XANAX) 1 MG tablet   Primary insomnia   Relevant Medications   zolpidem (AMBIEN) 10 MG tablet   Encounter for Medicare annual wellness exam - Primary   Need for Tdap vaccination   Relevant Orders   Tdap vaccine greater than or equal to 7yo IM (Completed)    Meds ordered this encounter  Medications   ALPRAZolam (XANAX) 1 MG tablet    Sig: Take 1/2 to 1 tablet po QD prn anxiety    Dispense:  90 tablet    Refill:  1    Order Specific Question:   Supervising Provider    Answer:   Beatrice Lecher D [[id number]]   zolpidem (AMBIEN) 10 MG tablet    Sig: Take 1 tablet (10 mg total) by mouth at bedtime as needed. for sleep    Dispense:  90 tablet    Refill:  1    Order Specific Question:   Supervising Provider    Answer:   Beatrice Lecher D [[id number]]   This note was dictated using Dragon Voice Recognition Software. Rapid proofreading was performed to expedite the delivery of the information. Despite proofreading, phonetic errors will occur which are common with this voice recognition software. Please take this into consideration. If there are any concerns, please contact our office.    Follow-up: Return in about 5 months (around 01/29/2022) for mood.    Ronnell Freshwater, NP

## 2021-10-06 DIAGNOSIS — M25561 Pain in right knee: Secondary | ICD-10-CM | POA: Diagnosis not present

## 2021-10-06 DIAGNOSIS — M25562 Pain in left knee: Secondary | ICD-10-CM | POA: Diagnosis not present

## 2021-10-06 DIAGNOSIS — M1711 Unilateral primary osteoarthritis, right knee: Secondary | ICD-10-CM | POA: Diagnosis not present

## 2021-11-15 DIAGNOSIS — G4733 Obstructive sleep apnea (adult) (pediatric): Secondary | ICD-10-CM | POA: Diagnosis not present

## 2021-11-16 ENCOUNTER — Ambulatory Visit: Payer: Medicare Other | Admitting: Hospice and Palliative Medicine

## 2021-11-17 ENCOUNTER — Other Ambulatory Visit: Payer: Self-pay | Admitting: Adult Health

## 2021-11-24 DIAGNOSIS — C50912 Malignant neoplasm of unspecified site of left female breast: Secondary | ICD-10-CM | POA: Diagnosis not present

## 2021-11-24 DIAGNOSIS — Z9011 Acquired absence of right breast and nipple: Secondary | ICD-10-CM | POA: Diagnosis not present

## 2021-11-28 ENCOUNTER — Other Ambulatory Visit: Payer: Self-pay

## 2021-11-28 ENCOUNTER — Ambulatory Visit (INDEPENDENT_AMBULATORY_CARE_PROVIDER_SITE_OTHER): Payer: Medicare Other | Admitting: Nurse Practitioner

## 2021-11-28 VITALS — BP 118/81 | HR 77 | Temp 94.6°F | Ht 64.0 in | Wt 245.0 lb

## 2021-11-28 DIAGNOSIS — R051 Acute cough: Secondary | ICD-10-CM

## 2021-11-28 DIAGNOSIS — J4521 Mild intermittent asthma with (acute) exacerbation: Secondary | ICD-10-CM

## 2021-11-28 DIAGNOSIS — J029 Acute pharyngitis, unspecified: Secondary | ICD-10-CM

## 2021-11-28 LAB — POCT INFLUENZA A/B: Influenza A, POC: NEGATIVE

## 2021-11-28 MED ORDER — AMOXICILLIN-POT CLAVULANATE 875-125 MG PO TABS: 1.0000 | ORAL_TABLET | Freq: Two times a day (BID) | ORAL | 0 refills | Status: DC

## 2021-11-28 MED ORDER — METHYLPREDNISOLONE 4 MG PO TBPK: ORAL_TABLET | ORAL | 0 refills | Status: DC

## 2021-11-28 NOTE — Progress Notes (Signed)
Acute Office Visit  Subjective:    Patient ID: Janet [place], female    DOB: 1961-11-14, 60 y.o.   MRN: [id number]  Chief Complaint  Patient presents with   Cough    Patient states she has congestion, cough and body aches for 4 days and fever for 3 days.  Right ear feels full.  Patient denies sore throat.    Cough This is a new problem. The current episode started in the past 7 days. The problem has been gradually worsening. The problem occurs every few minutes. The cough is Productive of sputum. Associated symptoms include ear congestion, ear pain, a fever, headaches, myalgias, nasal congestion, postnasal drip, rhinorrhea, a sore throat and wheezing. Pertinent negatives include no chest pain, chills, rash or shortness of breath. Nothing aggravates the symptoms. She has tried rest, OTC cough suppressant and a beta-agonist inhaler for the symptoms. The treatment provided mild relief. Her past medical history is significant for asthma and environmental allergies.   Past Medical History:  Diagnosis Date   Anxiety    Arthritis    Asthma    Phreesia 02/13/2021   Cancer ([place])    left breast   Headache    migraines   Hyperlipidemia    Phreesia 02/13/2021   PONV (postoperative nausea and vomiting)    Sleep apnea     Past Surgical History:  Procedure Laterality Date   ABDOMINAL HYSTERECTOMY     BREAST SURGERY     double mastectomy   BREAST SURGERY     reconstruction   COLONOSCOPY WITH PROPOFOL N/A 05/29/2016   Procedure: COLONOSCOPY WITH PROPOFOL;  Surgeon: Lollie Sails, MD;  Location: [facility] ENDOSCOPY;  Service: Endoscopy;  Laterality: N/A;   KNEE ARTHROSCOPY Right    MASTECTOMY      Family History  Problem Relation Age of Onset   Hypertension Father     Social History   Socioeconomic History   Marital status: Married    Spouse name: Not on file   Number of children: Not on file   Years of education: Not on file   Highest education level: Not on file  Occupational  History   Not on file  Tobacco Use   Smoking status: Never   Smokeless tobacco: Never  Vaping Use   Vaping Use: Never used  Substance and Sexual Activity   Alcohol use: No   Drug use: No   Sexual activity: Not on file  Other Topics Concern   Not on file  Social History Narrative   Not on file   Social Determinants of Health   Financial Resource Strain: Not on file  Food Insecurity: Not on file  Transportation Needs: Not on file  Physical Activity: Not on file  Stress: Not on file  Social Connections: Not on file  Intimate Partner Violence: Not on file    Outpatient Medications Prior to Visit  Medication Sig Dispense Refill   albuterol (VENTOLIN HFA) 108 (90 Base) MCG/ACT inhaler Inhale 2 puffs into the lungs every 6 (six) hours as needed for wheezing or shortness of breath. 54 g 3   ALPRAZolam (XANAX) 1 MG tablet Take 1/2 to 1 tablet po QD prn anxiety 90 tablet 1   atorvastatin (LIPITOR) 10 MG tablet Take 1 tablet (10 mg total) by mouth daily. 90 tablet 3   clobetasol ointment (TEMOVATE) 7.62 % Apply 1 application topically daily as needed. 60 g 0   cyclobenzaprine (FLEXERIL) 5 MG tablet Take 5 mg by mouth 3 (  three) times daily as needed for muscle spasms.     SYMBICORT 160-4.5 MCG/ACT inhaler INHALE TWO PUFFS BY MOUTH TWICE A DAY 10.2 g 1   triamcinolone ointment (KENALOG) 0.5 % Apply 1 application topically 2 (two) times daily. 30 g 3   valACYclovir (VALTREX) 1000 MG tablet Take 1 tablet (1,000 mg total) by mouth 2 (two) times daily. 180 tablet 1   zolpidem (AMBIEN) 10 MG tablet Take 1 tablet (10 mg total) by mouth at bedtime as needed. for sleep 90 tablet 1   No facility-administered medications prior to visit.    Allergies  Allergen Reactions   Other Itching and Rash   Asa [Aspirin]    Gabapentin Other (See Comments)    Eye twitch   Lyrica [Pregabalin] Other (See Comments)    Altered mental status   Tape Rash    Patient states she had blisters and it was very  itchy, ok to use paper tape and tegaderm     Review of Systems  Constitutional:  Positive for fatigue and fever. Negative for activity change, appetite change and chills.       Patient states that she has been running a fever of 99.5 until this morning   HENT:  Positive for congestion, ear pain, postnasal drip, rhinorrhea and sore throat. Negative for sinus pressure, sinus pain and sneezing.   Eyes: Negative.   Respiratory:  Positive for cough and wheezing. Negative for chest tightness and shortness of breath.   Cardiovascular:  Negative for chest pain and palpitations.  Gastrointestinal:  Negative for abdominal pain, constipation, diarrhea, nausea and vomiting.  Endocrine: Negative for cold intolerance, heat intolerance, polydipsia and polyuria.  Genitourinary:  Negative for dyspareunia, dysuria, flank pain, frequency and urgency.  Musculoskeletal:  Positive for myalgias. Negative for arthralgias and back pain.  Skin:  Negative for rash.  Allergic/Immunologic: Positive for environmental allergies.  Neurological:  Positive for headaches. Negative for dizziness and weakness.  Hematological:  Negative for adenopathy.  Psychiatric/Behavioral:  The patient is not nervous/anxious.       Objective:    Physical Exam Vitals and nursing note reviewed.  Constitutional:      Appearance: Normal appearance. She is well-developed. She is ill-appearing.  HENT:     Head: Normocephalic.     Right Ear: Tympanic membrane is erythematous and bulging.     Left Ear: Tympanic membrane is erythematous and bulging.     Nose: Congestion present.     Right Sinus: Frontal sinus tenderness present.     Left Sinus: Frontal sinus tenderness present.     Mouth/Throat:     Pharynx: Posterior oropharyngeal erythema present.     Tonsils: 1+ on the right. 1+ on the left.  Eyes:     Pupils: Pupils are equal, round, and reactive to light.  Cardiovascular:     Rate and Rhythm: Normal rate and regular rhythm.      Pulses: Normal pulses.     Heart sounds: Normal heart sounds.  Pulmonary:     Effort: Pulmonary effort is normal.     Breath sounds: Normal breath sounds.     Comments: Dry, harsh cough noted during today's visit  Abdominal:     Palpations: Abdomen is soft.  Musculoskeletal:        General: Normal range of motion.     Cervical back: Normal range of motion and neck supple.  Lymphadenopathy:     Cervical: Cervical adenopathy present.  Skin:    General: Skin is warm  and dry.     Capillary Refill: Capillary refill takes less than 2 seconds.  Neurological:     General: No focal deficit present.     Mental Status: She is alert and oriented to person, place, and time.  Psychiatric:        Mood and Affect: Mood normal.        Behavior: Behavior normal.        Thought Content: Thought content normal.        Judgment: Judgment normal.    Today's Vitals   11/28/21 1407  BP: 118/81  Pulse: 77  Temp: (!) 94.6 F (34.8 C)  SpO2: 94%  Weight: 245 lb (111.1 kg)  Height: 5\' 4"  (1.626 m)   Body mass index is 42.05 kg/m.   Wt Readings from Last 3 Encounters:  11/28/21 245 lb (111.1 kg)  08/29/21 243 lb 1.9 oz (110.3 kg)  08/24/21 242 lb (109.8 kg)    Health Maintenance Due  Topic Date Due   Hepatitis C Screening  Never done   Zoster Vaccines- Shingrix (1 of 2) Never done   COVID-19 Vaccine (3 - Booster for Janssen series) 01/30/2021    There are no preventive care reminders to display for this patient.   Lab Results  Component Value Date   TSH 3.680 07/28/2020   Lab Results  Component Value Date   WBC 6.7 07/28/2020   HGB 13.9 07/28/2020   HCT 43.5 07/28/2020   MCV 85 07/28/2020   PLT 287 07/28/2020   Lab Results  Component Value Date   NA 139 07/28/2020   K 4.4 07/28/2020   CO2 22 07/28/2020   GLUCOSE 132 (H) 07/28/2020   BUN 12 07/28/2020   CREATININE 0.75 07/28/2020   BILITOT 0.2 07/28/2020   ALKPHOS 128 (H) 07/28/2020   AST 15 07/28/2020   ALT 18  07/28/2020   PROT 6.5 07/28/2020   ALBUMIN 4.4 07/28/2020   CALCIUM 9.1 07/28/2020   ANIONGAP 9 12/25/2012   Lab Results  Component Value Date   CHOL 181 07/28/2020   Lab Results  Component Value Date   HDL 50 07/28/2020   Lab Results  Component Value Date   LDLCALC 106 (H) 07/28/2020   Lab Results  Component Value Date   TRIG 139 07/28/2020   No results found for: [facility] Lab Results  Component Value Date   HGBA1C 6.1 (H) 07/28/2020       Assessment & Plan:  1. Acute pharyngitis, unspecified etiology Screening for flu is negative. Start augmentin 875mg  twice daily for 10 days. Rest and increase fluids. Continue using OTC medication to control symptoms.   - amoxicillin-clavulanate (AUGMENTIN) 875-125 MG tablet; Take 1 tablet by mouth 2 (two) times daily.  Dispense: 20 tablet; Refill: 0  2. Mild intermittent asthma with acute exacerbation Add medrol taper. Take as directed for 6 days. Continue to use inhalers and respiratry medications as previously instructed.  - methylPREDNISolone (MEDROL) 4 MG TBPK tablet; Take by mouth as directed for 6 days  Dispense: 21 tablet; Refill: 0  3. Acute cough Lu negative today. Treating for pharyngitis and acute asthma exacerbation.  - POCT Influenza A/B   Problem List Items Addressed This Visit       Respiratory   Mild intermittent asthma with acute exacerbation   Relevant Medications   methylPREDNISolone (MEDROL) 4 MG TBPK tablet   Acute pharyngitis - Primary   Relevant Medications   amoxicillin-clavulanate (AUGMENTIN) 875-125 MG tablet     Other  Cough   Relevant Orders   POCT Influenza A/B (Completed)     Meds ordered this encounter  Medications   amoxicillin-clavulanate (AUGMENTIN) 875-125 MG tablet    Sig: Take 1 tablet by mouth 2 (two) times daily.    Dispense:  20 tablet    Refill:  0    Order Specific Question:   Supervising Provider    Answer:   Beatrice Lecher D [[id number]]   methylPREDNISolone (MEDROL) 4  MG TBPK tablet    Sig: Take by mouth as directed for 6 days    Dispense:  21 tablet    Refill:  0    Order Specific Question:   Supervising Provider    Answer:   Beatrice Lecher D [[id number]]     Ronnell Freshwater, NP

## 2021-12-04 ENCOUNTER — Encounter: Payer: Self-pay | Admitting: Nurse Practitioner

## 2021-12-04 DIAGNOSIS — J029 Acute pharyngitis, unspecified: Secondary | ICD-10-CM | POA: Insufficient documentation

## 2021-12-12 DIAGNOSIS — Z9011 Acquired absence of right breast and nipple: Secondary | ICD-10-CM | POA: Diagnosis not present

## 2021-12-12 DIAGNOSIS — C50912 Malignant neoplasm of unspecified site of left female breast: Secondary | ICD-10-CM | POA: Diagnosis not present

## 2021-12-15 DIAGNOSIS — G4733 Obstructive sleep apnea (adult) (pediatric): Secondary | ICD-10-CM | POA: Diagnosis not present

## 2022-01-09 ENCOUNTER — Ambulatory Visit (INDEPENDENT_AMBULATORY_CARE_PROVIDER_SITE_OTHER): Payer: Medicare Other | Admitting: Nurse Practitioner

## 2022-01-09 ENCOUNTER — Encounter: Payer: Self-pay | Admitting: Nurse Practitioner

## 2022-01-09 ENCOUNTER — Other Ambulatory Visit: Payer: Self-pay

## 2022-01-09 VITALS — BP 113/75 | HR 89 | Temp 98.0°F | Ht 64.0 in | Wt 245.7 lb

## 2022-01-09 DIAGNOSIS — J4521 Mild intermittent asthma with (acute) exacerbation: Secondary | ICD-10-CM

## 2022-01-09 DIAGNOSIS — R059 Cough, unspecified: Secondary | ICD-10-CM

## 2022-01-09 DIAGNOSIS — T3695XA Adverse effect of unspecified systemic antibiotic, initial encounter: Secondary | ICD-10-CM

## 2022-01-09 DIAGNOSIS — J0141 Acute recurrent pansinusitis: Secondary | ICD-10-CM | POA: Diagnosis not present

## 2022-01-09 DIAGNOSIS — B379 Candidiasis, unspecified: Secondary | ICD-10-CM

## 2022-01-09 LAB — POCT INFLUENZA A/B
Influenza A, POC: NEGATIVE
Influenza B, POC: NEGATIVE

## 2022-01-09 MED ORDER — PREDNISONE 10 MG PO TABS: ORAL_TABLET | ORAL | 0 refills | Status: DC

## 2022-01-09 MED ORDER — FLUCONAZOLE 150 MG PO TABS: ORAL_TABLET | ORAL | 0 refills | Status: DC

## 2022-01-09 MED ORDER — AMOXICILLIN-POT CLAVULANATE 875-125 MG PO TABS: 1.0000 | ORAL_TABLET | Freq: Two times a day (BID) | ORAL | 0 refills | Status: DC

## 2022-01-09 NOTE — Progress Notes (Signed)
Acute Office Visit  Subjective:    Patient ID: Janet Austin, female    DOB: 17-Dec-1961, 61 y.o.   MRN: [id number]  Chief Complaint  Patient presents with   Nasal Congestion    The patient took two home tests for covid 19 yesterday and today. Both were negative.   URI  This is a new problem. The current episode started in the past 7 days. The problem has been gradually worsening. There has been no fever. Associated symptoms include chest pain, congestion, coughing, headaches, nausea, a plugged ear sensation, rhinorrhea, sinus pain, sneezing, a sore throat, swollen glands and wheezing. Pertinent negatives include no abdominal pain, diarrhea, dysuria, rash or vomiting. Treatments tried: patient taking OTC mucinex and delsym to help with cough. The treatment provided mild relief.    Past Medical History:  Diagnosis Date   Anxiety    Arthritis    Asthma    [employer]rees[employer] 02/13/2021   Cancer ([place])    left breast   Headache    migraines   Hyperlipidemia    [employer]rees[employer] 02/13/2021   PONV (postoperative nausea and vomiting)    Sleep apnea     Past Surgical History:  Procedure Laterality Date   ABDOMINAL HYSTERECTOMY     BREAST SURGERY     double mastectomy   BREAST SURGERY     reconstruction   COLONOSCOPY WITH PROPOFOL N/A 05/29/2016   Procedure: COLONOSCOPY WITH PROPOFOL;  Surgeon: Lollie Sails, MD;  Location: [facility] ENDOSCOPY;  Service: Endoscopy;  Laterality: N/A;   KNEE ARTHROSCOPY Right    MASTECTOMY      Family History  Problem Relation Age of Onset   Hypertension Father     Social History   Socioeconomic History   Marital status: Married    Spouse name: Not on file   Number of children: Not on file   Years of education: Not on file   Highest education level: Not on file  Occupational History   Not on file  Tobacco Use   Smoking status: Never   Smokeless tobacco: Never  Vaping Use   Vaping Use: Never used  Substance and Sexual Activity   Alcohol use: No    Drug use: No   Sexual activity: Not on file  Other Topics Concern   Not on file  Social History Narrative   Not on file   Social Determinants of Health   Financial Resource Strain: Not on file  Food Insecurity: Not on file  Transportation Needs: Not on file  Physical Activity: Not on file  Stress: Not on file  Social Connections: Not on file  Intimate Partner Violence: Not on file    Outpatient Medications Prior to Visit  Medication Sig Dispense Refill   albuterol (VENTOLIN HFA) 108 (90 Base) MCG/ACT inhaler Inhale 2 puffs into the lungs every 6 (six) hours as needed for wheezing or shortness of breath. 54 g 3   ALPRAZolam (XANAX) 1 MG tablet Take 1/2 to 1 tablet po QD prn anxiety 90 tablet 1   atorvastatin (LIPITOR) 10 MG tablet Take 1 tablet (10 mg total) by mouth daily. 90 tablet 3   clobetasol ointment (TEMOVATE) 6.71 % Apply 1 application topically daily as needed. 60 g 0   SYMBICORT 160-4.5 MCG/ACT inhaler INHALE TWO PUFFS BY MOUTH TWICE A DAY 10.2 g 1   triamcinolone ointment (KENALOG) 0.5 % Apply 1 application topically 2 (two) times daily. 30 g 3   valACYclovir (VALTREX) 1000 MG tablet Take 1 tablet (  1,000 mg total) by mouth 2 (two) times daily. 180 tablet 1   zolpidem (AMBIEN) 10 MG tablet Take 1 tablet (10 mg total) by mouth at bedtime as needed. for sleep 90 tablet 1   amoxicillin-clavulanate (AUGMENTIN) 875-125 MG tablet Take 1 tablet by mouth 2 (two) times daily. 20 tablet 0   cyclobenzaprine (FLEXERIL) 5 MG tablet Take 5 mg by mouth 3 (three) times daily as needed for muscle spasms.     methylPREDNISolone (MEDROL) 4 MG TBPK tablet Take by mouth as directed for 6 days 21 tablet 0   No facility-administered medications prior to visit.    Allergies  Allergen Reactions   Other Itching and Rash   Asa [Aspirin]    Gabapentin Other (See Comments)    Eye twitch   Lyrica [Pregabalin] Other (See Comments)    Altered mental status   Tape Rash    Patient states she had  blisters and it was very itchy, ok to use paper tape and tegaderm     Review of Systems  Constitutional:  Positive for activity change, appetite change and fatigue. Negative for chills and fever.  HENT:  Positive for congestion, rhinorrhea, sinus pressure, sinus pain, sneezing and sore throat. Negative for postnasal drip.   Eyes: Negative.   Respiratory:  Positive for cough and wheezing. Negative for chest tightness and shortness of breath.   Cardiovascular:  Positive for chest pain. Negative for palpitations.  Gastrointestinal:  Positive for nausea. Negative for abdominal pain, constipation, diarrhea and vomiting.  Endocrine: Negative for cold intolerance, heat intolerance, polydipsia and polyuria.  Genitourinary:  Negative for dyspareunia, dysuria, flank pain, frequency and urgency.  Musculoskeletal:  Positive for arthralgias. Negative for back pain and myalgias.  Skin:  Negative for rash.  Allergic/Immunologic: Positive for environmental allergies.  Neurological:  Positive for headaches. Negative for dizziness and weakness.  Hematological:  Negative for adenopathy.  Psychiatric/Behavioral:  The patient is not nervous/anxious.       Objective:    Physical Exam Vitals and nursing note reviewed.  Constitutional:      Appearance: Normal appearance. She is well-developed. She is ill-appearing.  HENT:     Head: Normocephalic and atraumatic.     Right Ear: No tenderness. Tympanic membrane is bulging. Tympanic membrane is not erythematous.     Left Ear: No tenderness. Tympanic membrane is bulging. Tympanic membrane is not erythematous.     Nose: Congestion present.     Right Sinus: Maxillary sinus tenderness and frontal sinus tenderness present.     Left Sinus: Maxillary sinus tenderness and frontal sinus tenderness present.     Mouth/Throat:     Pharynx: Posterior oropharyngeal erythema present.  Eyes:     Pupils: Pupils are equal, round, and reactive to light.  Cardiovascular:      Rate and Rhythm: Normal rate and regular rhythm.     Pulses: Normal pulses.     Heart sounds: Normal heart sounds.  Pulmonary:     Effort: Pulmonary effort is normal.     Breath sounds: Normal breath sounds. No wheezing or rhonchi.     Comments: Dry, harsh cough noted today.  Abdominal:     Palpations: Abdomen is soft.  Musculoskeletal:        General: Normal range of motion.     Cervical back: Normal range of motion and neck supple.  Lymphadenopathy:     Cervical: Cervical adenopathy present.  Skin:    General: Skin is warm and dry.  Capillary Refill: Capillary refill takes less than 2 seconds.  Neurological:     General: No focal deficit present.     Mental Status: She is alert and oriented to person, place, and time.  Psychiatric:        Mood and Affect: Mood normal.        Behavior: Behavior normal.        Thought Content: Thought content normal.        Judgment: Judgment normal.    Today's Vitals   01/09/22 1132  BP: 113/75  Pulse: 89  Temp: 98 F (36.7 C)  SpO2: 95%  Weight: 245 lb 11.2 oz (111.4 kg)  Height: 5\' 4"  (1.626 m)   Body mass index is 42.17 kg/m.   Wt Readings from Last 3 Encounters:  01/09/22 245 lb 11.2 oz (111.4 kg)  11/28/21 245 lb (111.1 kg)  08/29/21 243 lb 1.9 oz (110.3 kg)    Health Maintenance Due  Topic Date Due   Hepatitis C Screening  Never done   Zoster Vaccines- Shingrix (1 of 2) Never done   COVID-19 Vaccine (3 - Booster for Janssen series) 01/30/2021    There are no preventive care reminders to display for this patient.   Lab Results  Component Value Date   TSH 3.680 07/28/2020   Lab Results  Component Value Date   WBC 6.7 07/28/2020   HGB 13.9 07/28/2020   HCT 43.5 07/28/2020   MCV 85 07/28/2020   PLT 287 07/28/2020   Lab Results  Component Value Date   NA 139 07/28/2020   K 4.4 07/28/2020   CO2 22 07/28/2020   GLUCOSE 132 (H) 07/28/2020   BUN 12 07/28/2020   CREATININE 0.75 07/28/2020   BILITOT 0.2  07/28/2020   ALKPHOS 128 (H) 07/28/2020   AST 15 07/28/2020   ALT 18 07/28/2020   PROT 6.5 07/28/2020   ALBUMIN 4.4 07/28/2020   CALCIUM 9.1 07/28/2020   ANIONGAP 9 12/25/2012   Lab Results  Component Value Date   CHOL 181 07/28/2020   Lab Results  Component Value Date   HDL 50 07/28/2020   Lab Results  Component Value Date   LDLCALC 106 (H) 07/28/2020   Lab Results  Component Value Date   TRIG 139 07/28/2020   No results found for: [facility] Lab Results  Component Value Date   HGBA1C 6.1 (H) 07/28/2020       Assessment & Plan:  1. Acute recurrent pansinusitis Flu swab negative.  Start Augmentin twice daily for next 10 days. Rest and increase fluids. Continue using OTC medication to control symptoms.   - amoxicillin-clavulanate (AUGMENTIN) 875-125 MG tablet; Take 1 tablet by mouth 2 (two) times daily.  Dispense: 20 tablet; Refill: 0  2. Cough, unspecified type Flu swab negative. - POCT Influenza A/B  3. Mild intermittent asthma with acute exacerbation Add prednisone taper.  Take as directed for 9 days.  Use rescue inhaler as needed and as prescribed. - predniSONE (DELTASONE) 10 MG tablet; Take 1 tablet three times a day with a meal for three for three days, take 1 tablet by twice daily with a meal for 3 days, take 1 tablet once daily with a meal for 3 days  Dispense: 18 tablet; Refill: 0  4. Antibiotic-induced yeast infection Take Diflucan if yeast infection develops.  May repeat dose in 3 days if symptoms are persistent. - fluconazole (DIFLUCAN) 150 MG tablet; Take 1 tablet po once. May repeat dose in 3 days as needed  for persistent symptoms.  Dispense: 3 tablet; Refill: 0   Problem List Items Addressed This Visit       Respiratory   Acute recurrent pansinusitis - Primary   Relevant Medications   amoxicillin-clavulanate (AUGMENTIN) 875-125 MG tablet   fluconazole (DIFLUCAN) 150 MG tablet   predniSONE (DELTASONE) 10 MG tablet   Mild intermittent asthma with  acute exacerbation   Relevant Medications   predniSONE (DELTASONE) 10 MG tablet     Other   Cough   Relevant Orders   POCT Influenza A/B (Completed)   Antibiotic-induced yeast infection   Relevant Medications   fluconazole (DIFLUCAN) 150 MG tablet     Meds ordered this encounter  Medications   amoxicillin-clavulanate (AUGMENTIN) 875-125 MG tablet    Sig: Take 1 tablet by mouth 2 (two) times daily.    Dispense:  20 tablet    Refill:  0    Order Specific Question:   Supervising Provider    Answer:   Beatrice Lecher D [[id number]]   fluconazole (DIFLUCAN) 150 MG tablet    Sig: Take 1 tablet po once. May repeat dose in 3 days as needed for persistent symptoms.    Dispense:  3 tablet    Refill:  0    Order Specific Question:   Supervising Provider    Answer:   Beatrice Lecher D [[id number]]   predniSONE (DELTASONE) 10 MG tablet    Sig: Take 1 tablet three times a day with a meal for three for three days, take 1 tablet by twice daily with a meal for 3 days, take 1 tablet once daily with a meal for 3 days    Dispense:  18 tablet    Refill:  0    Order Specific Question:   Supervising Provider    Answer:   Beatrice Lecher D [[id number]]     Ronnell Freshwater, NP  This note was dictated using Dragon Voice Recognition Software. Rapid proofreading was performed to expedite the delivery of the information. Despite proofreading, phonetic errors will occur which are common with this voice recognition software. Please take this into consideration. If there are any concerns, please contact our office.

## 2022-01-14 DIAGNOSIS — B379 Candidiasis, unspecified: Secondary | ICD-10-CM | POA: Insufficient documentation

## 2022-01-14 DIAGNOSIS — T3695XA Adverse effect of unspecified systemic antibiotic, initial encounter: Secondary | ICD-10-CM | POA: Insufficient documentation

## 2022-01-15 DIAGNOSIS — G4733 Obstructive sleep apnea (adult) (pediatric): Secondary | ICD-10-CM | POA: Diagnosis not present

## 2022-01-25 ENCOUNTER — Encounter: Payer: Self-pay | Admitting: Nurse Practitioner

## 2022-01-25 ENCOUNTER — Other Ambulatory Visit: Payer: Self-pay

## 2022-01-25 ENCOUNTER — Ambulatory Visit (INDEPENDENT_AMBULATORY_CARE_PROVIDER_SITE_OTHER): Payer: Medicare Other | Admitting: Nurse Practitioner

## 2022-01-25 VITALS — BP 124/77 | HR 67 | Temp 98.1°F | Ht 64.0 in | Wt 246.7 lb

## 2022-01-25 DIAGNOSIS — E782 Mixed hyperlipidemia: Secondary | ICD-10-CM

## 2022-01-25 DIAGNOSIS — R5383 Other fatigue: Secondary | ICD-10-CM

## 2022-01-25 DIAGNOSIS — F411 Generalized anxiety disorder: Secondary | ICD-10-CM

## 2022-01-25 DIAGNOSIS — Z0000 Encounter for general adult medical examination without abnormal findings: Secondary | ICD-10-CM | POA: Diagnosis not present

## 2022-01-25 DIAGNOSIS — F5101 Primary insomnia: Secondary | ICD-10-CM

## 2022-01-25 DIAGNOSIS — Z6841 Body Mass Index (BMI) 40.0 and over, adult: Secondary | ICD-10-CM

## 2022-01-25 MED ORDER — ZOLPIDEM TARTRATE 10 MG PO TABS: 10.0000 mg | ORAL_TABLET | Freq: Every evening | ORAL | 1 refills | Status: DC | PRN

## 2022-01-25 MED ORDER — ALPRAZOLAM 1 MG PO TABS: ORAL_TABLET | ORAL | 1 refills | Status: DC

## 2022-01-25 MED ORDER — ATORVASTATIN CALCIUM 10 MG PO TABS: 10.0000 mg | ORAL_TABLET | Freq: Every day | ORAL | 3 refills | Status: DC

## 2022-01-25 NOTE — Progress Notes (Signed)
Established patient visit   Patient: Janet Austin   DOB: 1961/11/09   61 y.o. Female  MRN: [id number] Visit Date: 01/25/2022   Chief Complaint  Patient presents with   Medication Refill   Subjective    HPI  The patient presents for routine follow up. Patient states that she is feeling well after having been treated for sinusitis with bronchitis and cough. She did have to do extended round prednisone.  -due to have routine, fasting labs -due to have refills of ambien and alprazolam. The patient does take ambien most nights to help her sleep. She does take alprazolam 0.16m up to twice daily if needed for acute anxiety. She is aware of the risks of taking these medications.  She has been taking this since she was treated for breast cancer several years ago. She did undergo a radical mastectomy with reconstruction. She ultimately had the implants removed. She was started on these medications during her treatment after trying multiple medications to help with her anxiety and difficulty sleeping. She has no negative side effects from taking these medications. We have discussed lowering the doses of her alprazolam. However, she is now experiencing a great deal of family related stress which is contributing to ongoing anxiety. She does not wish to make changes at this time. She does have a non-opioid agreement signed and dated 09/05/2021.   A review of her PDMP showed overdose score is 150 and her last fill of both medication was 12/04/2021.  -she has no new concerns or complaints today    Medications: Outpatient Medications Prior to Visit  Medication Sig   albuterol (VENTOLIN HFA) 108 (90 Base) MCG/ACT inhaler Inhale 2 puffs into the lungs every 6 (six) hours as needed for wheezing or shortness of breath.   clobetasol ointment (TEMOVATE) 05.46% Apply 1 application topically daily as needed.   SYMBICORT 160-4.5 MCG/ACT inhaler INHALE TWO PUFFS BY MOUTH TWICE A DAY   triamcinolone ointment (KENALOG)  0.5 % Apply 1 application topically 2 (two) times daily.   valACYclovir (VALTREX) 1000 MG tablet Take 1 tablet (1,000 mg total) by mouth 2 (two) times daily.   [DISCONTINUED] ALPRAZolam (XANAX) 1 MG tablet Take 1/2 to 1 tablet po QD prn anxiety   [DISCONTINUED] atorvastatin (LIPITOR) 10 MG tablet Take 1 tablet (10 mg total) by mouth daily.   [DISCONTINUED] zolpidem (AMBIEN) 10 MG tablet Take 1 tablet (10 mg total) by mouth at bedtime as needed. for sleep   amoxicillin-clavulanate (AUGMENTIN) 875-125 MG tablet Take 1 tablet by mouth 2 (two) times daily. (Patient not taking: Reported on 01/25/2022)   fluconazole (DIFLUCAN) 150 MG tablet Take 1 tablet po once. May repeat dose in 3 days as needed for persistent symptoms. (Patient not taking: Reported on 01/25/2022)   predniSONE (DELTASONE) 10 MG tablet Take 1 tablet three times a day with a meal for three for three days, take 1 tablet by twice daily with a meal for 3 days, take 1 tablet once daily with a meal for 3 days (Patient not taking: Reported on 01/25/2022)   No facility-administered medications prior to visit.    Review of Systems  Constitutional:  Positive for fatigue. Negative for activity change, appetite change, chills and fever.  HENT:  Negative for congestion, postnasal drip, rhinorrhea, sinus pressure, sinus pain, sneezing and sore throat.   Eyes: Negative.   Respiratory:  Positive for cough and wheezing. Negative for chest tightness and shortness of breath.   Cardiovascular:  Negative for chest pain  and palpitations.  Gastrointestinal:  Negative for abdominal pain, constipation, diarrhea, nausea and vomiting.  Endocrine: Negative for cold intolerance, heat intolerance, polydipsia and polyuria.  Genitourinary:  Negative for dyspareunia, dysuria, flank pain, frequency and urgency.  Musculoskeletal:  Negative for arthralgias, back pain and myalgias.  Skin:  Negative for rash.  Allergic/Immunologic: Negative for environmental allergies.   Neurological:  Negative for dizziness, weakness and headaches.  Hematological:  Negative for adenopathy.  Psychiatric/Behavioral:  Positive for sleep disturbance. The patient is nervous/anxious.        Intermittent.       Objective     Today's Vitals   01/25/22 1007  BP: 124/77  Pulse: 67  Temp: 98.1 F (36.7 C)  SpO2: 99%  Weight: 246 lb 11.2 oz (111.9 kg)  Height: '5\' 4"'  (1.626 m)   Body mass index is 42.35 kg/m.   BP Readings from Last 3 Encounters:  01/25/22 124/77  01/09/22 113/75  11/28/21 118/81    Wt Readings from Last 3 Encounters:  01/25/22 246 lb 11.2 oz (111.9 kg)  01/09/22 245 lb 11.2 oz (111.4 kg)  11/28/21 245 lb (111.1 kg)    Physical Exam Vitals and nursing note reviewed.  Constitutional:      Appearance: Normal appearance. She is well-developed. She is obese.  HENT:     Head: Normocephalic and atraumatic.     Right Ear: Tympanic membrane, ear canal and external ear normal.     Left Ear: Tympanic membrane, ear canal and external ear normal.     Nose: Nose normal.     Mouth/Throat:     Mouth: Mucous membranes are moist.     Pharynx: Oropharynx is clear.  Eyes:     Extraocular Movements: Extraocular movements intact.     Conjunctiva/sclera: Conjunctivae normal.     Pupils: Pupils are equal, round, and reactive to light.  Cardiovascular:     Rate and Rhythm: Normal rate and regular rhythm.     Pulses: Normal pulses.     Heart sounds: Normal heart sounds.  Pulmonary:     Effort: Pulmonary effort is normal.     Breath sounds: Normal breath sounds.  Abdominal:     Palpations: Abdomen is soft.  Musculoskeletal:        General: Normal range of motion.     Cervical back: Normal range of motion and neck supple.  Lymphadenopathy:     Cervical: No cervical adenopathy.  Skin:    General: Skin is warm and dry.     Capillary Refill: Capillary refill takes less than 2 seconds.  Neurological:     General: No focal deficit present.     Mental  Status: She is alert and oriented to person, place, and time.  Psychiatric:        Mood and Affect: Mood normal.        Behavior: Behavior normal.        Thought Content: Thought content normal.        Judgment: Judgment normal.      Results for orders placed or performed in visit on 01/25/22  Comp Met (CMET)  Result Value Ref Range   Glucose 112 (H) 70 - 99 mg/dL   BUN 11 8 - 27 mg/dL   Creatinine, Ser 0.76 0.57 - 1.00 mg/dL   eGFR 89 >59 mL/min/1.73   BUN/Creatinine Ratio 14 12 - 28   Sodium 141 134 - 144 mmol/L   Potassium 4.4 3.5 - 5.2 mmol/L   Chloride 105 96 -  106 mmol/L   CO2 23 20 - 29 mmol/L   Calcium 9.3 8.7 - 10.3 mg/dL   Total Protein 6.5 6.0 - 8.5 g/dL   Albumin 4.3 3.8 - 4.8 g/dL   Globulin, Total 2.2 1.5 - 4.5 g/dL   Albumin/Globulin Ratio 2.0 1.2 - 2.2   Bilirubin Total 0.2 0.0 - 1.2 mg/dL   Alkaline Phosphatase 126 (H) 44 - 121 IU/L   AST 17 0 - 40 IU/L   ALT 23 0 - 32 IU/L  CBC  Result Value Ref Range   WBC 6.6 3.4 - 10.8 x10E3/uL   RBC 4.83 3.77 - 5.28 x10E6/uL   Hemoglobin 13.9 11.1 - 15.9 g/dL   Hematocrit 42.0 34.0 - 46.6 %   MCV 87 79 - 97 fL   MCH 28.8 26.6 - 33.0 pg   MCHC 33.1 31.5 - 35.7 g/dL   RDW 13.5 11.7 - 15.4 %   Platelets 269 150 - 450 x10E3/uL  Lipid panel  Result Value Ref Range   Cholesterol, Total 159 100 - 199 mg/dL   Triglycerides 143 0 - 149 mg/dL   HDL 45 >39 mg/dL   VLDL Cholesterol Cal 25 5 - 40 mg/dL   LDL Chol Calc (NIH) 89 0 - 99 mg/dL   Chol/HDL Ratio 3.5 0.0 - 4.4 ratio  Hemoglobin A1c  Result Value Ref Range   Hgb A1c MFr Bld 6.4 (H) 4.8 - 5.6 %   Est. average glucose Bld gHb Est-mCnc 137 mg/dL  TSH  Result Value Ref Range   TSH 2.540 0.450 - 4.500 uIU/mL  T4, free  Result Value Ref Range   Free T4 1.06 0.82 - 1.77 ng/dL  Vitamin D (25 hydroxy)  Result Value Ref Range   Vit D, 25-Hydroxy 17.6 (L) 30.0 - 100.0 ng/mL    Assessment & Plan    1. Mixed hyperlipidemia Check fasting lipid panel and adjust  dosing of atorvastatin as indicated.  - atorvastatin (LIPITOR) 10 MG tablet; Take 1 tablet (10 mg total) by mouth daily.  Dispense: 90 tablet; Refill: 3 - Lipid panel; Future - Vitamin D (25 hydroxy); Future - Lipid panel - Vitamin D (25 hydroxy)  2. Other fatigue Check thyroid panel  - T4, free; Future - T4, free  3. Generalized anxiety disorder May continue to take alprazolam 0.86m up to twice daily as needed. A new prescription was sent to her pharmacy today.  - ALPRAZolam (XANAX) 1 MG tablet; Take 1/2 to 1 tablet po QD prn anxiety  Dispense: 90 tablet; Refill: 1  4. Primary insomnia May continue zolpidem 131mat bedtime as needed for insomnia. A new prescription was sent to her pharmacy today  - zolpidem (AMBIEN) 10 MG tablet; Take 1 tablet (10 mg total) by mouth at bedtime as needed. for sleep  Dispense: 90 tablet; Refill: 1  5. Body mass index (BMI) of 40.1-44.9 in adult ([facility] lowering calorie intake to 1500 calories per day and incorporating exercise into daily routine to help lose weight. .   6. Health care maintenance Routine, fasting labs drawn during today's visit.  - TSH; Future - Hemoglobin A1c; Future - CBC; Future - Comp Met (CMET); Future - Comp Met (CMET) - CBC - Hemoglobin A1c - TSH    Problem List Items Addressed This Visit       Other   Generalized anxiety disorder   Relevant Medications   ALPRAZolam (XANAX) 1 MG tablet   Primary insomnia   Relevant Medications  zolpidem (AMBIEN) 10 MG tablet   Mixed hyperlipidemia - Primary   Relevant Medications   atorvastatin (LIPITOR) 10 MG tablet   Other Relevant Orders   Lipid panel (Completed)   Vitamin D (25 hydroxy) (Completed)   Fatigue   Relevant Orders   T4, free (Completed)   Body mass index (BMI) of 40.1-44.9 in adult [facility])   Other Visit Diagnoses     Health care maintenance       Relevant Orders   TSH (Completed)   Hemoglobin A1c (Completed)   CBC (Completed)   Comp Met  (CMET) (Completed)        Return in about 3 months (around 04/24/2022) for mood.         Ronnell Freshwater, NP  [facility] [phone] (phone) [phone] (fax)  [place]

## 2022-01-26 LAB — COMPREHENSIVE METABOLIC PANEL
ALT: 23 IU/L (ref 0–32)
AST: 17 IU/L (ref 0–40)
Albumin/Globulin Ratio: 2 (ref 1.2–2.2)
Albumin: 4.3 g/dL (ref 3.8–4.8)
Alkaline Phosphatase: 126 IU/L — ABNORMAL HIGH (ref 44–121)
BUN/Creatinine Ratio: 14 (ref 12–28)
BUN: 11 mg/dL (ref 8–27)
Bilirubin Total: 0.2 mg/dL (ref 0.0–1.2)
CO2: 23 mmol/L (ref 20–29)
Calcium: 9.3 mg/dL (ref 8.7–10.3)
Chloride: 105 mmol/L (ref 96–106)
Creatinine, Ser: 0.76 mg/dL (ref 0.57–1.00)
Globulin, Total: 2.2 g/dL (ref 1.5–4.5)
Glucose: 112 mg/dL — ABNORMAL HIGH (ref 70–99)
Potassium: 4.4 mmol/L (ref 3.5–5.2)
Sodium: 141 mmol/L (ref 134–144)
Total Protein: 6.5 g/dL (ref 6.0–8.5)
eGFR: 89 mL/min/{1.73_m2} (ref >59)

## 2022-01-26 LAB — HEMOGLOBIN A1C
Est. average glucose Bld gHb Est-mCnc: 137 mg/dL
Hgb A1c MFr Bld: 6.4 % — ABNORMAL HIGH (ref 4.8–5.6)

## 2022-01-26 LAB — LIPID PANEL
Chol/HDL Ratio: 3.5 ratio (ref 0.0–4.4)
Cholesterol, Total: 159 mg/dL (ref 100–199)
HDL: 45 mg/dL (ref >39)
LDL Chol Calc (NIH): 89 mg/dL (ref 0–99)
Triglycerides: 143 mg/dL (ref 0–149)
VLDL Cholesterol Cal: 25 mg/dL (ref 5–40)

## 2022-01-26 LAB — T4, FREE: Free T4: 1.06 ng/dL (ref 0.82–1.77)

## 2022-01-26 LAB — CBC
Hematocrit: 42 % (ref 34.0–46.6)
Hemoglobin: 13.9 g/dL (ref 11.1–15.9)
MCH: 28.8 pg (ref 26.6–33.0)
MCHC: 33.1 g/dL (ref 31.5–35.7)
MCV: 87 fL (ref 79–97)
Platelets: 269 10*3/uL (ref 150–450)
RBC: 4.83 x10E6/uL (ref 3.77–5.28)
RDW: 13.5 % (ref 11.7–15.4)
WBC: 6.6 10*3/uL (ref 3.4–10.8)

## 2022-01-26 LAB — VITAMIN D 25 HYDROXY (VIT D DEFICIENCY, FRACTURES): Vit D, 25-Hydroxy: 17.6 ng/mL — ABNORMAL LOW (ref 30.0–100.0)

## 2022-01-26 LAB — TSH: TSH: 2.54 u[IU]/mL (ref 0.450–4.500)

## 2022-01-31 ENCOUNTER — Encounter: Payer: Self-pay | Admitting: Nurse Practitioner

## 2022-01-31 ENCOUNTER — Other Ambulatory Visit: Payer: Self-pay | Admitting: Nurse Practitioner

## 2022-01-31 ENCOUNTER — Other Ambulatory Visit: Payer: Self-pay | Admitting: Pulmonary Disease

## 2022-01-31 DIAGNOSIS — Z6841 Body Mass Index (BMI) 40.0 and over, adult: Secondary | ICD-10-CM | POA: Insufficient documentation

## 2022-01-31 DIAGNOSIS — E559 Vitamin D deficiency, unspecified: Secondary | ICD-10-CM

## 2022-01-31 MED ORDER — ERGOCALCIFEROL 1.25 MG (50000 UT) PO CAPS: 50000.0000 [IU] | ORAL_CAPSULE | ORAL | 5 refills | Status: DC

## 2022-01-31 NOTE — Progress Notes (Signed)
Vitamin d deficiency. Start dristol 50000 iu weekly for next several months. Will then do over the counter vitamin d daily. Blood sugars are starting to creep up a little. HgbA1c 6.4. likely due to the prednisone recently. Will monitor. MyChart message sent to patient.

## 2022-01-31 NOTE — Patient Instructions (Signed)
Fat and Cholesterol Restricted Eating Plan Getting too much fat and cholesterol in your diet may cause health problems. Choosing the right foods helps keep your fat and cholesterol at normal levels. This can keep you from getting certain diseases. Your doctor may recommend an eating plan that includes: Total fat: ______% or less of total calories a day. This is ______g of fat a day. Saturated fat: ______% or less of total calories a day. This is ______g of saturated fat a day. Cholesterol: less than _________mg a day. Fiber: ______g a day. What are tips for following this plan? General tips Work with your doctor to lose weight if you need to. Avoid: Foods with added sugar. Fried foods. Foods with trans fat or partially hydrogenated oils. This includes some margarines and baked goods. If you drink alcohol: Limit how much you have to: 0-1 drink a day for women who are not pregnant. 0-2 drinks a day for men. Know how much alcohol is in a drink. In the [place] one drink equals one 12 oz bottle of beer (355 mL), one 5 oz glass of wine (148 mL), or one 1 oz glass of hard liquor (44 mL). Reading food labels Check food labels for: Trans fats. Partially hydrogenated oils. Saturated fat (g) in each serving. Cholesterol (mg) in each serving. Fiber (g) in each serving. Choose foods with healthy fats, such as: Monounsaturated fats and polyunsaturated fats. These include olive and canola oil, flaxseeds, walnuts, almonds, and seeds. Omega-3 fats. These are found in certain fish, flaxseed oil, and ground flaxseeds. Choose grain products that have whole grains. Look for the word "whole" as the first word in the ingredient list. Cooking Cook foods using low-fat methods. These include baking, boiling, grilling, and broiling. Eat more home-cooked foods. Eat at restaurants and buffets less often. Eat less fast food. Avoid cooking using saturated fats, such as butter, cream, palm oil, palm kernel oil, and  coconut oil. Meal planning  At meals, divide your plate into four equal parts: Fill one-half of your plate with vegetables, green salads, and fruit. Fill one-fourth of your plate with whole grains. Fill one-fourth of your plate with low-fat (lean) protein foods. Eat fish that is high in omega-3 fats at least two times a week. This includes mackerel, tuna, sardines, and salmon. Eat foods that are high in fiber, such as whole grains, beans, apples, pears, berries, broccoli, carrots, peas, and barley. What foods should I eat? Fruits All fresh, canned (in natural juice), or frozen fruits. Vegetables Fresh or frozen vegetables (raw, steamed, roasted, or grilled). Green salads. Grains Whole grains, such as whole wheat or whole grain breads, crackers, cereals, and pasta. Unsweetened oatmeal, bulgur, barley, quinoa, or brown rice. Corn or whole wheat flour tortillas. Meats and other protein foods Ground beef (85% or leaner), grass-fed beef, or beef trimmed of fat. Skinless chicken or turkey. Ground chicken or turkey. Pork trimmed of fat. All fish and seafood. Egg whites. Dried beans, peas, or lentils. Unsalted nuts or seeds. Unsalted canned beans. Nut butters without added sugar or oil. Dairy Low-fat or nonfat dairy products, such as skim or 1% milk, 2% or reduced-fat cheeses, low-fat and fat-free ricotta or cottage cheese, or plain low-fat and nonfat yogurt. Fats and oils Tub margarine without trans fats. Light or reduced-fat mayonnaise and salad dressings. Avocado. Olive, canola, sesame, or safflower oils. The items listed above may not be a complete list of foods and beverages you can eat. Contact a dietitian for more information. What foods   should I avoid? Fruits Canned fruit in heavy syrup. Fruit in cream or butter sauce. Fried fruit. Vegetables Vegetables cooked in cheese, cream, or butter sauce. Fried vegetables. Grains White bread. White pasta. White rice. Cornbread. Bagels, pastries,  and croissants. Crackers and snack foods that contain trans fat and hydrogenated oils. Meats and other protein foods Fatty cuts of meat. Ribs, chicken wings, bacon, sausage, bologna, salami, chitterlings, fatback, hot dogs, bratwurst, and packaged lunch meats. Liver and organ meats. Whole eggs and egg yolks. Chicken and turkey with skin. Fried meat. Dairy Whole or 2% milk, cream, half-and-half, and cream cheese. Whole milk cheeses. Whole-fat or sweetened yogurt. Full-fat cheeses. Nondairy creamers and whipped toppings. Processed cheese, cheese spreads, and cheese curds. Fats and oils Butter, stick margarine, lard, shortening, ghee, or bacon fat. Coconut, palm kernel, and palm oils. Beverages Alcohol. Sugar-sweetened drinks such as sodas, lemonade, and fruit drinks. Sweets and desserts Corn syrup, sugars, honey, and molasses. Candy. Jam and jelly. Syrup. Sweetened cereals. Cookies, pies, cakes, donuts, muffins, and ice cream. The items listed above may not be a complete list of foods and beverages you should avoid. Contact a dietitian for more information. Summary Choosing the right foods helps keep your fat and cholesterol at normal levels. This can keep you from getting certain diseases. At meals, fill one-half of your plate with vegetables, green salads, and fruits. Eat high fiber foods, like whole grains, beans, apples, pears, berries, carrots, peas, and barley. Limit added sugar, saturated fats, alcohol, and fried foods. This information is not intended to replace advice given to you by your health care provider. Make sure you discuss any questions you have with your health care provider. Document Revised: 04/14/2021 Document Reviewed: 04/14/2021 Elsevier Patient Education  2022 [employer]  

## 2022-02-01 ENCOUNTER — Other Ambulatory Visit: Payer: Self-pay

## 2022-02-01 DIAGNOSIS — E559 Vitamin D deficiency, unspecified: Secondary | ICD-10-CM

## 2022-02-01 MED ORDER — ERGOCALCIFEROL 1.25 MG (50000 UT) PO CAPS: 50000.0000 [IU] | ORAL_CAPSULE | ORAL | 5 refills | Status: DC

## 2022-02-13 DIAGNOSIS — G4733 Obstructive sleep apnea (adult) (pediatric): Secondary | ICD-10-CM | POA: Diagnosis not present

## 2022-03-07 DIAGNOSIS — M1711 Unilateral primary osteoarthritis, right knee: Secondary | ICD-10-CM | POA: Diagnosis not present

## 2022-03-13 DIAGNOSIS — G4733 Obstructive sleep apnea (adult) (pediatric): Secondary | ICD-10-CM | POA: Diagnosis not present

## 2022-04-13 DIAGNOSIS — G4733 Obstructive sleep apnea (adult) (pediatric): Secondary | ICD-10-CM | POA: Diagnosis not present

## 2022-04-20 DIAGNOSIS — H40053 Ocular hypertension, bilateral: Secondary | ICD-10-CM | POA: Diagnosis not present

## 2022-04-24 ENCOUNTER — Ambulatory Visit (INDEPENDENT_AMBULATORY_CARE_PROVIDER_SITE_OTHER): Payer: Medicare Other | Admitting: Nurse Practitioner

## 2022-04-24 ENCOUNTER — Encounter: Payer: Self-pay | Admitting: Nurse Practitioner

## 2022-04-24 VITALS — BP 127/84 | HR 84 | Temp 97.9°F | Ht 64.17 in | Wt 246.1 lb

## 2022-04-24 DIAGNOSIS — J0141 Acute recurrent pansinusitis: Secondary | ICD-10-CM

## 2022-04-24 DIAGNOSIS — F411 Generalized anxiety disorder: Secondary | ICD-10-CM

## 2022-04-24 DIAGNOSIS — Z6841 Body Mass Index (BMI) 40.0 and over, adult: Secondary | ICD-10-CM

## 2022-04-24 DIAGNOSIS — E782 Mixed hyperlipidemia: Secondary | ICD-10-CM | POA: Diagnosis not present

## 2022-04-24 DIAGNOSIS — F5101 Primary insomnia: Secondary | ICD-10-CM

## 2022-04-24 DIAGNOSIS — J4521 Mild intermittent asthma with (acute) exacerbation: Secondary | ICD-10-CM | POA: Diagnosis not present

## 2022-04-24 MED ORDER — PREDNISONE 10 MG PO TABS: ORAL_TABLET | ORAL | 0 refills | Status: DC

## 2022-04-24 MED ORDER — AMOXICILLIN-POT CLAVULANATE 875-125 MG PO TABS: 1.0000 | ORAL_TABLET | Freq: Two times a day (BID) | ORAL | 0 refills | Status: DC

## 2022-04-24 NOTE — Progress Notes (Signed)
Established patient visit ? ? ?Patient: [facility]   DOB: Jun 02, 1961   61 y.o. Female  MRN: [id number] ?Visit Date: 04/24/2022 ? ? ?Chief Complaint  ?Patient presents with  ? Follow-up  ? ?Subjective  ?  ?HPI  ?Routine follow up for anxiety and insomnia ?-has refills remaining on both alprazolam and zolpidem   ?--PDMP profile reviewed today. Overdose risk score is 150. Last filled both medications on 03/19/2022.  ?-taking lipitor for high cholesterol.  ?--recent lipid panel normal. ?-elevated glucose with HgbA1c 6.4 today  ?-vitamin d low. Treated with Drisdol.   ?-feeling poorly. Has cough, nasal congestion, and sinus headache. She states that this is the 7th day she has had symptoms. She did take a home test for COVID 19 today and the test was negative . ? ? ?Medications: ?Outpatient Medications Prior to Visit  ?Medication Sig  ? albuterol (VENTOLIN HFA) 108 (90 Base) MCG/ACT inhaler Inhale 2 puffs into the lungs every 6 (six) hours as needed for wheezing or shortness of breath.  ? ALPRAZolam (XANAX) 1 MG tablet Take 1/2 to 1 tablet po QD prn anxiety  ? atorvastatin (LIPITOR) 10 MG tablet Take 1 tablet (10 mg total) by mouth daily.  ? clobetasol ointment (TEMOVATE) 0.27 % Apply 1 application topically daily as needed.  ? ergocalciferol (DRISDOL) 1.25 MG (50000 UT) capsule Take 1 capsule (50,000 Units total) by mouth once a week.  ? SYMBICORT 160-4.5 MCG/ACT inhaler INHALE 2 PUFFS BY MOUTH TWICE A DAY  ? triamcinolone ointment (KENALOG) 0.5 % Apply 1 application topically 2 (two) times daily.  ? valACYclovir (VALTREX) 1000 MG tablet Take 1 tablet (1,000 mg total) by mouth 2 (two) times daily.  ? zolpidem (AMBIEN) 10 MG tablet Take 1 tablet (10 mg total) by mouth at bedtime as needed. for sleep  ? [DISCONTINUED] amoxicillin-clavulanate (AUGMENTIN) 875-125 MG tablet Take 1 tablet by mouth 2 (two) times daily. (Patient not taking: Reported on 01/25/2022)  ? [DISCONTINUED] fluconazole (DIFLUCAN) 150 MG tablet Take 1  tablet po once. May repeat dose in 3 days as needed for persistent symptoms. (Patient not taking: Reported on 01/25/2022)  ? [DISCONTINUED] predniSONE (DELTASONE) 10 MG tablet Take 1 tablet three times a day with a meal for three for three days, take 1 tablet by twice daily with a meal for 3 days, take 1 tablet once daily with a meal for 3 days (Patient not taking: Reported on 01/25/2022)  ? ?No facility-administered medications prior to visit.  ? ? ?Review of Systems  ?Constitutional:  Positive for fatigue. Negative for activity change, appetite change, chills and fever.  ?HENT:  Positive for congestion, postnasal drip, rhinorrhea, sinus pressure, sinus pain and sore throat. Negative for sneezing.   ?Eyes: Negative.   ?Respiratory:  Positive for cough and wheezing. Negative for chest tightness and shortness of breath.   ?Cardiovascular:  Negative for chest pain and palpitations.  ?Gastrointestinal:  Negative for abdominal pain, constipation, diarrhea, nausea and vomiting.  ?Endocrine: Negative for cold intolerance, heat intolerance, polydipsia and polyuria.  ?Genitourinary:  Negative for dyspareunia, dysuria, flank pain, frequency and urgency.  ?Musculoskeletal:  Negative for arthralgias, back pain and myalgias.  ?Skin:  Negative for rash.  ?Allergic/Immunologic: Positive for environmental allergies.  ?Neurological:  Positive for headaches. Negative for dizziness and weakness.  ?Hematological:  Positive for adenopathy.  ?Psychiatric/Behavioral:  Positive for sleep disturbance. The patient is nervous/anxious.   ? ?Last CBC ?Lab Results  ?Component Value Date  ? WBC 6.6 01/25/2022  ?  HGB 13.9 01/25/2022  ? HCT 42.0 01/25/2022  ? MCV 87 01/25/2022  ? MCH 28.8 01/25/2022  ? RDW 13.5 01/25/2022  ? PLT 269 01/25/2022  ? ?Last metabolic panel ?Lab Results  ?Component Value Date  ? GLUCOSE 112 (H) 01/25/2022  ? NA 141 01/25/2022  ? K 4.4 01/25/2022  ? CL 105 01/25/2022  ? CO2 23 01/25/2022  ? BUN 11 01/25/2022  ? CREATININE  0.76 01/25/2022  ? EGFR 89 01/25/2022  ? CALCIUM 9.3 01/25/2022  ? PROT 6.5 01/25/2022  ? ALBUMIN 4.3 01/25/2022  ? LABGLOB 2.2 01/25/2022  ? AGRATIO 2.0 01/25/2022  ? BILITOT 0.2 01/25/2022  ? ALKPHOS 126 (H) 01/25/2022  ? AST 17 01/25/2022  ? ALT 23 01/25/2022  ? ANIONGAP 9 12/25/2012  ? ?Last lipids ?Lab Results  ?Component Value Date  ? CHOL 159 01/25/2022  ? HDL 45 01/25/2022  ? [place] 89 01/25/2022  ? TRIG 143 01/25/2022  ? CHOLHDL 3.5 01/25/2022  ? ?Last hemoglobin A1c ?Lab Results  ?Component Value Date  ? HGBA1C 6.4 (H) 01/25/2022  ? ?Last thyroid functions ?Lab Results  ?Component Value Date  ? TSH 2.540 01/25/2022  ? ?Last vitamin D ?Lab Results  ?Component Value Date  ? VD25OH 17.6 (L) 01/25/2022  ? ?  ? ? Objective  ?  ? ?Today's Vitals  ? 04/24/22 1426  ?BP: 127/84  ?Pulse: 84  ?Temp: 97.9 ?F (36.6 ?C)  ?SpO2: 95%  ?Weight: 246 lb 1.9 oz (111.6 kg)  ?Height: 5' 4.17" (1.63 m)  ? ?Body mass index is 42.02 kg/m?.  ? ?BP Readings from Last 3 Encounters:  ?04/24/22 127/84  ?01/25/22 124/77  ?01/09/22 113/75  ?  ?Wt Readings from Last 3 Encounters:  ?04/24/22 246 lb 1.9 oz (111.6 kg)  ?01/25/22 246 lb 11.2 oz (111.9 kg)  ?01/09/22 245 lb 11.2 oz (111.4 kg)  ?  ?Physical Exam ?Vitals and nursing note reviewed.  ?Constitutional:   ?   Appearance: Normal appearance. She is well-developed. She is ill-appearing.  ?HENT:  ?   Head: Normocephalic and atraumatic.  ?   Right Ear: Tympanic membrane is erythematous and bulging.  ?   Left Ear: Tympanic membrane is erythematous and bulging.  ?   Nose: Congestion present.  ?   Right Sinus: Maxillary sinus tenderness and frontal sinus tenderness present.  ?   Left Sinus: Maxillary sinus tenderness and frontal sinus tenderness present.  ?   Mouth/Throat:  ?   Pharynx: Posterior oropharyngeal erythema present.  ?Eyes:  ?   Extraocular Movements: Extraocular movements intact.  ?   Conjunctiva/sclera: Conjunctivae normal.  ?   Pupils: Pupils are equal, round, and reactive to  light.  ?Cardiovascular:  ?   Rate and Rhythm: Normal rate and regular rhythm.  ?   Pulses: Normal pulses.  ?   Heart sounds: Normal heart sounds.  ?Pulmonary:  ?   Effort: Pulmonary effort is normal.  ?   Breath sounds: Normal breath sounds.  ?   Comments: Dry, harsh cough noted ?Abdominal:  ?   Palpations: Abdomen is soft.  ?Musculoskeletal:     ?   General: Normal range of motion.  ?   Cervical back: Normal range of motion and neck supple.  ?Lymphadenopathy:  ?   Cervical: No cervical adenopathy.  ?Skin: ?   General: Skin is warm and dry.  ?   Capillary Refill: Capillary refill takes less than 2 seconds.  ?Neurological:  ?   General: No focal  deficit present.  ?   Mental Status: She is alert and oriented to person, place, and time.  ?Psychiatric:     ?   Mood and Affect: Mood normal.     ?   Behavior: Behavior normal.     ?   Thought Content: Thought content normal.     ?   Judgment: Judgment normal.  ?  ? ? Assessment & Plan  ?  ?1. Acute recurrent pansinusitis ?Start augmentin 852m twice daily for 10 days. Rest and increase fluids. Continue using OTC medication to control symptoms.   ?- amoxicillin-clavulanate (AUGMENTIN) 875-125 MG tablet; Take 1 tablet by mouth 2 (two) times daily.  Dispense: 20 tablet; Refill: 0 ? ?2. Mild intermittent asthma with acute exacerbation ?Add prednisone taper. Take as directed for 9 days. Use inhalers and respiratory medications as needed and as prescribed  ?- predniSONE (DELTASONE) 10 MG tablet; Take 1 tablet three times a day with a meal for three for three days, take 1 tablet by twice daily with a meal for 3 days, take 1 tablet once daily with a meal for 3 days  Dispense: 18 tablet; Refill: 0 ? ?3. Mixed hyperlipidemia ?Recent lipid panel normal. Continue atorvastatin as prescribed  ? ?4. Primary insomnia ?May continue to zolpidem 10 mg at bedtime as needed.  Refill as indicated. ? ?5. Generalized anxiety disorder ?May take alprazolam 0.5 mg up to twice daily as needed for  acute anxiety. Refill as indicated.  ? ?6. Body mass index (BMI) of 40.1-44.9 in adult ([facility] ?Discussed lowering calorie intake to 1500 calories per day and incorporating exercise into daily routine to help l

## 2022-05-09 ENCOUNTER — Other Ambulatory Visit: Payer: Self-pay

## 2022-05-09 MED ORDER — BUDESONIDE-FORMOTEROL FUMARATE 160-4.5 MCG/ACT IN AERO: 2.0000 | INHALATION_SPRAY | Freq: Two times a day (BID) | RESPIRATORY_TRACT | 1 refills | Status: DC

## 2022-07-21 ENCOUNTER — Other Ambulatory Visit: Payer: Self-pay | Admitting: Nurse Practitioner

## 2022-07-21 DIAGNOSIS — E559 Vitamin D deficiency, unspecified: Secondary | ICD-10-CM

## 2022-07-25 DIAGNOSIS — M1711 Unilateral primary osteoarthritis, right knee: Secondary | ICD-10-CM | POA: Diagnosis not present

## 2022-08-06 ENCOUNTER — Other Ambulatory Visit: Payer: Self-pay | Admitting: Internal Medicine

## 2022-08-22 DIAGNOSIS — G4733 Obstructive sleep apnea (adult) (pediatric): Secondary | ICD-10-CM | POA: Diagnosis not present

## 2022-08-28 ENCOUNTER — Encounter: Payer: Medicare Other | Admitting: Nurse Practitioner

## 2022-08-30 ENCOUNTER — Other Ambulatory Visit: Payer: Self-pay | Admitting: Nurse Practitioner

## 2022-08-30 DIAGNOSIS — F5101 Primary insomnia: Secondary | ICD-10-CM

## 2022-08-30 DIAGNOSIS — F411 Generalized anxiety disorder: Secondary | ICD-10-CM

## 2022-08-30 NOTE — Telephone Encounter (Signed)
PDMP reviewed with no red flags. Controlled substances policy on file. [place] scheduled 09/18/2022. Filled #30 days with no additional refills .

## 2022-09-10 DIAGNOSIS — H903 Sensorineural hearing loss, bilateral: Secondary | ICD-10-CM | POA: Diagnosis not present

## 2022-09-10 DIAGNOSIS — H6121 Impacted cerumen, right ear: Secondary | ICD-10-CM | POA: Diagnosis not present

## 2022-09-18 ENCOUNTER — Encounter: Payer: Self-pay | Admitting: Nurse Practitioner

## 2022-09-18 ENCOUNTER — Ambulatory Visit (INDEPENDENT_AMBULATORY_CARE_PROVIDER_SITE_OTHER): Payer: Medicare Other | Admitting: Nurse Practitioner

## 2022-09-18 VITALS — BP 125/73 | HR 89 | Ht 64.17 in | Wt 243.2 lb

## 2022-09-18 DIAGNOSIS — F5101 Primary insomnia: Secondary | ICD-10-CM | POA: Diagnosis not present

## 2022-09-18 DIAGNOSIS — Z23 Encounter for immunization: Secondary | ICD-10-CM | POA: Diagnosis not present

## 2022-09-18 DIAGNOSIS — Z0000 Encounter for general adult medical examination without abnormal findings: Secondary | ICD-10-CM | POA: Diagnosis not present

## 2022-09-18 DIAGNOSIS — J4520 Mild intermittent asthma, uncomplicated: Secondary | ICD-10-CM | POA: Diagnosis not present

## 2022-09-18 DIAGNOSIS — F411 Generalized anxiety disorder: Secondary | ICD-10-CM | POA: Diagnosis not present

## 2022-09-18 DIAGNOSIS — Z6841 Body Mass Index (BMI) 40.0 and over, adult: Secondary | ICD-10-CM

## 2022-09-18 MED ORDER — ALPRAZOLAM 1 MG PO TABS: ORAL_TABLET | ORAL | 3 refills | Status: DC

## 2022-09-18 MED ORDER — ALBUTEROL SULFATE HFA 108 (90 BASE) MCG/ACT IN AERS: 2.0000 | INHALATION_SPRAY | Freq: Four times a day (QID) | RESPIRATORY_TRACT | 3 refills | Status: DC | PRN

## 2022-09-18 MED ORDER — ZOLPIDEM TARTRATE 10 MG PO TABS: 10.0000 mg | ORAL_TABLET | Freq: Every evening | ORAL | 3 refills | Status: DC | PRN

## 2022-09-18 NOTE — Progress Notes (Signed)
Subjective:   Janet Austin is a 61 y.o. female who presents for Medicare Annual (Subsequent) preventive examination. -breast cancer survivor  -generalized anxiety -trouble sleeping.  -husband is current undergoing treatment for cancer  Review of Systems    .Review of Systems  Constitutional:  Negative for chills, fever and malaise/fatigue.  HENT:  Negative for congestion, sinus pain and sore throat.   Eyes: Negative.   Respiratory:  Negative for cough, shortness of breath and wheezing.   Cardiovascular:  Negative for chest pain, palpitations and leg swelling.  Gastrointestinal:  Negative for constipation, diarrhea, nausea and vomiting.  Genitourinary: Negative.   Musculoskeletal:  Negative for myalgias.  Skin: Negative.   Neurological:  Negative for dizziness and headaches.  Endo/Heme/Allergies:  Positive for environmental allergies. Does not bruise/bleed easily.  Psychiatric/Behavioral:  Negative for depression. The patient is nervous/anxious and has insomnia.         Objective:    Today's Vitals   09/18/22 1345  BP: 125/73  Pulse: 89  SpO2: 95%  Weight: 243 lb 3.2 oz (110.3 kg)  Height: 5' 4.17" (1.63 m)   Body mass index is 41.52 kg/m.   Current Medications (verified) Outpatient Encounter Medications as of 09/18/2022  Medication Sig   atorvastatin (LIPITOR) 10 MG tablet Take 1 tablet (10 mg total) by mouth daily.   clobetasol ointment (TEMOVATE) 9.56 % Apply 1 application topically daily as needed.   SYMBICORT 160-4.5 MCG/ACT inhaler INHALE 2 PUFFS INTO THE LUNGS TWICE A DAY   triamcinolone ointment (KENALOG) 0.5 % Apply 1 application topically 2 (two) times daily.   valACYclovir (VALTREX) 1000 MG tablet Take 1 tablet (1,000 mg total) by mouth 2 (two) times daily.   Vitamin D, Ergocalciferol, (DRISDOL) 1.25 MG (50000 UNIT) CAPS capsule TAKE ONE CAPSULE BY MOUTH EVERY WEEK   [DISCONTINUED] albuterol (VENTOLIN HFA) 108 (90 Base) MCG/ACT inhaler Inhale 2 puffs into  the lungs every 6 (six) hours as needed for wheezing or shortness of breath.   [DISCONTINUED] ALPRAZolam (XANAX) 1 MG tablet TAKE ONE-HALF TO ONE TABLET BY MOUTH ONE TIME DAILY AS NEEDED FOR ANXIETY   [DISCONTINUED] zolpidem (AMBIEN) 10 MG tablet TAKE ONE TABLET BY MOUTH AT BEDTIME AS NEEDED FOR SLEEP   albuterol (VENTOLIN HFA) 108 (90 Base) MCG/ACT inhaler Inhale 2 puffs into the lungs every 6 (six) hours as needed for wheezing or shortness of breath.   ALPRAZolam (XANAX) 1 MG tablet TAKE ONE-HALF TO ONE TABLET BY MOUTH ONE TIME DAILY AS NEEDED FOR ANXIETY   zolpidem (AMBIEN) 10 MG tablet Take 1 tablet (10 mg total) by mouth at bedtime as needed. for sleep   [DISCONTINUED] amoxicillin-clavulanate (AUGMENTIN) 875-125 MG tablet Take 1 tablet by mouth 2 (two) times daily.   [DISCONTINUED] predniSONE (DELTASONE) 10 MG tablet Take 1 tablet three times a day with a meal for three for three days, take 1 tablet by twice daily with a meal for 3 days, take 1 tablet once daily with a meal for 3 days   No facility-administered encounter medications on file as of 09/18/2022.    Allergies (verified) Other, Asa [aspirin], Gabapentin, Lyrica [pregabalin], and Tape   History: Past Medical History:  Diagnosis Date   Anxiety    Arthritis    Asthma    [employer] 02/13/2021   Cancer ([place])    left breast   Headache    migraines   Hyperlipidemia    [employer] 02/13/2021   PONV (postoperative nausea and vomiting)    Sleep apnea  Past Surgical History:  Procedure Laterality Date   ABDOMINAL HYSTERECTOMY     BREAST SURGERY     double mastectomy   BREAST SURGERY     reconstruction   COLONOSCOPY WITH PROPOFOL N/A 05/29/2016   Procedure: COLONOSCOPY WITH PROPOFOL;  Surgeon: Lollie Sails, MD;  Location: [facility]COPY;  Service: Endoscopy;  Laterality: N/A;   KNEE ARTHROSCOPY Right    MASTECTOMY     Family History  Problem Relation Age of Onset   Hypertension Father    Social History    Socioeconomic History   Marital status: Married    Spouse name: Not on file   Number of children: Not on file   Years of education: Not on file   Highest education level: Not on file  Occupational History   Not on file  Tobacco Use   Smoking status: Never   Smokeless tobacco: Never  Vaping Use   Vaping Use: Never used  Substance and Sexual Activity   Alcohol use: No   Drug use: No   Sexual activity: Not on file  Other Topics Concern   Not on file  Social History Narrative   Not on file   Social Determinants of Health   Financial Resource Strain: Low Risk  (09/18/2022)   Overall Financial Resource Strain (CARDIA)    Difficulty of Paying Living Expenses: Not very hard  Food Insecurity: No Food Insecurity (09/18/2022)   Hunger Vital Sign    Worried About Running Out of Food in the Last Year: Never true    Ran Out of Food in the Last Year: Never true  Transportation Needs: No Transportation Needs (09/18/2022)   PRAPARE - Hydrologist (Medical): No    Lack of Transportation (Non-Medical): No  Physical Activity: Inactive (09/18/2022)   Exercise Vital Sign    Days of Exercise per Week: 0 days    Minutes of Exercise per Session: 0 min  Stress: Stress Concern Present (09/18/2022)   [place]    Feeling of Stress : Rather much  Social Connections: Moderately Integrated (09/18/2022)   Social Connection and Isolation Panel [NHANES]    Frequency of Communication with Friends and Family: Three times a week    Frequency of Social Gatherings with Friends and Family: Once a week    Attends Religious Services: 1 to 4 times per year    Active Member of [employer] or Organizations: No    Attends Music therapist: Never    Marital Status: Married    Tobacco Counseling Counseling given: Not Answered   Clinical Intake:   Pre-visit preparation completed: Yes  Pain : No/denies  pain     BMI - recorded: 41.52 Nutritional Status: BMI > 30  Obese Nutritional Risks: None  Activities of Daily Living: Independent Ambulation: Independent Medication Administration: Independent Home Management: Independent  Barriers to Care Management & Learning: None  Do you feel unsafe in your current relationship?: No Do you feel physically threatened by others?: No Anyone hurting you at home, work, or school?: No Unable to ask?: No  How often do you need to have someone help you when you read instructions, pamphlets, or other written materials from your doctor or pharmacy?: 1 - Never  Interpreter Needed?: No      Pain : No/denies pain     BMI - recorded: 41.52 Nutritional Status: BMI > 30  Obese Nutritional Risks: None  How often do you need  to have someone help you when you read instructions, pamphlets, or other written materials from your doctor or pharmacy?: 1 - Never  Diabetic?no  Interpreter Needed?: No      Row Labels 10/07/2022    4:21 PM 11/09/2020    8:47 AM 08/03/2019    4:48 PM  MMSE - Mini Mental State Exam   Section Header. No data exists in this row.     Orientation to time   '5 5 5  '$ Orientation to Place   '5 5 5  '$ Registration   '3 3 3  '$ Attention/ Calculation   '5 5 5  '$ Recall   '3 3 3  '$ Language- name 2 objects   '2 2 2  '$ Language- repeat   '1 1 1  '$ Language- follow 3 step command   '3 3 3  '$ Language- read & follow direction   '1 1 1  '$ Write a sentence   '1 1 1  '$ Copy design   '1 1 1  '$ Total score   '30 30 30      '$ Activities of Daily Living   Row Labels 09/18/2022    1:54 PM 04/24/2022    2:29 PM  In your present state of health, do you have any difficulty performing the following activities:   Section Header. No data exists in this row.    Hearing?   1 1  Vision?   0 0  Difficulty concentrating or making decisions?   1 1  Walking or climbing stairs?   1 1  Dressing or bathing?   1 1  Doing errands, shopping?   0 0    Patient Care Team: Ronnell Freshwater, NP as PCP - General (Family Medicine)       Assessment:  1. Encounter for Medicare annual wellness exam Annual medicare wellness visit today   2. Mild intermittent asthma without complication Use rescue inhaler as needed and as prescribed  - albuterol (VENTOLIN HFA) 108 (90 Base) MCG/ACT inhaler; Inhale 2 puffs into the lungs every 6 (six) hours as needed for wheezing or shortness of breath.  Dispense: 54 g; Refill: 3  3. Generalized anxiety disorder May continue alprazolam 1 mg, taking 0.5 to 1 mg daily as needed for acute anxiety.  - ALPRAZolam (XANAX) 1 MG tablet; TAKE ONE-HALF TO ONE TABLET BY MOUTH ONE TIME DAILY AS NEEDED FOR ANXIETY  Dispense: 30 tablet; Refill: 3  4. Primary insomnia Mya take ambien 10 mg at bedtime as needed.  - zolpidem (AMBIEN) 10 MG tablet; Take 1 tablet (10 mg total) by mouth at bedtime as needed. for sleep  Dispense: 30 tablet; Refill: 3  5. BMI 40.0-44.9, adult (HCC) Discussed lowering calorie intake to 1500 calories per day and incorporating exercise into daily routine to help lose weight.   6. Need for influenza vaccination Flu vaccine administered during today's visit.  - Flu Vaccine QUAD 6+ mos PF IM (Fluarix Quad PF)   Hearing/Vision screen No results found.   Depression Screen   Row Labels 09/18/2022    1:53 PM 04/24/2022    2:29 PM 01/25/2022   10:12 AM 01/09/2022   11:36 AM 11/28/2021    2:10 PM 08/29/2021   10:48 AM 06/23/2021    9:33 AM  PHQ 2/9 Scores   Section Header. No data exists in this row.         PHQ - 2 Score   0 '2 2 2 2 2 '$ 0  PHQ- 9 Score   7 6  $'7 8 7 6 3    'T$ Fall Risk   Row Labels 01/25/2022   10:12 AM 01/09/2022   11:37 AM 11/28/2021    2:09 PM 08/29/2021   10:48 AM 06/23/2021    9:34 AM  Fall Risk    Section Header. No data exists in this row.       Falls in the past year?   1 0 '1 1 1  '$ Number falls in past yr:   0 0 1 0 0  Injury with Fall?   0 0 1 0 0  Risk for fall due to :     No Fall Risks    Follow up    Falls evaluation completed Falls evaluation completed  Falls evaluation completed Falls evaluation completed    [place]:  Any stairs in or around the home? Yes  If so, are there any without handrails? No  Home free of loose throw rugs in walkways, pet beds, electrical cords, etc? Yes  Adequate lighting in your home to reduce risk of falls? Yes   ASSISTIVE DEVICES UTILIZED TO PREVENT FALLS:  Life alert? No  Use of a cane, walker or w/c? Yes  Grab bars in the bathroom? Yes  Shower chair or bench in shower? Yes  Elevated toilet seat or a handicapped toilet? Yes   TIMED UP AND GO:  Was the test performed? Yes .  Length of time to ambulate 10 feet: 10 sec.   Gait steady and fast without use of assistive device  Cognitive Function:   Row Labels 10/07/2022    4:21 PM 11/09/2020    8:47 AM 08/03/2019    4:48 PM  MMSE - Mini Mental State Exam   Section Header. No data exists in this row.     Orientation to time   '5 5 5  '$ Orientation to Place   '5 5 5  '$ Registration   '3 3 3  '$ Attention/ Calculation   '5 5 5  '$ Recall   '3 3 3  '$ Language- name 2 objects   '2 2 2  '$ Language- repeat   '1 1 1  '$ Language- follow 3 step command   '3 3 3  '$ Language- read & follow direction   '1 1 1  '$ Write a sentence   '1 1 1  '$ Copy design   '1 1 1  '$ Total score   '30 30 30       '$ Row Labels 09/18/2022    2:03 PM 08/29/2021   10:51 AM  6CIT Screen   Section Header. No data exists in this row.    What Year?   0 points 0 points  What month?   0 points 0 points  What time?   0 points 0 points  Count back from 20   0 points 0 points  Months in reverse   0 points 0 points  Repeat phrase   0 points 0 points  Total Score   0 points 0 points    Immunizations Immunization History  Administered Date(s) Administered   Influenza Inj Mdck Quad Pf 11/09/2020   Influenza,inj,Quad PF,6+ Mos 10/04/2017, 11/10/2018, 09/30/2019, 09/18/2022   Influenza-Unspecified 10/28/2017, 09/17/2019    Janssen (J&J) SARS-COV-2 Vaccination 03/30/2020   Moderna Sars-Covid-2 Vaccination 12/05/2020   Tdap 08/29/2021    TDAP status: Up to date  Flu Vaccine status: Due, Education has been provided regarding the importance of this vaccine. Advised may receive this vaccine at local pharmacy or Health Dept.  Aware to provide a copy of the vaccination record if obtained from local pharmacy or Health Dept. Verbalized acceptance and understanding.  Pneumococcal vaccine status: Due, Education has been provided regarding the importance of this vaccine. Advised may receive this vaccine at local pharmacy or Health Dept. Aware to provide a copy of the vaccination record if obtained from local pharmacy or Health Dept. Verbalized acceptance and understanding.  Covid-19 vaccine status: Completed vaccines  Qualifies for Shingles Vaccine? Yes   Zostavax completed No   Shingrix Completed?: No.    Education has been provided regarding the importance of this vaccine. Patient has been advised to call insurance company to determine out of pocket expense if they have not yet received this vaccine. Advised may also receive vaccine at local pharmacy or Health Dept. Verbalized acceptance and understanding.  Screening Tests Health Maintenance  Topic Date Due   HIV Screening  Never done   COVID-19 Vaccine (3 - Janssen risk series) 01/30/2021   Zoster Vaccines- Shingrix (1 of 2) 12/19/2022 (Originally 01/19/1980)   Hepatitis C Screening  01/25/2023 (Originally 01/18/1979)   COLONOSCOPY (Pts 45-23yr Insurance coverage will need to be confirmed)  05/29/2026   TETANUS/TDAP  08/30/2031   INFLUENZA VACCINE  Completed   HPV VACCINES  Aged Out   MAMMOGRAM  Discontinued   PAP SMEAR-Modifier  Discontinued    Health Maintenance  Health Maintenance Due  Topic Date Due   HIV Screening  Never done   COVID-19 Vaccine (3 - Janssen risk series) 01/30/2021    Colorectal cancer screening: Type of screening: Colonoscopy. Completed  2018. Repeat every 5 years  Mammogram status: No longer required due to double mastectomy.  Bone Density status: Completed pt does not remember. Results reflect: Bone density results: NORMAL. Repeat every n/a years.  Lung Cancer Screening: (Low Dose CT Chest recommended if Age 61-80years, 30 pack-year currently smoking OR have quit w/in 15years.) does not qualify.   Lung Cancer Screening Referral: no  Additional Screening:  Hepatitis C Screening: does qualify; Completed pt declined  Vision Screening: Recommended annual ophthalmology exams for early detection of glaucoma and other disorders of the eye. Is the patient up to date with their annual eye exam?  Yes  Who is the provider or what is the name of the [facility] in which the patient attends annual [facility] exams[facility] pt is not established with a provider, would they like to be referred to a provider to establish care? No .   Dental Screening: Recommended annual dental exams for proper oral hygiene  Community Resource Referral / Chronic Care Management: CRR required this visit?  No   CCM required this visit?  No      Plan:     I have personally reviewed and noted the following in the patient's chart:   Medical and social history Use of alcohol, tobacco or illicit drugs  Current medications and supplements including opioid prescriptions. Patient is not currently taking opioid prescriptions. Functional ability and status Nutritional status Physical activity Advanced directives List of other physicians Hospitalizations, surgeries, and ER visits in previous 12 months Vitals Screenings to include cognitive, depression, and falls Referrals and appointments  In addition, I have reviewed and discussed with patient certain preventive protocols, quality metrics, and best practice recommendations. A written personalized care plan for preventive services as well as general preventive health recommendations were  provided to patient.  I connected with Janet Austin on 10/07/22  face to face  that I am  speaking with the correct person using two identifiers.   I discussed the limitations, risks, security and privacy concerns of performing an evaluation and management service by telephone and the availability of in person appointments. I also discussed with the patient responsible charge related to this service. The patient expressed understanding and verbally consented to this telephonic visit.   Locations of Patient:in office  Location of [place] office  List any persons and their role that are participating in this visit with patient:    Ronnell Freshwater, NP   10/07/2022   Nurse Notes: 25 min face to face

## 2022-09-21 DIAGNOSIS — G4733 Obstructive sleep apnea (adult) (pediatric): Secondary | ICD-10-CM | POA: Diagnosis not present

## 2022-10-18 DIAGNOSIS — H40053 Ocular hypertension, bilateral: Secondary | ICD-10-CM | POA: Diagnosis not present

## 2022-10-22 DIAGNOSIS — G4733 Obstructive sleep apnea (adult) (pediatric): Secondary | ICD-10-CM | POA: Diagnosis not present

## 2022-11-05 ENCOUNTER — Other Ambulatory Visit: Payer: Self-pay | Admitting: Nurse Practitioner

## 2022-11-05 DIAGNOSIS — R062 Wheezing: Secondary | ICD-10-CM

## 2022-11-05 DIAGNOSIS — J0140 Acute pansinusitis, unspecified: Secondary | ICD-10-CM

## 2022-11-05 MED ORDER — AMOXICILLIN-POT CLAVULANATE 875-125 MG PO TABS: 1.0000 | ORAL_TABLET | Freq: Two times a day (BID) | ORAL | 0 refills | Status: DC

## 2022-11-05 MED ORDER — PREDNISONE 10 MG (21) PO TBPK: ORAL_TABLET | ORAL | 0 refills | Status: DC

## 2022-11-07 ENCOUNTER — Telehealth: Payer: Medicare Other | Admitting: Nurse Practitioner

## 2022-11-11 ENCOUNTER — Ambulatory Visit
Admission: RE | Admit: 2022-11-11 | Discharge: 2022-11-11 | Disposition: A | Discharge status: Home / Self Care | Payer: Medicare Other | Source: Ambulatory Visit | Attending: Family Medicine | Admitting: Family Medicine

## 2022-11-11 ENCOUNTER — Ambulatory Visit (INDEPENDENT_AMBULATORY_CARE_PROVIDER_SITE_OTHER): Payer: Medicare Other

## 2022-11-11 VITALS — BP 147/88 | HR 99 | Temp 98.1°F | Resp 15 | Ht 64.0 in | Wt 243.2 lb

## 2022-11-11 DIAGNOSIS — R059 Cough, unspecified: Secondary | ICD-10-CM

## 2022-11-11 DIAGNOSIS — R509 Fever, unspecified: Secondary | ICD-10-CM

## 2022-11-11 DIAGNOSIS — Z853 Personal history of malignant neoplasm of breast: Secondary | ICD-10-CM | POA: Diagnosis not present

## 2022-11-11 DIAGNOSIS — Z1152 Encounter for screening for COVID-19: Secondary | ICD-10-CM | POA: Insufficient documentation

## 2022-11-11 DIAGNOSIS — J988 Other specified respiratory disorders: Secondary | ICD-10-CM

## 2022-11-11 DIAGNOSIS — Z9013 Acquired absence of bilateral breasts and nipples: Secondary | ICD-10-CM | POA: Insufficient documentation

## 2022-11-11 LAB — RESP PANEL BY RT-PCR (FLU A&B, COVID) ARPGX2
Influenza A by PCR: NEGATIVE
Influenza B by PCR: NEGATIVE
SARS Coronavirus 2 by RT PCR: NEGATIVE

## 2022-11-11 MED ORDER — PROMETHAZINE-DM 6.25-15 MG/5ML PO SYRP: 5.0000 mL | ORAL_SOLUTION | Freq: Four times a day (QID) | ORAL | 0 refills | Status: DC | PRN

## 2022-11-11 NOTE — ED Triage Notes (Addendum)
Patient c/o cough, chest congestion, fever, and headache for the past 7 days.  Patient currently taking Augmentin and Prednisone prescribed by her PCP.

## 2022-11-11 NOTE — Discharge Instructions (Signed)
Chest x-ray negative.  Medication as prescribed.  If your test is positive we will call.  Continue your current antibiotic course.

## 2022-11-11 NOTE — ED Provider Notes (Signed)
[facility]    CSN: [id number] Arrival date & time: 11/11/22  1338      History   Chief Complaint Chief Complaint  Patient presents with   Cough    HPI 61 year old female presents for evaluation of the above.  Patient recently placed on Augmentin and prednisone by her primary care NP.  She is currently still on these medications.  She reports that her cough seems to be worsening.  Associated congestion and bodyaches.  She has recently coming in contact with someone with influenza.  Currently afebrile.  No shortness of breath.  No other complaints or concerns at this time.  Past Medical History:  Diagnosis Date   Anxiety    Arthritis    Asthma    Phreesia 02/13/2021   Cancer ([place])    left breast   Headache    migraines   Hyperlipidemia    [employer]reesia 02/13/2021   PONV (postoperative nausea and vomiting)    Sleep apnea     Patient Active Problem List   Diagnosis Date Noted   BMI 40.0-44.9, adult ([place]) 01/31/2022   Encounter for Medicare annual wellness exam 08/29/2021   Elevated blood pressure reading without diagnosis of hypertension 03/27/2021   Eczema 02/14/2021   Healthcare maintenance 02/14/2021   History of breast cancer in female 11/09/2020   Daytime hypersomnolence 08/24/2020   Encounter for long-term (current) use of medications 11/15/2019   Seasonal allergic rhinitis due to pollen 08/03/2019   Mild intermittent asthma without complication 27/02/5008   Mixed hyperlipidemia 07/27/2018   Generalized anxiety disorder 03/11/2018   Primary insomnia 03/11/2018   Malignant neoplasm of unspecified site of unspecified female breast ([place]) 12/29/2016    Past Surgical History:  Procedure Laterality Date   ABDOMINAL HYSTERECTOMY     BREAST SURGERY     double mastectomy   BREAST SURGERY     reconstruction   COLONOSCOPY WITH PROPOFOL N/A 05/29/2016   Procedure: COLONOSCOPY WITH PROPOFOL;  Surgeon: Lollie Sails, MD;  Location: [facility] ENDOSCOPY;   Service: Endoscopy;  Laterality: N/A;   KNEE ARTHROSCOPY Right    MASTECTOMY      OB History   No obstetric history on file.      Home Medications    Prior to Admission medications   Medication Sig Start Date End Date Taking? Authorizing Provider  amoxicillin-clavulanate (AUGMENTIN) 875-125 MG tablet Take 1 tablet by mouth 2 (two) times daily. 11/05/22  Yes Boscia, Greer Ee, NP  predniSONE (STERAPRED UNI-PAK 21 TAB) 10 MG (21) TBPK tablet 6 day taper - take by mouth as directed for 6 days 11/05/22   Ronnell Freshwater, NP  promethazine-dextromethorphan (PROMETHAZINE-DM) 6.25-15 MG/5ML syrup Take 5 mLs by mouth 4 (four) times daily as needed for cough. 11/11/22  Yes Alois Colgan G, DO  albuterol (VENTOLIN HFA) 108 (90 Base) MCG/ACT inhaler Inhale 2 puffs into the lungs every 6 (six) hours as needed for wheezing or shortness of breath. 09/18/22   Ronnell Freshwater, NP  ALPRAZolam (XANAX) 1 MG tablet TAKE ONE-HALF TO ONE TABLET BY MOUTH ONE TIME DAILY AS NEEDED FOR ANXIETY 09/18/22   Ronnell Freshwater, NP  atorvastatin (LIPITOR) 10 MG tablet Take 1 tablet (10 mg total) by mouth daily. 01/25/22   Ronnell Freshwater, NP  clobetasol ointment (TEMOVATE) 3.81 % Apply 1 application topically daily as needed. 11/03/18   Kendell Bane, NP  SYMBICORT 160-4.5 MCG/ACT inhaler INHALE 2 PUFFS INTO THE LUNGS TWICE A DAY 08/06/22   Kasa,  Maretta Bees, MD  triamcinolone ointment (KENALOG) 0.5 % Apply 1 application topically 2 (two) times daily. 02/14/21   Ronnell Freshwater, NP  valACYclovir (VALTREX) 1000 MG tablet Take 1 tablet (1,000 mg total) by mouth 2 (two) times daily. 02/12/20   Ronnell Freshwater, NP  Vitamin D, Ergocalciferol, (DRISDOL) 1.25 MG (50000 UNIT) CAPS capsule TAKE ONE CAPSULE BY MOUTH EVERY WEEK 07/24/22   Ronnell Freshwater, NP  zolpidem (AMBIEN) 10 MG tablet Take 1 tablet (10 mg total) by mouth at bedtime as needed. for sleep 09/18/22   Ronnell Freshwater, NP    Family History Family History  Problem  Relation Age of Onset   Hypertension Father     Social History Social History   Tobacco Use   Smoking status: Never   Smokeless tobacco: Never  Vaping Use   Vaping Use: Never used  Substance Use Topics   Alcohol use: No   Drug use: No     Allergies   Other, Asa [aspirin], Gabapentin, Lyrica [pregabalin], and Tape   Review of Systems Review of Systems  Respiratory:  Positive for cough.    Physical Exam Triage Vital Signs ED Triage Vitals  Enc Vitals Group     BP 11/11/22 1347 (!) 147/88     Pulse Rate 11/11/22 1347 99     Resp 11/11/22 1347 15     Temp 11/11/22 1347 98.1 F (36.7 C)     Temp Source 11/11/22 1347 Oral     SpO2 11/11/22 1347 96 %     Weight 11/11/22 1346 243 lb 2.7 oz (110.3 kg)     Height 11/11/22 1346 '5\' 4"'$  (1.626 m)     Head Circumference --      Peak Flow --      Pain Score 11/11/22 1345 5     Pain Loc --      Pain Edu? --      Excl. in [place]? --    Updated Vital Signs BP (!) 147/88 (BP Location: Left Arm)   Pulse 99   Temp 98.1 F (36.7 C) (Oral)   Resp 15   Ht '5\' 4"'$  (1.626 m)   Wt 110.3 kg   SpO2 96%   BMI 41.74 kg/m   Visual Acuity Right Eye Distance:   Left Eye Distance:   Bilateral Distance:    Right Eye Near:   Left Eye Near:    Bilateral Near:     Physical Exam Vitals and nursing note reviewed.  Constitutional:      General: She is not in acute distress.    Appearance: She is obese.  HENT:     Head: Normocephalic and atraumatic.  Eyes:     General:        Right eye: No discharge.        Left eye: No discharge.     Conjunctiva/sclera: Conjunctivae normal.  Cardiovascular:     Rate and Rhythm: Normal rate and regular rhythm.  Pulmonary:     Effort: Pulmonary effort is normal.     Breath sounds: Normal breath sounds. No wheezing, rhonchi or rales.  Neurological:     Mental Status: She is alert.  Psychiatric:        Mood and Affect: Mood normal.        Behavior: Behavior normal.      UC Treatments / Results   Labs (all labs ordered are listed, but only abnormal results are displayed) Labs Reviewed  RESP PANEL BY RT-PCR (FLU  A&B, COVID) [place]    EKG   Radiology DG Chest 2 View  Result Date: 11/11/2022 CLINICAL DATA:  Cough with congestion and fever. EXAM: CHEST - 2 VIEW COMPARISON:  10/05/2020 FINDINGS: The lungs are clear without focal pneumonia, edema, pneumothorax or pleural effusion. The cardiopericardial silhouette is within normal limits for size. The visualized bony structures of the thorax are unremarkable. IMPRESSION: No active cardiopulmonary disease. Electronically Signed   By: Misty Stanley M.D.   On: 11/11/2022 15:01    Procedures Procedures (including critical care time)  Medications Ordered in UC Medications - No data to display  Initial Impression / Assessment and Plan / UC Course  I have reviewed the triage vital signs and the nursing notes.  Pertinent labs & imaging results that were available during my care of the patient were reviewed by me and considered in my medical decision making (see chart for details).    61 year old female presents with respiratory infection.  Chest x-ray was obtained and was independently reviewed by me.  Interpretation: Normal chest x-ray.  No acute abnormalities. Advised that patient can continue her medications as prescribed by her primary care office.  COVID and influenza testing negative.  Promethazine DM for cough.  Supportive care.  Final Clinical Impressions(s) / UC Diagnoses   Final diagnoses:  Respiratory infection     Discharge Instructions      Chest x-ray negative.  Medication as prescribed.  If your test is positive we will call.  Continue your current antibiotic course.   ED Prescriptions     Medication Sig Dispense Auth. Provider   promethazine-dextromethorphan (PROMETHAZINE-DM) 6.25-15 MG/5ML syrup Take 5 mLs by mouth 4 (four) times daily as needed for cough. 118 mL Coral Spikes, DO      PDMP not  reviewed this encounter.   Coral Spikes, [place] 11/11/22 1707

## 2022-11-16 ENCOUNTER — Other Ambulatory Visit: Payer: Self-pay | Admitting: Nurse Practitioner

## 2022-11-16 DIAGNOSIS — E782 Mixed hyperlipidemia: Secondary | ICD-10-CM

## 2022-11-21 DIAGNOSIS — M1711 Unilateral primary osteoarthritis, right knee: Secondary | ICD-10-CM | POA: Diagnosis not present

## 2022-11-21 DIAGNOSIS — M25561 Pain in right knee: Secondary | ICD-10-CM | POA: Diagnosis not present

## 2022-12-04 ENCOUNTER — Ambulatory Visit: Payer: Medicare Other

## 2022-12-04 DIAGNOSIS — J101 Influenza due to other identified influenza virus with other respiratory manifestations: Secondary | ICD-10-CM | POA: Diagnosis not present

## 2022-12-04 DIAGNOSIS — R059 Cough, unspecified: Secondary | ICD-10-CM | POA: Diagnosis not present

## 2022-12-19 DIAGNOSIS — K080 Exfoliation of teeth due to systemic causes: Secondary | ICD-10-CM | POA: Diagnosis not present

## 2023-01-08 DIAGNOSIS — K080 Exfoliation of teeth due to systemic causes: Secondary | ICD-10-CM | POA: Diagnosis not present

## 2023-01-17 ENCOUNTER — Telehealth: Payer: Self-pay | Admitting: *Deleted

## 2023-01-17 NOTE — Telephone Encounter (Signed)
Pt called to cancel appointment and to inform provider that husband was in hospital. Informed provider verbally of this information.  Halynn Reitano Zimmerman Rumple, CMA

## 2023-01-22 ENCOUNTER — Ambulatory Visit: Payer: Medicare Other | Admitting: Nurse Practitioner

## 2023-01-29 ENCOUNTER — Other Ambulatory Visit: Payer: Self-pay | Admitting: Nurse Practitioner

## 2023-01-29 DIAGNOSIS — E782 Mixed hyperlipidemia: Secondary | ICD-10-CM

## 2023-01-29 NOTE — Telephone Encounter (Signed)
Pt scheduled to come in on Thursday 01/31/23

## 2023-01-30 DIAGNOSIS — K080 Exfoliation of teeth due to systemic causes: Secondary | ICD-10-CM | POA: Diagnosis not present

## 2023-01-31 ENCOUNTER — Encounter: Payer: Self-pay | Admitting: Nurse Practitioner

## 2023-01-31 ENCOUNTER — Telehealth (INDEPENDENT_AMBULATORY_CARE_PROVIDER_SITE_OTHER): Payer: Medicare Other | Admitting: Nurse Practitioner

## 2023-01-31 DIAGNOSIS — R062 Wheezing: Secondary | ICD-10-CM

## 2023-01-31 DIAGNOSIS — F411 Generalized anxiety disorder: Secondary | ICD-10-CM | POA: Diagnosis not present

## 2023-01-31 DIAGNOSIS — F5101 Primary insomnia: Secondary | ICD-10-CM | POA: Diagnosis not present

## 2023-01-31 DIAGNOSIS — J0140 Acute pansinusitis, unspecified: Secondary | ICD-10-CM | POA: Diagnosis not present

## 2023-01-31 MED ORDER — PREDNISONE 10 MG (21) PO TBPK: ORAL_TABLET | ORAL | 0 refills | Status: DC

## 2023-01-31 MED ORDER — AZITHROMYCIN 250 MG PO TABS: ORAL_TABLET | ORAL | 0 refills | Status: DC

## 2023-01-31 MED ORDER — ALPRAZOLAM 1 MG PO TABS: ORAL_TABLET | ORAL | 3 refills | Status: DC

## 2023-01-31 MED ORDER — ZOLPIDEM TARTRATE 10 MG PO TABS: 10.0000 mg | ORAL_TABLET | Freq: Every evening | ORAL | 3 refills | Status: DC | PRN

## 2023-01-31 NOTE — Progress Notes (Signed)
Virtual Visit via Telephone Note  I connected with Janet Austin on 03/07/23 at  1:50 PM EST by telephone and verified that I am speaking with the correct person using two identifiers.  Location: Patient: home Provider: [place] [facility]     I discussed the limitations, risks, security and privacy concerns of performing an evaluation and management service by telephone and the availability of in person appointments. I also discussed with the patient that there may be a patient responsible charge related to this service. The patient expressed understanding and agreed to proceed.   History of Present Illness: Sinus infection -congestion -headache -sore throat -cough --negative home COVID test.  -denies fever    Observations/Objective: Today's Vitals   01/31/23 1353  Temp: 100 F (37.8 C)  TempSrc: Oral  Weight: 240 lb (108.9 kg)  Height: 5\' 4"  (1.626 m)  PainSc: 6   PainLoc: Back   Body mass index is 41.2 kg/m.    The patient is alert and oriented. She is pleasant and answers all questions appropriately. Breathing is non-labored. She is in no acute distress at this time.  She sounds congested. Has dry, harsh sounding cough.   Assessment and Plan: 1. Acute non-recurrent pansinusitis Start z-pack. Take as directed for 5 days. Rest and increase fluids. Continue using OTC medication to control symptoms.   - azithromycin (ZITHROMAX) 250 MG tablet; z-pack - take as directed for 5 days  Dispense: 6 tablet; Refill: 0  2. Wheezing Add prednisone taper. Take as directed for 6 days. Use inhalers and respiratory medication as needed and as prescribed.  - predniSONE (STERAPRED UNI-PAK 21 TAB) 10 MG (21) TBPK tablet; 6 day taper - take by mouth as directed for 6 days  Dispense: 21 tablet; Refill: 0  3. Generalized anxiety disorder May continue alprazolam daily as needed for acute anxiety  - ALPRAZolam (XANAX) 1 MG tablet; TAKE ONE-HALF TO ONE TABLET BY MOUTH ONE  TIME DAILY AS NEEDED FOR ANXIETY  Dispense: 30 tablet; Refill: 3  4. Primary insomnia May continue to take ambien 10 mg at bedtime as needed.  - zolpidem (AMBIEN) 10 MG tablet; Take 1 tablet (10 mg total) by mouth at bedtime as needed. for sleep  Dispense: 30 tablet; Refill: 3   Follow Up Instructions:    I discussed the assessment and treatment plan with the patient. The patient was provided an opportunity to ask questions and all were answered. The patient agreed with the plan and demonstrated an understanding of the instructions.   The patient was advised to call back or seek an in-person evaluation if the symptoms worsen or if the condition fails to improve as anticipated.  I provided 10 minutes of non-face-to-face time during this encounter.   Ronnell Freshwater, NP

## 2023-03-07 ENCOUNTER — Encounter: Payer: Self-pay | Admitting: Nurse Practitioner

## 2023-03-07 DIAGNOSIS — R062 Wheezing: Secondary | ICD-10-CM | POA: Insufficient documentation

## 2023-03-20 DIAGNOSIS — M1711 Unilateral primary osteoarthritis, right knee: Secondary | ICD-10-CM | POA: Diagnosis not present

## 2023-03-20 DIAGNOSIS — M25551 Pain in right hip: Secondary | ICD-10-CM | POA: Diagnosis not present

## 2023-03-20 DIAGNOSIS — S7001XA Contusion of right hip, initial encounter: Secondary | ICD-10-CM | POA: Diagnosis not present

## 2023-04-03 DIAGNOSIS — K080 Exfoliation of teeth due to systemic causes: Secondary | ICD-10-CM | POA: Diagnosis not present

## 2023-04-24 ENCOUNTER — Other Ambulatory Visit: Payer: Self-pay | Admitting: Nurse Practitioner

## 2023-04-24 DIAGNOSIS — E782 Mixed hyperlipidemia: Secondary | ICD-10-CM

## 2023-04-24 DIAGNOSIS — E559 Vitamin D deficiency, unspecified: Secondary | ICD-10-CM

## 2023-05-02 DIAGNOSIS — H43812 Vitreous degeneration, left eye: Secondary | ICD-10-CM | POA: Diagnosis not present

## 2023-05-02 DIAGNOSIS — H40053 Ocular hypertension, bilateral: Secondary | ICD-10-CM | POA: Diagnosis not present

## 2023-05-02 DIAGNOSIS — H43811 Vitreous degeneration, right eye: Secondary | ICD-10-CM | POA: Diagnosis not present

## 2023-05-10 DIAGNOSIS — H40053 Ocular hypertension, bilateral: Secondary | ICD-10-CM | POA: Diagnosis not present

## 2023-05-10 DIAGNOSIS — H43811 Vitreous degeneration, right eye: Secondary | ICD-10-CM | POA: Diagnosis not present

## 2023-05-16 DIAGNOSIS — H40053 Ocular hypertension, bilateral: Secondary | ICD-10-CM | POA: Diagnosis not present

## 2023-05-16 DIAGNOSIS — H43812 Vitreous degeneration, left eye: Secondary | ICD-10-CM | POA: Diagnosis not present

## 2023-05-16 DIAGNOSIS — H43811 Vitreous degeneration, right eye: Secondary | ICD-10-CM | POA: Diagnosis not present

## 2023-05-24 ENCOUNTER — Other Ambulatory Visit: Payer: Self-pay | Admitting: Nurse Practitioner

## 2023-05-24 DIAGNOSIS — E559 Vitamin D deficiency, unspecified: Secondary | ICD-10-CM

## 2023-05-27 DIAGNOSIS — G4733 Obstructive sleep apnea (adult) (pediatric): Secondary | ICD-10-CM | POA: Diagnosis not present

## 2023-05-27 DIAGNOSIS — Z6841 Body Mass Index (BMI) 40.0 and over, adult: Secondary | ICD-10-CM | POA: Diagnosis not present

## 2023-05-27 DIAGNOSIS — E785 Hyperlipidemia, unspecified: Secondary | ICD-10-CM | POA: Diagnosis not present

## 2023-05-27 DIAGNOSIS — J45909 Unspecified asthma, uncomplicated: Secondary | ICD-10-CM | POA: Diagnosis not present

## 2023-05-27 DIAGNOSIS — D8481 Immunodeficiency due to conditions classified elsewhere: Secondary | ICD-10-CM | POA: Diagnosis not present

## 2023-05-27 DIAGNOSIS — R32 Unspecified urinary incontinence: Secondary | ICD-10-CM | POA: Diagnosis not present

## 2023-06-04 ENCOUNTER — Ambulatory Visit (INDEPENDENT_AMBULATORY_CARE_PROVIDER_SITE_OTHER): Payer: Medicare Other | Admitting: Nurse Practitioner

## 2023-06-04 ENCOUNTER — Encounter: Payer: Self-pay | Admitting: Nurse Practitioner

## 2023-06-04 VITALS — BP 130/83 | HR 79 | Ht 64.0 in | Wt 243.4 lb

## 2023-06-04 DIAGNOSIS — F5101 Primary insomnia: Secondary | ICD-10-CM | POA: Diagnosis not present

## 2023-06-04 DIAGNOSIS — F411 Generalized anxiety disorder: Secondary | ICD-10-CM

## 2023-06-04 DIAGNOSIS — E782 Mixed hyperlipidemia: Secondary | ICD-10-CM

## 2023-06-04 DIAGNOSIS — R7301 Impaired fasting glucose: Secondary | ICD-10-CM | POA: Diagnosis not present

## 2023-06-04 DIAGNOSIS — E559 Vitamin D deficiency, unspecified: Secondary | ICD-10-CM

## 2023-06-04 DIAGNOSIS — R5383 Other fatigue: Secondary | ICD-10-CM

## 2023-06-04 MED ORDER — ZOLPIDEM TARTRATE 10 MG PO TABS: 10.0000 mg | ORAL_TABLET | Freq: Every evening | ORAL | 3 refills | Status: DC | PRN

## 2023-06-04 MED ORDER — ALPRAZOLAM 1 MG PO TABS: ORAL_TABLET | ORAL | 3 refills | Status: DC

## 2023-06-04 MED ORDER — ATORVASTATIN CALCIUM 10 MG PO TABS: 10.0000 mg | ORAL_TABLET | Freq: Every day | ORAL | 0 refills | Status: DC

## 2023-06-04 NOTE — Progress Notes (Signed)
Established patient visit   Patient: Janet Austin   DOB: 1961-06-22   62 y.o. Female  MRN: [id number] Visit Date: 06/04/2023   Chief Complaint  Patient presents with   Medical Management of Chronic Issues   Subjective    HPI  Follow up -breast cancer survivor  -generalized anxiety --takes alprazolam 1 mg, 1/2 to 1 tablet po daily when needed for acute anxiety  -trouble sleeping.  --uses ambien at bedtime to help with sleep  -reviewed PDMP profile today 06/04/2023.  --accidental overdose risk score is below average at 200.  --fill history is appropriate.  -history of asthma and seasonal allergies.   -husband is current undergoing treatment for cancer and is in remission.  -she has no new concerns or complaints -denies chest pain, chest pressure, or shortness of breath. He denies headaches or visual disturbances. He denies abdominal pain, nausea, vomiting, or changes in bowel or bladder habits.  .       Medications: Outpatient Medications Prior to Visit  Medication Sig   albuterol (VENTOLIN HFA) 108 (90 Base) MCG/ACT inhaler Inhale 2 puffs into the lungs every 6 (six) hours as needed for wheezing or shortness of breath.   clobetasol ointment (TEMOVATE) 0.05 % Apply 1 application topically daily as needed.   SYMBICORT 160-4.5 MCG/ACT inhaler INHALE 2 PUFFS INTO THE LUNGS TWICE A DAY   triamcinolone ointment (KENALOG) 0.5 % Apply 1 application topically 2 (two) times daily.   valACYclovir (VALTREX) 1000 MG tablet Take 1 tablet (1,000 mg total) by mouth 2 (two) times daily.   [DISCONTINUED] ALPRAZolam (XANAX) 1 MG tablet TAKE ONE-HALF TO ONE TABLET BY MOUTH ONE TIME DAILY AS NEEDED FOR ANXIETY   [DISCONTINUED] atorvastatin (LIPITOR) 10 MG tablet TAKE ONE TABLET BY MOUTH ONE TIME DAILY   [DISCONTINUED] zolpidem (AMBIEN) 10 MG tablet Take 1 tablet (10 mg total) by mouth at bedtime as needed. for sleep   [DISCONTINUED] amoxicillin-clavulanate (AUGMENTIN) 875-125 MG tablet Take 1  tablet by mouth 2 (two) times daily. (Patient not taking: Reported on 01/31/2023)   [DISCONTINUED] azithromycin (ZITHROMAX) 250 MG tablet z-pack - take as directed for 5 days   [DISCONTINUED] predniSONE (STERAPRED UNI-PAK 21 TAB) 10 MG (21) TBPK tablet 6 day taper - take by mouth as directed for 6 days   [DISCONTINUED] promethazine-dextromethorphan (PROMETHAZINE-DM) 6.25-15 MG/5ML syrup Take 5 mLs by mouth 4 (four) times daily as needed for cough. (Patient not taking: Reported on 01/31/2023)   [DISCONTINUED] Vitamin D, Ergocalciferol, (DRISDOL) 1.25 MG (50000 UNIT) CAPS capsule TAKE ONE CAPSULE BY MOUTH EVERY WEEK (Patient not taking: Reported on 01/31/2023)   No facility-administered medications prior to visit.    Review of Systems See HPI      Objective     Today's Vitals   06/04/23 1312 06/04/23 1406  BP: 135/85 130/83  Pulse: 84 79  SpO2: 95%   Weight: 243 lb 6.4 oz (110.4 kg)   Height: 5\' 4"  (1.626 m)    Body mass index is 41.78 kg/m.  BP Readings from Last 3 Encounters:  06/04/23 130/83  11/11/22 (Abnormal) 147/88  09/18/22 125/73    Wt Readings from Last 3 Encounters:  06/13/23 243 lb (110.2 kg)  06/04/23 243 lb 6.4 oz (110.4 kg)  01/31/23 240 lb (108.9 kg)    Physical Exam Vitals and nursing note reviewed.  Constitutional:      Appearance: Normal appearance. She is well-developed.  HENT:     Head: Normocephalic and atraumatic.     Nose: Nose  normal.     Mouth/Throat:     Mouth: Mucous membranes are moist.     Pharynx: Oropharynx is clear.  Eyes:     Extraocular Movements: Extraocular movements intact.     Conjunctiva/sclera: Conjunctivae normal.     Pupils: Pupils are equal, round, and reactive to light.  Neck:     Vascular: No carotid bruit.  Cardiovascular:     Rate and Rhythm: Normal rate and regular rhythm.     Pulses: Normal pulses.     Heart sounds: Normal heart sounds.  Pulmonary:     Effort: Pulmonary effort is normal.     Breath sounds: Normal  breath sounds.  Abdominal:     Palpations: Abdomen is soft.  Musculoskeletal:        General: Normal range of motion.     Cervical back: Normal range of motion and neck supple.  Lymphadenopathy:     Cervical: No cervical adenopathy.  Skin:    General: Skin is warm and dry.     Capillary Refill: Capillary refill takes less than 2 seconds.  Neurological:     General: No focal deficit present.     Mental Status: She is alert and oriented to person, place, and time.  Psychiatric:        Mood and Affect: Mood normal.        Behavior: Behavior normal.        Thought Content: Thought content normal.        Judgment: Judgment normal.      Assessment & Plan    Generalized anxiety disorder Assessment & Plan: May continue alprazolam 0.5 mg up to twice daily as needed for acute anxiety.  -new prescription sent to her pharmacy today    Orders: -     ALPRAZolam; TAKE ONE-HALF TO ONE TABLET BY MOUTH ONE TIME DAILY AS NEEDED FOR ANXIETY  Dispense: 30 tablet; Refill: 3  Primary insomnia Assessment & Plan: May continue zolpidem at bedtime as needed.  -new prescription sent to her pharmacy today   Orders: -     Zolpidem Tartrate; Take 1 tablet (10 mg total) by mouth at bedtime as needed. for sleep  Dispense: 30 tablet; Refill: 3  Mixed hyperlipidemia Assessment & Plan: Check fasting lipid panel and treat as indicated   Orders: -     Lipid panel; Future -     Comprehensive metabolic panel; Future -     CBC; Future -     Atorvastatin Calcium; Take 1 tablet (10 mg total) by mouth daily.  Dispense: 90 tablet; Refill: 0  Impaired fasting glucose Assessment & Plan: Check HgbA1c along with routine, fasting labs   Orders: -     Hemoglobin A1c; Future  Vitamin D deficiency Assessment & Plan: Check vitamin d level and treat deficiency as indicated.    Orders: -     VITAMIN D 25 Hydroxy (Vit-D Deficiency, Fractures); Future  Other fatigue Assessment & Plan: Check labs for further  evaluation.   Orders: -     TSH + free T4; Future -     Comprehensive metabolic panel; Future -     CBC; Future     Return in about 4 months (around 10/04/2023) for mood, med refills.         Carlean Jews, NP  [facility] at [facility] [phone] (phone) [phone] (fax)  [facility]

## 2023-06-13 ENCOUNTER — Ambulatory Visit (INDEPENDENT_AMBULATORY_CARE_PROVIDER_SITE_OTHER): Payer: Medicare Other

## 2023-06-13 VITALS — Ht 64.0 in | Wt 243.0 lb

## 2023-06-13 DIAGNOSIS — Z0000 Encounter for general adult medical examination without abnormal findings: Secondary | ICD-10-CM

## 2023-06-13 DIAGNOSIS — Z1159 Encounter for screening for other viral diseases: Secondary | ICD-10-CM

## 2023-06-13 NOTE — Addendum Note (Signed)
Addended by: Saralyn Pilar on: 06/13/2023 11:44 AM   Modules accepted: Orders

## 2023-06-13 NOTE — Progress Notes (Signed)
Subjective:   Janet Austin is a 62 y.o. female who presents for Medicare Annual (Subsequent) preventive examination.  Visit Complete: Virtual  I connected with  Janet Austin on 06/13/23 by a audio enabled telemedicine application and verified that I am speaking with the correct person using two identifiers.  Patient Location: Home  Provider Location: Home Office  I discussed the limitations of evaluation and management by telemedicine. The patient expressed understanding and agreed to proceed.  Patient Medicare AWV questionnaire was completed by the patient on ; I have confirmed that all information answered by patient is correct and no changes since this date.  Review of Systems     Cardiac Risk Factors include: advanced age (>66men, >57 women)     Objective:    Today's Vitals   06/13/23 1113  Weight: 243 lb (110.2 kg)  Height: 5\' 4"  (1.626 m)   Body mass index is 41.71 kg/m.     06/13/2023   11:21 AM 11/11/2022    1:47 PM  Advanced Directives  Does Patient Have a Medical Advance Directive? Yes No  Type of Estate agent of [place];Living will   Copy of Healthcare Power of Attorney in Chart? No - copy requested     Current Medications (verified) Outpatient Encounter Medications as of 06/13/2023  Medication Sig   albuterol (VENTOLIN HFA) 108 (90 Base) MCG/ACT inhaler Inhale 2 puffs into the lungs every 6 (six) hours as needed for wheezing or shortness of breath.   ALPRAZolam (XANAX) 1 MG tablet TAKE ONE-HALF TO ONE TABLET BY MOUTH ONE TIME DAILY AS NEEDED FOR ANXIETY   atorvastatin (LIPITOR) 10 MG tablet Take 1 tablet (10 mg total) by mouth daily.   clobetasol ointment (TEMOVATE) 0.05 % Apply 1 application topically daily as needed.   SYMBICORT 160-4.5 MCG/ACT inhaler INHALE 2 PUFFS INTO THE LUNGS TWICE A DAY   triamcinolone ointment (KENALOG) 0.5 % Apply 1 application topically 2 (two) times daily.   valACYclovir (VALTREX) 1000 MG tablet  Take 1 tablet (1,000 mg total) by mouth 2 (two) times daily.   zolpidem (AMBIEN) 10 MG tablet Take 1 tablet (10 mg total) by mouth at bedtime as needed. for sleep   [DISCONTINUED] predniSONE (STERAPRED UNI-PAK 21 TAB) 10 MG (21) TBPK tablet 6 day taper - take by mouth as directed for 6 days   No facility-administered encounter medications on file as of 06/13/2023.    Allergies (verified) Other, Asa [aspirin], Gabapentin, Lyrica [pregabalin], and Tape   History: Past Medical History:  Diagnosis Date   Anxiety    Arthritis    Asthma    Phreesia 02/13/2021   Cancer (HCC)    left breast   Headache    migraines   Hyperlipidemia    [employer]reesia 02/13/2021   PONV (postoperative nausea and vomiting)    Sleep apnea    Past Surgical History:  Procedure Laterality Date   ABDOMINAL HYSTERECTOMY     BREAST SURGERY     double mastectomy   BREAST SURGERY     reconstruction   COLONOSCOPY WITH PROPOFOL N/A 05/29/2016   Procedure: COLONOSCOPY WITH PROPOFOL;  Surgeon: Christena Deem, MD;  Location: [facility] ENDOSCOPY;  Service: Endoscopy;  Laterality: N/A;   KNEE ARTHROSCOPY Right    MASTECTOMY     Family History  Problem Relation Age of Onset   Hypertension Father    Social History   Socioeconomic History   Marital status: Married    Spouse name: Not on file  Number of children: Not on file   Years of education: Not on file   Highest education level: Not on file  Occupational History   Not on file  Tobacco Use   Smoking status: Never    Passive exposure: Never   Smokeless tobacco: Never  Vaping Use   Vaping Use: Never used  Substance and Sexual Activity   Alcohol use: No   Drug use: No   Sexual activity: Not on file  Other Topics Concern   Not on file  Social History Narrative   Not on file   Social Determinants of Health   Financial Resource Strain: Low Risk  (06/13/2023)   Overall Financial Resource Strain (CARDIA)    Difficulty of Paying Living Expenses: Not hard at  all  Food Insecurity: No Food Insecurity (06/13/2023)   Hunger Vital Sign    Worried About Running Out of Food in the Last Year: Never true    Ran Out of Food in the Last Year: Never true  Transportation Needs: No Transportation Needs (06/13/2023)   PRAPARE - Administrator, Civil Service (Medical): No    Lack of Transportation (Non-Medical): No  Physical Activity: Insufficiently Active (06/13/2023)   Exercise Vital Sign    Days of Exercise per Week: 4 days    Minutes of Exercise per Session: 10 min  Stress: No Stress Concern Present (06/13/2023)   [employer] of Occupational Health - Occupational Stress Questionnaire    Feeling of Stress : Not at all  Social Connections: Socially Integrated (06/13/2023)   Social Connection and Isolation Panel [NHANES]    Frequency of Communication with Friends and Family: More than three times a week    Frequency of Social Gatherings with Friends and Family: More than three times a week    Attends Religious Services: More than 4 times per year    Active Member of [employer] or Organizations: Yes    Attends Engineer, structural: More than 4 times per year    Marital Status: Married    Tobacco Counseling Counseling given: Not Answered   Clinical Intake:  Pre-visit preparation completed: No  Activities of Daily Living    06/13/2023   11:19 AM 09/18/2022    1:54 PM  In your present state of health, do you have any difficulty performing the following activities:  Hearing? 0 1  Vision? 0 0  Difficulty concentrating or making decisions? 0 1  Walking or climbing stairs? 0 1  Dressing or bathing? 0 1  Doing errands, shopping? 0 0  Preparing Food and eating ? N   Using the Toilet? N   In the past six months, have you accidently leaked urine? N   Do you have problems with loss of bowel control? N   Managing your Medications? N   Managing your Finances? N   Housekeeping or managing your Housekeeping? N     Patient Care  Team: Carlean Jews, NP as PCP - General (Family Medicine)  Indicate any recent Medical Services you may have received from other than [facility] providers in the past year (date may be approximate).     Assessment:   This is a routine wellness examination for Janet Austin.  Hearing/Vision screen Hearing Screening - Comments:: Denies hearing difficulties   Vision Screening - Comments:: Wears rx glasses - up to date with routine eye exams with  [facility]  Dietary issues and exercise activities discussed:     Goals Addressed  This Visit's Progress     Lose weight (pt-stated)         Depression Screen    06/13/2023   11:18 AM 06/04/2023    2:06 PM 01/31/2023    1:51 PM 09/18/2022    1:53 PM 04/24/2022    2:29 PM 01/25/2022   10:12 AM 01/09/2022   11:36 AM  PHQ 2/9 Scores  PHQ - 2 Score 0 1 0 0 2 2 2   PHQ- 9 Score 0 10 2 7 6 7 8     Fall Risk    06/13/2023   11:20 AM 01/31/2023    1:53 PM 01/25/2022   10:12 AM 01/09/2022   11:37 AM 11/28/2021    2:09 PM  Fall Risk   Falls in the past year? 1 0 1 0 1  Number falls in past yr: 0 0 0 0 1  Injury with Fall? 0 0 0 0 1  Risk for fall due to : No Fall Risks    No Fall Risks  Follow up Falls prevention discussed  Falls evaluation completed Falls evaluation completed     MEDICARE RISK AT HOME:  Medicare Risk at Home - 06/13/23 1125     Any stairs in or around the home? Yes    If so, are there any without handrails? No    Home free of loose throw rugs in walkways, pet beds, electrical cords, etc? Yes    Adequate lighting in your home to reduce risk of falls? Yes    Life alert? No    Use of a cane, walker or w/c? No    Grab bars in the bathroom? Yes    Shower chair or bench in shower? Yes    Elevated toilet seat or a handicapped toilet? No             TIMED UP AND GO:  Was the test performed?  No    Cognitive Function:    10/07/2022    4:21 PM 11/09/2020    8:47 AM 08/03/2019    4:48 PM  MMSE - Mini  Mental State Exam  Orientation to time 5 5 5   Orientation to Place 5 5 5   Registration 3 3 3   Attention/ Calculation 5 5 5   Recall 3 3 3   Language- name 2 objects 2 2 2   Language- repeat 1 1 1   Language- follow 3 step command 3 3 3   Language- read & follow direction 1 1 1   Write a sentence 1 1 1   Copy design 1 1 1   Total score 30 30 30         06/13/2023   11:21 AM 09/18/2022    2:03 PM 08/29/2021   10:51 AM  6CIT Screen  What Year? 0 points 0 points 0 points  What month? 0 points 0 points 0 points  What time? 0 points 0 points 0 points  Count back from 20 0 points 0 points 0 points  Months in reverse 0 points 0 points 0 points  Repeat phrase 0 points 0 points 0 points  Total Score 0 points 0 points 0 points    Immunizations Immunization History  Administered Date(s) Administered   Influenza Inj Mdck Quad Pf 11/09/2020   Influenza,inj,Quad PF,6+ Mos 10/04/2017, 11/10/2018, 09/30/2019, 09/18/2022   Influenza-Unspecified 10/28/2017, 09/17/2019   Janssen (J&J) SARS-COV-2 Vaccination 03/30/2020   Moderna Sars-Covid-2 Vaccination 12/05/2020   Tdap 08/29/2021    TDAP status: Up to date  Flu Vaccine status: Up to date  Covid-19 vaccine status: Completed vaccines  Qualifies for Shingles Vaccine? Yes   Zostavax completed No   Shingrix Completed?: No.    Education has been provided regarding the importance of this vaccine. Patient has been advised to call insurance company to determine out of pocket expense if they have not yet received this vaccine. Advised may also receive vaccine at local pharmacy or Health Dept. Verbalized acceptance and understanding.  Screening Tests Health Maintenance  Topic Date Due   HIV Screening  Never done   Hepatitis C Screening  Never done   Zoster Vaccines- Shingrix (1 of 2) Never done   COVID-19 Vaccine (3 - 2023-24 season) 06/29/2023 (Originally 08/17/2022)   INFLUENZA VACCINE  07/18/2023   Medicare Annual Wellness (AWV)  06/12/2024    Colonoscopy  05/29/2026   DTaP/Tdap/Td (2 - Td or Tdap) 08/30/2031   HPV VACCINES  Aged Out   MAMMOGRAM  Discontinued   PAP SMEAR-Modifier  Discontinued    Health Maintenance  Health Maintenance Due  Topic Date Due   HIV Screening  Never done   Hepatitis C Screening  Never done   Zoster Vaccines- Shingrix (1 of 2) Never done    Colorectal cancer screening: Type of screening: Colonoscopy. Completed 05/29/16. Repeat every 10 years      Lung Cancer Screening: (Low Dose CT Chest recommended if Age 36-80 years, 20 pack-year currently smoking OR have quit w/in 15years.) does not qualify.    Additional Screening:  Hepatitis C Screening: does qualify; Deferred  Vision Screening: Recommended annual ophthalmology exams for early detection of glaucoma and other disorders of the eye. Is the patient up to date with their annual eye exam?  Yes  Who is the provider or what is the name of the office in which the patient attends annual eye exams? [facility] If pt is not established with a provider, would they like to be referred to a provider to establish care? No .   Dental Screening: Recommended annual dental exams for proper oral hygiene    Community Resource Referral / Chronic Care Management\\  CRR required this visit?  No   CCM required this visit?  No     Plan:     I have personally reviewed and noted the following in the patient's chart:   Medical and social history Use of alcohol, tobacco or illicit drugs  Current medications and supplements including opioid prescriptions. Patient is not currently taking opioid prescriptions. Functional ability and status Nutritional status Physical activity Advanced directives List of other physicians Hospitalizations, surgeries, and ER visits in previous 12 months Vitals Screenings to include cognitive, depression, and falls Referrals and appointments  In addition, I have reviewed and discussed with patient certain  preventive protocols, quality metrics, and best practice recommendations. A written personalized care plan for preventive services as well as general preventive health recommendations were provided to patient.     Tillie Rung, LPN   01/16/[id number]   After Visit Summary: (MyChart) Due to this being a telephonic visit, the after visit summary with patients personalized plan was offered to patient via MyChart   Nurse Notes: Patient due HIV and Hep-C Screening

## 2023-06-13 NOTE — Patient Instructions (Addendum)
Janet Austin , Thank you for taking time to come for your Medicare Wellness Visit. I appreciate your ongoing commitment to your health goals. Please review the following plan we discussed and let me know if I can assist you in the future.   These are the goals we discussed:  Goals       Lose weight (pt-stated)        This is a list of the screening recommended for you and due dates:  Health Maintenance  Topic Date Due   HIV Screening  Never done   Hepatitis C Screening  Never done   Zoster (Shingles) Vaccine (1 of 2) Never done   COVID-19 Vaccine (3 - 2023-24 season) 06/29/2023*   Flu Shot  07/18/2023   Medicare Annual Wellness Visit  06/12/2024   Colon Cancer Screening  05/29/2026   DTaP/Tdap/Td vaccine (2 - Td or Tdap) 08/30/2031   HPV Vaccine  Aged Out   Mammogram  Discontinued   Pap Smear  Discontinued  *Topic was postponed. The date shown is not the original due date.    Advanced directives: Please bring a copy of your health care power of attorney and living will to the office to be added to your chart at your convenience.   Conditions/risks identified: None  Next appointment: Follow up in one year for your annual wellness visit.   Preventive Care 40-64 Years, Female Preventive care refers to lifestyle choices and visits with your health care provider that can promote health and wellness. What does preventive care include? A yearly physical exam. This is also called an annual well check. Dental exams once or twice a year. Routine eye exams. Ask your health care provider how often you should have your eyes checked. Personal lifestyle choices, including: Daily care of your teeth and gums. Regular physical activity. Eating a healthy diet. Avoiding tobacco and drug use. Limiting alcohol use. Practicing safe sex. Taking low-dose aspirin daily starting at age 23. Taking vitamin and mineral supplements as recommended by your health care provider. What happens during an  annual well check? The services and screenings done by your health care provider during your annual well check will depend on your age, overall health, lifestyle risk factors, and family history of disease. Counseling  Your health care provider may ask you questions about your: Alcohol use. Tobacco use. Drug use. Emotional well-being. Home and relationship well-being. Sexual activity. Eating habits. Work and work Astronomer. Method of birth control. Menstrual cycle. Pregnancy history. Screening  You may have the following tests or measurements: Height, weight, and BMI. Blood pressure. Lipid and cholesterol levels. These may be checked every 5 years, or more frequently if you are over 59 years old. Skin check. Lung cancer screening. You may have this screening every year starting at age 61 if you have a 30-pack-year history of smoking and currently smoke or have quit within the past 15 years. Fecal occult blood test (FOBT) of the stool. You may have this test every year starting at age 50. Flexible sigmoidoscopy or colonoscopy. You may have a sigmoidoscopy every 5 years or a colonoscopy every 10 years starting at age 52. Hepatitis C blood test. Hepatitis B blood test. Sexually transmitted disease (STD) testing. Diabetes screening. This is done by checking your blood sugar (glucose) after you have not eaten for a while (fasting). You may have this done every 1-3 years. Mammogram. This may be done every 1-2 years. Talk to your health care provider about when you should start  having regular mammograms. This may depend on whether you have a family history of breast cancer. BRCA-related cancer screening. This may be done if you have a family history of breast, ovarian, tubal, or peritoneal cancers. Pelvic exam and Pap test. This may be done every 3 years starting at age 13. Starting at age 44, this may be done every 5 years if you have a Pap test in combination with an HPV test. Bone  density scan. This is done to screen for osteoporosis. You may have this scan if you are at high risk for osteoporosis. Discuss your test results, treatment options, and if necessary, the need for more tests with your health care provider. Vaccines  Your health care provider may recommend certain vaccines, such as: Influenza vaccine. This is recommended every year. Tetanus, diphtheria, and acellular pertussis (Tdap, Td) vaccine. You may need a Td booster every 10 years. Zoster vaccine. You may need this after age 4. Pneumococcal 13-valent conjugate (PCV13) vaccine. You may need this if you have certain conditions and were not previously vaccinated. Pneumococcal polysaccharide (PPSV23) vaccine. You may need one or two doses if you smoke cigarettes or if you have certain conditions. Talk to your health care provider about which screenings and vaccines you need and how often you need them. This information is not intended to replace advice given to you by your health care provider. Make sure you discuss any questions you have with your health care provider. Document Released: 12/30/2015 Document Revised: 08/22/2016 Document Reviewed: 10/04/2015 Elsevier Interactive Patient Education  2017 [place] Prevention in the Home Falls can cause injuries. They can happen to people of all ages. There are many things you can do to make your home safe and to help prevent falls. What can I do on the outside of my home? Regularly fix the edges of walkways and driveways and fix any cracks. Remove anything that might make you trip as you walk through a door, such as a raised step or threshold. Trim any bushes or trees on the path to your home. Use bright outdoor lighting. Clear any walking paths of anything that might make someone trip, such as rocks or tools. Regularly check to see if handrails are loose or broken. Make sure that both sides of any steps have handrails. Any raised decks and  porches should have guardrails on the edges. Have any leaves, snow, or ice cleared regularly. Use sand or salt on walking paths during winter. Clean up any spills in your garage right away. This includes oil or grease spills. What can I do in the bathroom? Use night lights. Install grab bars by the toilet and in the tub and shower. Do not use towel bars as grab bars. Use non-skid mats or decals in the tub or shower. If you need to sit down in the shower, use a plastic, non-slip stool. Keep the floor dry. Clean up any water that spills on the floor as soon as it happens. Remove soap buildup in the tub or shower regularly. Attach bath mats securely with double-sided non-slip rug tape. Do not have throw rugs and other things on the floor that can make you trip. What can I do in the bedroom? Use night lights. Make sure that you have a light by your bed that is easy to reach. Do not use any sheets or blankets that are too big for your bed. They should not hang down onto the floor. Have a firm chair that  has side arms. You can use this for support while you get dressed. Do not have throw rugs and other things on the floor that can make you trip. What can I do in the kitchen? Clean up any spills right away. Avoid walking on wet floors. Keep items that you use a lot in easy-to-reach places. If you need to reach something above you, use a strong step stool that has a grab bar. Keep electrical cords out of the way. Do not use floor polish or wax that makes floors slippery. If you must use wax, use non-skid floor wax. Do not have throw rugs and other things on the floor that can make you trip. What can I do with my stairs? Do not leave any items on the stairs. Make sure that there are handrails on both sides of the stairs and use them. Fix handrails that are broken or loose. Make sure that handrails are as long as the stairways. Check any carpeting to make sure that it is firmly attached to the  stairs. Fix any carpet that is loose or worn. Avoid having throw rugs at the top or bottom of the stairs. If you do have throw rugs, attach them to the floor with carpet tape. Make sure that you have a light switch at the top of the stairs and the bottom of the stairs. If you do not have them, ask someone to add them for you. What else can I do to help prevent falls? Wear shoes that: Do not have high heels. Have rubber bottoms. Are comfortable and fit you well. Are closed at the toe. Do not wear sandals. If you use a stepladder: Make sure that it is fully opened. Do not climb a closed stepladder. Make sure that both sides of the stepladder are locked into place. Ask someone to hold it for you, if possible. Clearly mark and make sure that you can see: Any grab bars or handrails. First and last steps. Where the edge of each step is. Use tools that help you move around (mobility aids) if they are needed. These include: Canes. Walkers. Scooters. Crutches. Turn on the lights when you go into a dark area. Replace any light bulbs as soon as they burn out. Set up your furniture so you have a clear path. Avoid moving your furniture around. If any of your floors are uneven, fix them. If there are any pets around you, be aware of where they are. Review your medicines with your doctor. Some medicines can make you feel dizzy. This can increase your chance of falling. Ask your doctor what other things that you can do to help prevent falls. This information is not intended to replace advice given to you by your health care provider. Make sure you discuss any questions you have with your health care provider. Document Released: 09/29/2009 Document Revised: 05/10/2016 Document Reviewed: 01/07/2015 Elsevier Interactive Patient Education  2017 [employer].

## 2023-06-16 DIAGNOSIS — E119 Type 2 diabetes mellitus without complications: Secondary | ICD-10-CM | POA: Insufficient documentation

## 2023-06-16 DIAGNOSIS — E559 Vitamin D deficiency, unspecified: Secondary | ICD-10-CM | POA: Insufficient documentation

## 2023-06-16 DIAGNOSIS — R7301 Impaired fasting glucose: Secondary | ICD-10-CM | POA: Insufficient documentation

## 2023-06-16 NOTE — Assessment & Plan Note (Signed)
Check fasting lipid panel and treat as indicated  

## 2023-06-16 NOTE — Assessment & Plan Note (Signed)
May continue alprazolam 0.5 mg up to twice daily as needed for acute anxiety.  -new prescription sent to her pharmacy today

## 2023-06-16 NOTE — Assessment & Plan Note (Signed)
May continue zolpidem at bedtime as needed.  -new prescription sent to her pharmacy today

## 2023-06-16 NOTE — Assessment & Plan Note (Signed)
Check vitamin d level and treat deficiency as indicated.   

## 2023-06-16 NOTE — Assessment & Plan Note (Signed)
Check labs for further evaluation.  

## 2023-06-16 NOTE — Assessment & Plan Note (Signed)
Check HgbA1c along with routine, fasting labs

## 2023-06-19 DIAGNOSIS — M1711 Unilateral primary osteoarthritis, right knee: Secondary | ICD-10-CM | POA: Diagnosis not present

## 2023-06-26 DIAGNOSIS — K080 Exfoliation of teeth due to systemic causes: Secondary | ICD-10-CM | POA: Diagnosis not present

## 2023-07-22 ENCOUNTER — Telehealth: Payer: Self-pay | Admitting: Internal Medicine

## 2023-07-22 NOTE — Telephone Encounter (Signed)
Hi Dr. Leone Payor,    Patient is requesting to have her care transferred over to you specifically. Patient states she is due for her 5 year follow up colonoscopy. Last had one in 2017 with [facility]. Patient is requesting to transfer her care due to previous provider retiring and not being happy with care provided. Patient states her neighbor is a patient of yours and recommended patient to you due to being very happy with care provided. Patient states she is not having any current symptoms. Previous records are in [facility] for you to review and advise on scheduling.    Thank you.

## 2023-07-22 NOTE — Telephone Encounter (Signed)
My apologies to her but am unable to accept her as a patient due to size of current practice and existing wait times

## 2023-07-23 NOTE — Telephone Encounter (Signed)
Looks like father has family history of colon cancer. Okay to schedule for direct colonoscopy for colon cancer screening.  Colonoscopy 05/29/16: Internal hemorrhoids. Good prep.

## 2023-07-23 NOTE — Telephone Encounter (Signed)
Patient has been advised

## 2023-07-23 NOTE — Telephone Encounter (Signed)
Good afternoon Dr. Leonides Schanz  Would you be willing take this patient on to get a colonoscopy due to family history? She requested Dr. Leone Payor but he unfortunately cannot accommodate her. After speaking with her she asked to have a female provider. Records are available in Epic. Please review and advise of scheduling. Thank you.

## 2023-07-24 NOTE — Telephone Encounter (Signed)
Left VM for patient to call back and schedule direct colonoscopy with Dr. Leonides Schanz

## 2023-08-08 ENCOUNTER — Encounter: Payer: Self-pay | Admitting: Family Medicine

## 2023-08-08 ENCOUNTER — Ambulatory Visit (INDEPENDENT_AMBULATORY_CARE_PROVIDER_SITE_OTHER): Payer: Medicare Other | Admitting: Family Medicine

## 2023-08-08 VITALS — BP 165/98 | HR 79 | Resp 18 | Ht 64.0 in | Wt 241.0 lb

## 2023-08-08 DIAGNOSIS — Z1159 Encounter for screening for other viral diseases: Secondary | ICD-10-CM

## 2023-08-08 DIAGNOSIS — E119 Type 2 diabetes mellitus without complications: Secondary | ICD-10-CM | POA: Diagnosis not present

## 2023-08-08 DIAGNOSIS — Z6841 Body Mass Index (BMI) 40.0 and over, adult: Secondary | ICD-10-CM | POA: Diagnosis not present

## 2023-08-08 DIAGNOSIS — E782 Mixed hyperlipidemia: Secondary | ICD-10-CM

## 2023-08-08 DIAGNOSIS — E559 Vitamin D deficiency, unspecified: Secondary | ICD-10-CM | POA: Diagnosis not present

## 2023-08-08 LAB — POCT GLYCOSYLATED HEMOGLOBIN (HGB A1C): Hemoglobin A1C: 6.7 % — AB (ref 4.0–5.6)

## 2023-08-08 MED ORDER — TIRZEPATIDE 2.5 MG/0.5ML ~~LOC~~ SOAJ: 2.5000 mg | SUBCUTANEOUS | 1 refills | Status: DC

## 2023-08-08 NOTE — Assessment & Plan Note (Addendum)
We discussed that GLP-1 medications fall into either the diabetes treatment category or the weight management category to clarify which medications she may qualify for.  Since her A1c of 6.7 meets criteria for diagnosis of type 2 diabetes, she qualifies for [facility] or Ozempic.  She would prefer to try Mounjaro to start.  In addition to lowering A1c, it may be helpful in weight management which will decrease the risk of renal disease, improve pain, reduce cholesterol levels, reduce risk of cardiovascular events.

## 2023-08-08 NOTE — Progress Notes (Signed)
Acute Office Visit  Subjective:     Patient ID: Janet Austin, female    DOB: 1961/05/26, 62 y.o.   MRN: [id number]  Chief Complaint  Patient presents with   Weight Management Screening    HPI Patient is in today for weight management. Weight management: Patient would like to lose weight and cites health, increased physical ability as reasons for wanting to lose weight.. They have been following a low calorie. Exercise routine includes walking as much as she can, though she is somewhat limited due to chronic pain. She has called her insurance and states that they will cover Zepbound "if the prescriber words things correctly". She will send a picture of her notes from that phone call with that information.   Review of Systems  Constitutional:  Negative for chills, fever and malaise/fatigue.  Respiratory:  Negative for shortness of breath.   Cardiovascular:  Negative for chest pain and palpitations.  Gastrointestinal:  Negative for abdominal pain.  Neurological:  Negative for dizziness and headaches.      Objective:    BP (!) 165/98 (BP Location: Right Arm, Patient Position: Sitting, Cuff Size: Normal)   Pulse 79   Resp 18   Ht 5\' 4"  (1.626 m)   Wt 241 lb (109.3 kg)   SpO2 95%   BMI 41.37 kg/m   Physical Exam Constitutional:      General: She is not in acute distress.    Appearance: Normal appearance.  HENT:     Head: Normocephalic and atraumatic.  Cardiovascular:     Rate and Rhythm: Normal rate and regular rhythm.     Heart sounds: No murmur heard.    No friction rub. No gallop.  Pulmonary:     Effort: Pulmonary effort is normal. No respiratory distress.     Breath sounds: No wheezing, rhonchi or rales.  Skin:    General: Skin is warm and dry.  Neurological:     Mental Status: She is alert and oriented to person, place, and time.    Results for orders placed or performed in visit on 08/08/23  POCT glycosylated hemoglobin (Hb A1C)  Result Value Ref Range    Hemoglobin A1C 6.7 (A) 4.0 - 5.6 %   HbA1c POC (<> result, manual entry)     HbA1c, POC (prediabetic range)     HbA1c, POC (controlled diabetic range)       Assessment & Plan:  BMI 40.0-44.9, adult [facility]) Assessment & Plan: We discussed that GLP-1 medications fall into either the diabetes treatment category or the weight management category.  We will try to get Zepbound approved for weight management in addition to preventing diabetes, improving pain, reducing cholesterol levels, reducing risk of cardiovascular events.  If A1c meets criteria for diagnosis of diabetes, that would open other injectable GLP-1 medications as options.  Orders: -     CBC with Differential/Platelet; Future -     Comprehensive metabolic panel; Future -     Lipid panel; Future -     VITAMIN D 25 Hydroxy (Vit-D Deficiency, Fractures); Future -     Tirzepatide; Inject 2.5 mg into the skin once a week.  Dispense: 2 mL; Refill: 1 -     POCT glycosylated hemoglobin (Hb A1C)  Type 2 diabetes mellitus without complication, without long-term current use of insulin (HCC) Assessment & Plan: A1c 6.7 today meeting diagnostic criteria for type 2 diabetes mellitis.  Patient would prefer to try [facility] first in conjunction with a low-carb diet and  routine physical activity.  Start Mounjaro 2.5 mg weekly injection.  We discussed the mechanism of action and potential side effects.  Patient will eat smaller meals and maintain hydration to minimize possibility of GI side effects.  Patient verbalized understanding and is agreeable to this plan.  Orders: -     Tirzepatide; Inject 2.5 mg into the skin once a week.  Dispense: 2 mL; Refill: 1  Mixed hyperlipidemia Assessment & Plan: Last lipid panel more than 18 months ago.  Repeating fasting lipid panel today and will adjust management as needed.  Will continue to monitor.  Orders: -     CBC with Differential/Platelet; Future -     Comprehensive metabolic panel; Future -     Lipid  panel; Future -     Tirzepatide; Inject 2.5 mg into the skin once a week.  Dispense: 2 mL; Refill: 1  Vitamin D deficiency  Screening for viral disease -     Hepatitis C antibody; Future -     HIV Antibody (routine testing w rflx); Future    Return in about 2 months (around 10/08/2023) for follow-up for weight management.  Melida Quitter, PA

## 2023-08-08 NOTE — Assessment & Plan Note (Signed)
Last lipid panel more than 18 months ago.  Repeating fasting lipid panel today and will adjust management as needed.  Will continue to monitor.

## 2023-08-08 NOTE — Assessment & Plan Note (Signed)
A1c 6.7 today meeting diagnostic criteria for type 2 diabetes mellitis.  Patient would prefer to try [facility] first in conjunction with a low-carb diet and routine physical activity.  Start Mounjaro 2.5 mg weekly injection.  We discussed the mechanism of action and potential side effects.  Patient will eat smaller meals and maintain hydration to minimize possibility of GI side effects.  Patient verbalized understanding and is agreeable to this plan.

## 2023-08-09 ENCOUNTER — Encounter: Payer: Self-pay | Admitting: Family Medicine

## 2023-08-09 LAB — COMPREHENSIVE METABOLIC PANEL
ALT: 13 IU/L (ref 0–32)
AST: 9 IU/L (ref 0–40)
Albumin: 4.2 g/dL (ref 3.9–4.9)
Alkaline Phosphatase: 113 IU/L (ref 44–121)
BUN/Creatinine Ratio: 14 (ref 12–28)
BUN: 10 mg/dL (ref 8–27)
Bilirubin Total: 0.4 mg/dL (ref 0.0–1.2)
CO2: 23 mmol/L (ref 20–29)
Calcium: 9.2 mg/dL (ref 8.7–10.3)
Chloride: 104 mmol/L (ref 96–106)
Creatinine, Ser: 0.72 mg/dL (ref 0.57–1.00)
Globulin, Total: 2.3 g/dL (ref 1.5–4.5)
Glucose: 110 mg/dL — ABNORMAL HIGH (ref 70–99)
Potassium: 4.3 mmol/L (ref 3.5–5.2)
Sodium: 142 mmol/L (ref 134–144)
Total Protein: 6.5 g/dL (ref 6.0–8.5)
eGFR: 94 mL/min/{1.73_m2} (ref >59)

## 2023-08-09 LAB — LIPID PANEL
Chol/HDL Ratio: 3.3 ratio (ref 0.0–4.4)
Cholesterol, Total: 170 mg/dL (ref 100–199)
HDL: 51 mg/dL (ref >39)
LDL Chol Calc (NIH): 97 mg/dL (ref 0–99)
Triglycerides: 126 mg/dL (ref 0–149)
VLDL Cholesterol Cal: 22 mg/dL (ref 5–40)

## 2023-08-09 LAB — CBC WITH DIFFERENTIAL/PLATELET
Basophils Absolute: 0 10*3/uL (ref 0.0–0.2)
Basos: 1 %
EOS (ABSOLUTE): 0.1 10*3/uL (ref 0.0–0.4)
Eos: 1 %
Hematocrit: 42.5 % (ref 34.0–46.6)
Hemoglobin: 13.6 g/dL (ref 11.1–15.9)
Immature Grans (Abs): 0 10*3/uL (ref 0.0–0.1)
Immature Granulocytes: 0 %
Lymphocytes Absolute: 1.4 10*3/uL (ref 0.7–3.1)
Lymphs: 20 %
MCH: 27.6 pg (ref 26.6–33.0)
MCHC: 32 g/dL (ref 31.5–35.7)
MCV: 86 fL (ref 79–97)
Monocytes Absolute: 0.4 10*3/uL (ref 0.1–0.9)
Monocytes: 6 %
Neutrophils Absolute: 5 10*3/uL (ref 1.4–7.0)
Neutrophils: 72 %
Platelets: 319 10*3/uL (ref 150–450)
RBC: 4.92 x10E6/uL (ref 3.77–5.28)
RDW: 13.4 % (ref 11.7–15.4)
WBC: 7 10*3/uL (ref 3.4–10.8)

## 2023-08-09 LAB — HEPATITIS C ANTIBODY: Hep C Virus Ab: NONREACTIVE

## 2023-08-09 LAB — VITAMIN D 25 HYDROXY (VIT D DEFICIENCY, FRACTURES): Vit D, 25-Hydroxy: 27.4 ng/mL — ABNORMAL LOW (ref 30.0–100.0)

## 2023-08-09 LAB — HIV ANTIBODY (ROUTINE TESTING W REFLEX): HIV Screen 4th Generation wRfx: NONREACTIVE

## 2023-09-25 ENCOUNTER — Encounter: Payer: Self-pay | Admitting: Family Medicine

## 2023-09-25 ENCOUNTER — Ambulatory Visit (INDEPENDENT_AMBULATORY_CARE_PROVIDER_SITE_OTHER): Payer: Medicare Other | Admitting: Family Medicine

## 2023-09-25 VITALS — BP 114/78 | HR 97 | Temp 98.1°F | Resp 18 | Ht 64.0 in | Wt 233.0 lb

## 2023-09-25 DIAGNOSIS — E119 Type 2 diabetes mellitus without complications: Secondary | ICD-10-CM | POA: Diagnosis not present

## 2023-09-25 DIAGNOSIS — J019 Acute sinusitis, unspecified: Secondary | ICD-10-CM | POA: Diagnosis not present

## 2023-09-25 DIAGNOSIS — B9689 Other specified bacterial agents as the cause of diseases classified elsewhere: Secondary | ICD-10-CM

## 2023-09-25 MED ORDER — MOUNJARO 5 MG/0.5ML ~~LOC~~ SOAJ: 5.0000 mg | SUBCUTANEOUS | 1 refills | Status: DC

## 2023-09-25 MED ORDER — AMOXICILLIN-POT CLAVULANATE 875-125 MG PO TABS: 1.0000 | ORAL_TABLET | Freq: Two times a day (BID) | ORAL | 0 refills | Status: DC

## 2023-09-25 NOTE — Progress Notes (Signed)
Acute Office Visit  Subjective:     Patient ID: Janet Austin, female    DOB: 07/04/1961, 62 y.o.   MRN: [id number]  Chief Complaint  Patient presents with   URI    Symptoms presented 3 wks ago. Cough, sinusitis, rt ear pain (3-4 days), post nasal drip    HPI Patient is in today for cough, nasal congestion, right ear pain, postnasal drip that started 3 weeks ago.  Within the past week, she also developed sinus pressure and a low-grade fever, max temp 99.5 F.  She has a history of recurrent sinus infections and this feels very similar.  Within the past week her right ear has also started to get achy, and she endorses an achy feeling in her eyes with increased tears.  She has taken 2 COVID tests at home which were both negative on 09/23/2023 and 09/24/2023.  Over-the-counter guaifenesin has mildly improved congestion but not resolved.  She is coughing and spells which is bothersome, sputum is clear.  ROS Negative unless otherwise noted in HPI    Objective:    BP 114/78 (BP Location: Right Arm, Patient Position: Sitting, Cuff Size: Large)   Pulse 97   Temp 98.1 F (36.7 C) (Oral)   Resp 18   Ht 5\' 4"  (1.626 m)   Wt 233 lb (105.7 kg)   SpO2 96%   BMI 39.99 kg/m   Physical Exam Constitutional:      General: She is not in acute distress.    Appearance: Normal appearance. She is not ill-appearing.  HENT:     Head: Normocephalic and atraumatic.     Right Ear: Tympanic membrane, ear canal and external ear normal.     Left Ear: Tympanic membrane, ear canal and external ear normal.     Ears:     Comments: Small amount of fluid behind right tympanic membrane    Nose: Congestion and rhinorrhea present.     Right Sinus: Maxillary sinus tenderness and frontal sinus tenderness present.     Left Sinus: Maxillary sinus tenderness and frontal sinus tenderness present.     Mouth/Throat:     Mouth: Mucous membranes are moist.     Pharynx: Oropharynx is clear. Posterior oropharyngeal  erythema present. No oropharyngeal exudate.  Eyes:     General: No scleral icterus.       Right eye: No discharge.        Left eye: No discharge.     Extraocular Movements: Extraocular movements intact.     Conjunctiva/sclera: Conjunctivae normal.     Pupils: Pupils are equal, round, and reactive to light.  Cardiovascular:     Rate and Rhythm: Normal rate and regular rhythm.     Heart sounds: No murmur heard.    No friction rub. No gallop.  Pulmonary:     Effort: Pulmonary effort is normal. No respiratory distress.     Breath sounds: Normal breath sounds. No wheezing, rhonchi or rales.  Skin:    General: Skin is warm and dry.  Neurological:     Mental Status: She is alert and oriented to person, place, and time.      Assessment & Plan:  Acute bacterial rhinosinusitis -     Amoxicillin-Pot Clavulanate; Take 1 tablet by mouth 2 (two) times daily.  Dispense: 20 tablet; Refill: 0  Type 2 diabetes mellitus without complication, without long-term current use of insulin (HCC) -     Mounjaro; Inject 5 mg into the skin once a week.  Dispense: 6 mL; Refill: 1  Length of symptoms and presentation makes acute bacterial sinusitis likely, start 10-day course of Augmentin.  Continue adequate hydration and guaifenesin along with rescue albuterol inhaler for wheezing/SOB.  Continue with eyedrops.  Return if symptoms worsen or fail to improve.  Melida Quitter, PA

## 2023-09-30 DIAGNOSIS — M170 Bilateral primary osteoarthritis of knee: Secondary | ICD-10-CM | POA: Diagnosis not present

## 2023-10-08 ENCOUNTER — Encounter: Payer: Self-pay | Admitting: Family Medicine

## 2023-10-08 ENCOUNTER — Ambulatory Visit (INDEPENDENT_AMBULATORY_CARE_PROVIDER_SITE_OTHER): Payer: Medicare Other | Admitting: Family Medicine

## 2023-10-08 VITALS — BP 120/84 | HR 104 | Resp 18 | Ht 64.0 in | Wt 228.0 lb

## 2023-10-08 DIAGNOSIS — E119 Type 2 diabetes mellitus without complications: Secondary | ICD-10-CM | POA: Diagnosis not present

## 2023-10-08 DIAGNOSIS — Z6841 Body Mass Index (BMI) 40.0 and over, adult: Secondary | ICD-10-CM

## 2023-10-08 DIAGNOSIS — F411 Generalized anxiety disorder: Secondary | ICD-10-CM | POA: Diagnosis not present

## 2023-10-08 LAB — POCT UA - MICROALBUMIN
Creatinine, POC: 200 mg/dL
Microalbumin Ur, POC: 80 mg/L

## 2023-10-08 MED ORDER — BUSPIRONE HCL 5 MG PO TABS: 5.0000 mg | ORAL_TABLET | Freq: Three times a day (TID) | ORAL | 1 refills | Status: DC

## 2023-10-08 MED ORDER — ALPRAZOLAM 1 MG PO TABS: ORAL_TABLET | ORAL | 2 refills | Status: DC

## 2023-10-08 NOTE — Progress Notes (Signed)
Established Patient Office Visit  Subjective   Patient ID: Janet Austin, female    DOB: Feb 28, 1961  Age: 62 y.o. MRN: [id number]  Chief Complaint  Patient presents with   Anxiety   Depression   Weight Management Screening   Diabetes    HPI Janet Austin is a 62 y.o. female presenting today for follow up of weight management, mood. Weight management: Weight has decreased 5 lbs since last visit.  Janet Austin has been tolerated well in conjunction with low-carb diet and routine physical activity.  Denies side effects. Mood: Patient is here to follow up for anxiety, currently managing with Xanax 0.5 to 1 mg daily as prescribed by previous PCP.  She does endorse needing to take Xanax every single day to manage anxiety. Denies mood changes or SI/HI. She feels mood is  fairly stable  since last visit.     10/08/2023    1:32 PM 08/08/2023    8:35 AM 06/13/2023   11:18 AM  Depression screen PHQ 2/9  Decreased Interest 0 0 0  Down, Depressed, Hopeless 1 0 0  PHQ - 2 Score 1 0 0  Altered sleeping 2 2 0  Tired, decreased energy 2 2 0  Change in appetite 2 2 0  Feeling bad or failure about yourself  1 0 0  Trouble concentrating 2 1 0  Moving slowly or fidgety/restless 1 0 0  Suicidal thoughts 0 0 0  PHQ-9 Score 11 7 0  Difficult doing work/chores Somewhat difficult Somewhat difficult Not difficult at all       10/08/2023    1:33 PM 08/08/2023    8:35 AM 01/31/2023    1:52 PM 09/18/2022    1:54 PM  GAD 7 : Generalized Anxiety Score  Nervous, Anxious, on Edge 2 3 1 1   Control/stop worrying 0 1 0 1  Worry too much - different things 1 1 0 1  Trouble relaxing 2 2 0 1  Restless 1 0 3 1  Easily annoyed or irritable 0 0 0 1  Afraid - awful might happen 0 0 0 1  Total GAD 7 Score 6 7 4 7   Anxiety Difficulty Somewhat difficult Somewhat difficult Not difficult at all      Outpatient Medications Prior to Visit  Medication Sig   albuterol (VENTOLIN HFA) 108 (90 Base) MCG/ACT inhaler  Inhale 2 puffs into the lungs every 6 (six) hours as needed for wheezing or shortness of breath.   atorvastatin (LIPITOR) 10 MG tablet Take 1 tablet (10 mg total) by mouth daily.   clobetasol ointment (TEMOVATE) 0.05 % Apply 1 application topically daily as needed.   SYMBICORT 160-4.5 MCG/ACT inhaler INHALE 2 PUFFS INTO THE LUNGS TWICE A DAY (Patient taking differently: Inhale 2 puffs into the lungs daily.)   tirzepatide (MOUNJARO) 5 MG/0.5ML Pen Inject 5 mg into the skin once a week.   zolpidem (AMBIEN) 10 MG tablet Take 1 tablet (10 mg total) by mouth at bedtime as needed. for sleep   [DISCONTINUED] ALPRAZolam (XANAX) 1 MG tablet TAKE ONE-HALF TO ONE TABLET BY MOUTH ONE TIME DAILY AS NEEDED FOR ANXIETY   [DISCONTINUED] amoxicillin-clavulanate (AUGMENTIN) 875-125 MG tablet Take 1 tablet by mouth 2 (two) times daily.   [DISCONTINUED] triamcinolone ointment (KENALOG) 0.5 % Apply 1 application topically 2 (two) times daily.   [DISCONTINUED] valACYclovir (VALTREX) 1000 MG tablet Take 1 tablet (1,000 mg total) by mouth 2 (two) times daily. (Patient not taking: Reported on 10/08/2023)   No  facility-administered medications prior to visit.    ROS Negative unless otherwise noted in HPI   Objective:     BP 120/84 (BP Location: Right Arm, Patient Position: Sitting, Cuff Size: Large)   Pulse (!) 104   Resp 18   Ht 5\' 4"  (1.626 m)   Wt 228 lb (103.4 kg)   SpO2 96%   BMI 39.14 kg/m   Physical Exam Constitutional:      General: She is not in acute distress.    Appearance: Normal appearance.  HENT:     Head: Normocephalic and atraumatic.  Cardiovascular:     Rate and Rhythm: Normal rate and regular rhythm.     Heart sounds: No murmur heard.    No friction rub. No gallop.  Pulmonary:     Effort: Pulmonary effort is normal. No respiratory distress.     Breath sounds: No wheezing, rhonchi or rales.  Musculoskeletal:     Cervical back: Normal range of motion.  Skin:    General: Skin is  warm and dry.  Neurological:     General: No focal deficit present.     Mental Status: She is alert and oriented to person, place, and time. Mental status is at baseline.  Psychiatric:        Mood and Affect: Mood normal.        Thought Content: Thought content normal.        Judgment: Judgment normal.    Diabetic Foot Exam - Simple   Simple Foot Form Diabetic Foot exam was performed with the following findings: Yes 10/08/2023  5:55 PM  Visual Inspection No deformities, no ulcerations, no other skin breakdown bilaterally: Yes Sensation Testing Intact to touch and monofilament testing bilaterally: Yes Pulse Check Posterior Tibialis and Dorsalis pulse intact bilaterally: Yes Comments Intact to touch and monofilament testing bilaterally other than dorsal point    Results for orders placed or performed in visit on 10/08/23  POCT UA - Microalbumin  Result Value Ref Range   Microalbumin Ur, POC 80 mg/L   Creatinine, POC 200 mg/dL   Albumin/Creatinine Ratio, Urine, POC 30-300      Assessment & Plan:  Type 2 diabetes mellitus without complication, without long-term current use of insulin (HCC) Assessment & Plan: Most recent A1c 6.7.  Tolerating Mounjaro well for both blood sugar balance and weight management.  Continue Mounjaro 5 mg weekly, low-carb diet, and routine physical activity.  Will continue to monitor.  Orders: -     POCT UA - Microalbumin  BMI 40.0-44.9, adult (HCC) Assessment & Plan: Starting weight 241 lbs, current weight 228 lbs. continue Mounjaro 5 mg weekly for diabetes and weight management.  Will continue to monitor.   Generalized anxiety disorder Assessment & Plan: Discussed that benzodiazepines are not approved or recommended for daily use as chronic treatment for anxiety.  They are great as rescue medication.  Patient has previously tried Zoloft which was ineffective and Effexor which was terrible to taper off of, so she does not want to take any  antidepressants.  Discussed that SSRIs like Zoloft and SNRIs like Effexor are the first-line recommended treatment for anxiety in order to increase serotonin levels in the brain to promote neurologic connections to reform and help the brain to heal.  Patient declines at this time.  She is willing to try buspirone, start 5 mg once daily and can increase up to 3 times daily before next appointment.  After that, can increase dose if needed.  She is willing  to try decreasing use of Xanax when adding daily medication.  She is not interested in therapy at this time.  Orders: -     busPIRone HCl; Take 1 tablet (5 mg total) by mouth 3 (three) times daily.  Dispense: 90 tablet; Refill: 1 -     ALPRAZolam; TAKE ONE-HALF TO ONE TABLET BY MOUTH ONE TIME DAILY AS NEEDED FOR BREAKTHROUGH ANXIETY  Dispense: 25 tablet; Refill: 2    Return in about 2 months (around 12/20/2023) for follow-up for mood.    Melida Quitter, PA

## 2023-10-08 NOTE — Patient Instructions (Addendum)
START buspirone 1 tablet each morning for 1 week. Then, increase to 1 tablet in the morning and 1 tablet in the evening. After 1 week of consistently taking buspirone, try decreasing or skipping a dose of Xanax to see how you do. Buspirone is a recommended long term option for managing anxiety. Benzodiazepines like Xanax, with the risk of dependence, potentially fatal withdrawal, respiratory depression, and memory issues.  They are not recommended for daily use, but are are a great option for occasional use for breakthrough anxiety.  We will continue to work on gradually decreasing your need for Xanax while making sure that we are still helping the anxiety!

## 2023-10-08 NOTE — Assessment & Plan Note (Signed)
Most recent A1c 6.7.  Tolerating Mounjaro well for both blood sugar balance and weight management.  Continue Mounjaro 5 mg weekly, low-carb diet, and routine physical activity.  Will continue to monitor.

## 2023-10-08 NOTE — Assessment & Plan Note (Signed)
Discussed that benzodiazepines are not approved or recommended for daily use as chronic treatment for anxiety.  They are great as rescue medication.  Patient has previously tried Zoloft which was ineffective and Effexor which was terrible to taper off of, so she does not want to take any antidepressants.  Discussed that SSRIs like Zoloft and SNRIs like Effexor are the first-line recommended treatment for anxiety in order to increase serotonin levels in the brain to promote neurologic connections to reform and help the brain to heal.  Patient declines at this time.  She is willing to try buspirone, start 5 mg once daily and can increase up to 3 times daily before next appointment.  After that, can increase dose if needed.  She is willing to try decreasing use of Xanax when adding daily medication.  She is not interested in therapy at this time.

## 2023-10-08 NOTE — Assessment & Plan Note (Signed)
Starting weight 241 lbs, current weight 228 lbs. continue Mounjaro 5 mg weekly for diabetes and weight management.  Will continue to monitor.

## 2023-10-14 ENCOUNTER — Encounter: Payer: Self-pay | Admitting: Family Medicine

## 2023-10-14 DIAGNOSIS — F411 Generalized anxiety disorder: Secondary | ICD-10-CM

## 2023-10-14 MED ORDER — BUSPIRONE HCL 5 MG PO TABS: 5.0000 mg | ORAL_TABLET | Freq: Three times a day (TID) | ORAL | 1 refills | Status: DC

## 2023-10-14 MED ORDER — ALPRAZOLAM 1 MG PO TABS
ORAL_TABLET | ORAL | 2 refills | Status: DC
Start: 2023-10-14 — End: 2023-12-20

## 2023-10-23 ENCOUNTER — Other Ambulatory Visit: Payer: Self-pay | Admitting: Nurse Practitioner

## 2023-10-23 DIAGNOSIS — E782 Mixed hyperlipidemia: Secondary | ICD-10-CM

## 2023-10-23 DIAGNOSIS — J4520 Mild intermittent asthma, uncomplicated: Secondary | ICD-10-CM

## 2023-10-24 MED ORDER — ALBUTEROL SULFATE HFA 108 (90 BASE) MCG/ACT IN AERS: 2.0000 | INHALATION_SPRAY | Freq: Four times a day (QID) | RESPIRATORY_TRACT | 3 refills | Status: AC | PRN

## 2023-10-24 MED ORDER — ATORVASTATIN CALCIUM 10 MG PO TABS: 10.0000 mg | ORAL_TABLET | Freq: Every day | ORAL | 0 refills | Status: DC

## 2023-11-18 ENCOUNTER — Other Ambulatory Visit: Payer: Self-pay | Admitting: Family Medicine

## 2023-11-18 DIAGNOSIS — E119 Type 2 diabetes mellitus without complications: Secondary | ICD-10-CM

## 2023-11-19 DIAGNOSIS — H40003 Preglaucoma, unspecified, bilateral: Secondary | ICD-10-CM | POA: Diagnosis not present

## 2023-11-19 LAB — HM DIABETES EYE EXAM

## 2023-12-02 ENCOUNTER — Other Ambulatory Visit: Payer: Self-pay | Admitting: Nurse Practitioner

## 2023-12-02 DIAGNOSIS — F5101 Primary insomnia: Secondary | ICD-10-CM

## 2023-12-03 ENCOUNTER — Encounter: Payer: Self-pay | Admitting: Family Medicine

## 2023-12-03 NOTE — Telephone Encounter (Signed)
 Care team updated and letter sent for eye exam notes.

## 2023-12-03 NOTE — Telephone Encounter (Signed)
PDMP reviewed, no aberrancies.  Overdose risk score 200.  At next appointment, will need to discuss decreasing dose to 5 mg as recommended for females

## 2023-12-17 ENCOUNTER — Ambulatory Visit
Admission: RE | Admit: 2023-12-17 | Discharge: 2023-12-17 | Disposition: A | Discharge status: Home / Self Care | Payer: Medicare Other | Source: Ambulatory Visit | Attending: Emergency Medicine | Admitting: Emergency Medicine

## 2023-12-17 VITALS — BP 135/86 | HR 83 | Temp 98.9°F | Resp 20

## 2023-12-17 DIAGNOSIS — J069 Acute upper respiratory infection, unspecified: Secondary | ICD-10-CM

## 2023-12-17 MED ORDER — PREDNISONE 10 MG (21) PO TBPK: ORAL_TABLET | Freq: Every day | ORAL | 0 refills | Status: DC

## 2023-12-17 MED ORDER — PROMETHAZINE-DM 6.25-15 MG/5ML PO SYRP
5.0000 mL | ORAL_SOLUTION | Freq: Every evening | ORAL | 0 refills | Status: DC | PRN
Start: 2023-12-17 — End: 2024-02-26

## 2023-12-17 MED ORDER — BENZONATATE 100 MG PO CAPS: 100.0000 mg | ORAL_CAPSULE | Freq: Three times a day (TID) | ORAL | 0 refills | Status: DC

## 2023-12-17 MED ORDER — AMOXICILLIN-POT CLAVULANATE 875-125 MG PO TABS: 1.0000 | ORAL_TABLET | Freq: Two times a day (BID) | ORAL | 0 refills | Status: AC

## 2023-12-17 NOTE — Discharge Instructions (Addendum)
 Begin augmentin  every morning and every evening for 10 days fore coverage of bacteria  Begin prednisone  every morning with food to open and relax the airway should  May use Tessalon  pill every 8 hours as needed to help calm coughing, may use cough syrup at bedtime as needed for additional comfort    You can take Tylenol and/or Ibuprofen as needed for fever reduction and pain relief.   For cough: honey 1/2 to 1 teaspoon (you can dilute the honey in water or another fluid).  You can also use guaifenesin and dextromethorphan for cough. You can use a humidifier for chest congestion and cough.  If you don't have a humidifier, you can sit in the bathroom with the hot shower running.      For sore throat: try warm salt water gargles, cepacol lozenges, throat spray, warm tea or water with lemon/honey, popsicles or ice, or OTC cold relief medicine for throat discomfort.   For congestion: take a daily anti-histamine like Zyrtec, Claritin, and a oral decongestant, such as pseudoephedrine.  You can also use Flonase 1-2 sprays in each nostril daily.   It is important to stay hydrated: drink plenty of fluids (water, gatorade/powerade/pedialyte, juices, or teas) to keep your throat moisturized and help further relieve irritation/discomfort.

## 2023-12-17 NOTE — ED Triage Notes (Signed)
 Patient to Urgent Care with complaints of cough, nasal congestion, low grade fevers, generalized body aches. Reports symptoms started 3.5 weeks ago.  Shortness of breath when laying down- drainage and tightness. Having some coughing fits.   Hx of asthma- increased usage of albuterol  inhaler/ ibuprofen/ saline spray.

## 2023-12-17 NOTE — ED Provider Notes (Signed)
 [name]    CSN: [id number] Arrival date & time: 12/17/23  1217      History   Chief Complaint Chief Complaint  Patient presents with   Cough    Cough-achy-low fever - Entered by patient    HPI Janet Austin is a 62 y.o. female.   Presents for evaluation of nasal congestion, rhinorrhea, nonproductive cough, shortness of breath with exertion, wheezing, generalized bodyaches present for 3-1/2 weeks.  Symptoms worsen over nighttime and have progressed over the last 3 days.  Initial fever peaking at 100.5.  Associated right sided ear pain and sinus pressure to the bilateral cheeks.  Has attempted use of albuterol  inhaler, endorses using more than baseline, history of asthma.  Additionally has taken Advil and saline mist spray and use humidifier.  Tolerating food and liquids.  No known sick contacts prior.  Past Medical History:  Diagnosis Date   Anxiety    Arthritis    Asthma    [employer] 02/13/2021   Cancer (HCC)    left breast   Headache    migraines   Hyperlipidemia    [employer] 02/13/2021   PONV (postoperative nausea and vomiting)    Sleep apnea     Patient Active Problem List   Diagnosis Date Noted   Vitamin D  deficiency 06/16/2023   Type 2 diabetes mellitus (HCC) 06/16/2023   Wheezing 03/07/2023   BMI 40.0-44.9, adult (HCC) 01/31/2022   Elevated blood pressure reading without diagnosis of hypertension 03/27/2021   Eczema 02/14/2021   History of breast cancer in female 11/09/2020   Daytime hypersomnolence 08/24/2020   Other fatigue 02/12/2020   Seasonal allergic rhinitis due to pollen 08/03/2019   Mild intermittent asthma without complication 08/22/2018   Mixed hyperlipidemia 07/27/2018   Generalized anxiety disorder 03/11/2018   Primary insomnia 03/11/2018    Past Surgical History:  Procedure Laterality Date   BILATERAL TOTAL MASTECTOMY WITH AXILLARY LYMPH NODE DISSECTION     double mastectomy   BREAST SURGERY     reconstruction    COLONOSCOPY WITH PROPOFOL  N/A 05/29/2016   Procedure: COLONOSCOPY WITH PROPOFOL ;  Surgeon: Gladis RAYMOND Mariner, MD;  Location: [facility] ENDOSCOPY;  Service: Endoscopy;  Laterality: N/A;   KNEE ARTHROSCOPY Right    MASTECTOMY     TOTAL ABDOMINAL HYSTERECTOMY      OB History   No obstetric history on file.      Home Medications    Prior to Admission medications   Medication Sig Start Date End Date Taking? Authorizing Provider  amoxicillin -clavulanate (AUGMENTIN ) 875-125 MG tablet Take 1 tablet by mouth every 12 (twelve) hours for 10 days. 12/17/23 12/27/23 Yes Shantanique Hodo R, NP  benzonatate  (TESSALON ) 100 MG capsule Take 1 capsule (100 mg total) by mouth every 8 (eight) hours. 12/17/23  Yes Savien Mamula R, NP  predniSONE  (STERAPRED UNI-PAK 21 TAB) 10 MG (21) TBPK tablet Take by mouth daily. Take 6 tabs by mouth daily  for 1 days, then 5 tabs for 1 days, then 4 tabs for 1 days, then 3 tabs for 1 days, 2 tabs for 1 days, then 1 tab by mouth daily for 1 days 12/17/23  Yes Jenesis Suchy R, NP  promethazine -dextromethorphan (PROMETHAZINE -DM) 6.25-15 MG/5ML syrup Take 5 mLs by mouth at bedtime as needed for cough. 12/17/23  Yes Lei Dower, Shelba SAUNDERS, NP  albuterol  (VENTOLIN  HFA) 108 (90 Base) MCG/ACT inhaler Inhale 2 puffs into the lungs every 6 (six) hours as needed for wheezing or shortness of breath. 10/24/23  Wallace Search A, PA  ALPRAZolam  (XANAX ) 1 MG tablet TAKE ONE-HALF TO ONE TABLET BY MOUTH ONE TIME DAILY AS NEEDED FOR BREAKTHROUGH ANXIETY 10/14/23   Wallace Search A, PA  atorvastatin  (LIPITOR) 10 MG tablet Take 1 tablet (10 mg total) by mouth daily. 10/24/23   Wallace Search LABOR, PA  busPIRone  (BUSPAR ) 5 MG tablet Take 1 tablet (5 mg total) by mouth 3 (three) times daily. 10/14/23   Wallace Search LABOR, PA  clobetasol  ointment (TEMOVATE ) 0.05 % Apply 1 application topically daily as needed. 11/03/18   Scarboro, Adam J, NP  MOUNJARO  5 MG/0.5ML Pen INJECT THE CONTENTS OF ONE PEN UNDER THE  SKIN WEEKLY ON THE SAME DAY EACH WEEK 11/18/23   Wallace Search A, PA  SYMBICORT  160-4.5 MCG/ACT inhaler INHALE 2 PUFFS INTO THE LUNGS TWICE A DAY Patient taking differently: Inhale 2 puffs into the lungs daily. 08/06/22   Kasa, Kurian, MD  zolpidem  (AMBIEN ) 10 MG tablet TAKE ONE TABLET BY MOUTH ONE TIME DAILY AT BEDTIME AS NEEDED FOR SLEEP 12/03/23   Wallace Search LABOR, PA    Family History Family History  Problem Relation Age of Onset   Hypertension Father     Social History Social History   Tobacco Use   Smoking status: Never    Passive exposure: Never   Smokeless tobacco: Never  Vaping Use   Vaping status: Never Used  Substance Use Topics   Alcohol use: No   Drug use: No     Allergies   Other, Asa [aspirin], Gabapentin, Lyrica [pregabalin], and Tape   Review of Systems Review of Systems   Physical Exam Triage Vital Signs ED Triage Vitals  Encounter Vitals Group     BP 12/17/23 1301 135/86     Systolic BP Percentile --      Diastolic BP Percentile --      Pulse Rate 12/17/23 1301 83     Resp 12/17/23 1301 20     Temp 12/17/23 1301 98.9 F (37.2 C)     Temp src --      SpO2 12/17/23 1301 96 %     Weight --      Height --      Head Circumference --      Peak Flow --      Pain Score 12/17/23 1252 2     Pain Loc --      Pain Education --      Exclude from Growth Chart --    No data found.  Updated Vital Signs BP 135/86   Pulse 83   Temp 98.9 F (37.2 C)   Resp 20   SpO2 96%   Visual Acuity Right Eye Distance:   Left Eye Distance:   Bilateral Distance:    Right Eye Near:   Left Eye Near:    Bilateral Near:     Physical Exam Constitutional:      Appearance: She is ill-appearing.  HENT:     Right Ear: Ear canal and external ear normal. A middle ear effusion is present.     Left Ear: Ear canal and external ear normal. A middle ear effusion is present.     Nose: Congestion present. No rhinorrhea.     Right Sinus: Maxillary sinus tenderness  present.     Left Sinus: Maxillary sinus tenderness present.     Mouth/Throat:     Mouth: Mucous membranes are moist.     Pharynx: Oropharynx is clear.  Eyes:  Extraocular Movements: Extraocular movements intact.  Cardiovascular:     Rate and Rhythm: Normal rate and regular rhythm.     Pulses: Normal pulses.     Heart sounds: Normal heart sounds.  Pulmonary:     Effort: Pulmonary effort is normal.     Comments: Rhonchi present to the left upper lobe, remaining lobes clear Skin:    General: Skin is warm and dry.  Neurological:     Mental Status: She is alert and oriented to person, place, and time. Mental status is at baseline.      UC Treatments / Results  Labs (all labs ordered are listed, but only abnormal results are displayed) Labs Reviewed - No data to display  EKG   Radiology No results found.  Procedures Procedures (including critical care time)  Medications Ordered in UC Medications - No data to display  Initial Impression / Assessment and Plan / UC Course  I have reviewed the triage vital signs and the nursing notes.  Pertinent labs & imaging results that were available during my care of the patient were reviewed by me and considered in my medical decision making (see chart for details).  Acute URI  Patient is in no signs of distress nor toxic appearing.  Vital signs are stable.   Symptoms of a sinus infection, symptoms present for 3-1/2 weeks, rhonchi heard to auscultation, while ill-appearing in no signs of distress nontoxic-appearing, stable for outpatient management.  Prescribed Augmentin , prednisone , Tessalon  and Promethazine  DM. May use additional over-the-counter medications as needed for supportive care.  May follow-up with urgent care as needed if symptoms persist or worsen.    Final Clinical Impressions(s) / UC Diagnoses   Final diagnoses:  Acute URI     Discharge Instructions      Begin augmentin  every morning and every evening for 10  days fore coverage of bacteria  Begin prednisone  every morning with food to open and relax the airway should  May use Tessalon  pill every 8 hours as needed to help calm coughing, may use cough syrup at bedtime as needed for additional comfort    You can take Tylenol and/or Ibuprofen as needed for fever reduction and pain relief.   For cough: honey 1/2 to 1 teaspoon (you can dilute the honey in water or another fluid).  You can also use guaifenesin and dextromethorphan for cough. You can use a humidifier for chest congestion and cough.  If you don't have a humidifier, you can sit in the bathroom with the hot shower running.      For sore throat: try warm salt water gargles, cepacol lozenges, throat spray, warm tea or water with lemon/honey, popsicles or ice, or OTC cold relief medicine for throat discomfort.   For congestion: take a daily anti-histamine like Zyrtec, Claritin, and a oral decongestant, such as pseudoephedrine.  You can also use Flonase 1-2 sprays in each nostril daily.   It is important to stay hydrated: drink plenty of fluids (water, gatorade/powerade/pedialyte, juices, or teas) to keep your throat moisturized and help further relieve irritation/discomfort.    ED Prescriptions     Medication Sig Dispense Auth. Provider   amoxicillin -clavulanate (AUGMENTIN ) 875-125 MG tablet Take 1 tablet by mouth every 12 (twelve) hours for 10 days. 20 tablet Glendi Mohiuddin R, NP   predniSONE  (STERAPRED UNI-PAK 21 TAB) 10 MG (21) TBPK tablet Take by mouth daily. Take 6 tabs by mouth daily  for 1 days, then 5 tabs for 1 days, then 4 tabs for  1 days, then 3 tabs for 1 days, 2 tabs for 1 days, then 1 tab by mouth daily for 1 days 21 tablet Quinnetta Roepke R, NP   benzonatate  (TESSALON ) 100 MG capsule Take 1 capsule (100 mg total) by mouth every 8 (eight) hours. 21 capsule Shedrick Sarli R, NP   promethazine -dextromethorphan (PROMETHAZINE -DM) 6.25-15 MG/5ML syrup Take 5 mLs by mouth at bedtime as  needed for cough. 118 mL Clarrisa Kaylor, Shelba SAUNDERS, NP      PDMP not reviewed this encounter.   Teresa Shelba SAUNDERS, NP 12/17/23 1436

## 2023-12-20 ENCOUNTER — Encounter: Payer: Self-pay | Admitting: Family Medicine

## 2023-12-20 ENCOUNTER — Ambulatory Visit (INDEPENDENT_AMBULATORY_CARE_PROVIDER_SITE_OTHER): Payer: Medicare Other | Admitting: Family Medicine

## 2023-12-20 VITALS — BP 133/85 | HR 75 | Temp 97.9°F | Ht 64.0 in | Wt 217.0 lb

## 2023-12-20 DIAGNOSIS — F411 Generalized anxiety disorder: Secondary | ICD-10-CM | POA: Diagnosis not present

## 2023-12-20 DIAGNOSIS — E785 Hyperlipidemia, unspecified: Secondary | ICD-10-CM

## 2023-12-20 DIAGNOSIS — E119 Type 2 diabetes mellitus without complications: Secondary | ICD-10-CM

## 2023-12-20 DIAGNOSIS — E1169 Type 2 diabetes mellitus with other specified complication: Secondary | ICD-10-CM | POA: Diagnosis not present

## 2023-12-20 MED ORDER — ALPRAZOLAM 0.5 MG PO TABS: 0.5000 mg | ORAL_TABLET | ORAL | 1 refills | Status: DC | PRN

## 2023-12-20 MED ORDER — ATORVASTATIN CALCIUM 10 MG PO TABS
10.0000 mg | ORAL_TABLET | Freq: Every day | ORAL | 0 refills | Status: DC
Start: 2023-12-20 — End: 2024-04-02

## 2023-12-20 MED ORDER — MOUNJARO 5 MG/0.5ML ~~LOC~~ SOAJ: 5.0000 mg | SUBCUTANEOUS | 1 refills | Status: DC

## 2023-12-20 NOTE — Assessment & Plan Note (Signed)
 Continue working to decrease use of Xanax  and eventually taper off.  Continue buspirone  5 mg once daily and 2.5 mg once daily.  Discussed that she can adjust the dose anywhere from 2.5 mg once daily up to 5 mg 3 times daily.  Patient verbalized understanding and is agreeable to this plan.

## 2023-12-20 NOTE — Patient Instructions (Signed)
 You can continue to adjust the dose of the buspirone as needed anywhere from 2.5 mg once daily up to the full 5 mg 3 times daily.  It is definitely an option to take 5 mg once daily and 2.5 mg once daily.

## 2023-12-20 NOTE — Progress Notes (Signed)
 Established Patient Office Visit  Subjective   Patient ID: MIIA BLANKS, female    DOB: [birth year]  Age: 63 y.o. MRN: [id number]  Chief Complaint  Patient presents with   Follow-up    HPI Janet Austin is a 63 y.o. female presenting today for follow up of mood.  She was also seen at urgent care on 12/17/2023 for acute URI symptoms and fever.  Prescribed Augmentin , prednisone , Tessalon  Perles, Promethazine  DM and recommended to follow-up with urgent care for worsening or persisting symptoms.  Chest x-ray was not conducted at that time, symptoms consistent with sinus infection with rhonchi in left upper lobe.  She is now on day 3 of Augmentin . Mood: Patient is here to follow up for depression and anxiety, at last appointment initiated buspirone  5 mg daily.  She feels that this medication has made her feel almost [place], though she does not know that that is exactly the right way to describe it.  Overall she is happy with how she has felt on the buspirone .  She is now only needing to take half a Xanax  tablet once a day.     12/20/2023   11:16 AM 10/08/2023    1:32 PM 08/08/2023    8:35 AM  Depression screen PHQ 2/9  Decreased Interest 0 0 0  Down, Depressed, Hopeless 1 1 0  PHQ - 2 Score 1 1 0  Altered sleeping 2 2 2   Tired, decreased energy 2 2 2   Change in appetite 1 2 2   Feeling bad or failure about yourself  0 1 0  Trouble concentrating 0 2 1  Moving slowly or fidgety/restless 0 1 0  Suicidal thoughts 0 0 0  PHQ-9 Score 6 11 7   Difficult doing work/chores Somewhat difficult Somewhat difficult Somewhat difficult       12/20/2023   11:16 AM 10/08/2023    1:33 PM 08/08/2023    8:35 AM 01/31/2023    1:52 PM  GAD 7 : Generalized Anxiety Score  Nervous, Anxious, on Edge 2 2 3 1   Control/stop worrying 0 0 1 0  Worry too much - different things 0 1 1 0  Trouble relaxing 1 2 2  0  Restless 1 1 0 3  Easily annoyed or irritable 0 0 0 0  Afraid - awful might happen 0 0 0 0  Total GAD  7 Score 4 6 7 4   Anxiety Difficulty Somewhat difficult Somewhat difficult Somewhat difficult Not difficult at all     Outpatient Medications Prior to Visit  Medication Sig Note   albuterol  (VENTOLIN  HFA) 108 (90 Base) MCG/ACT inhaler Inhale 2 puffs into the lungs every 6 (six) hours as needed for wheezing or shortness of breath.    amoxicillin -clavulanate (AUGMENTIN ) 875-125 MG tablet Take 1 tablet by mouth every 12 (twelve) hours for 10 days.    benzonatate  (TESSALON ) 100 MG capsule Take 1 capsule (100 mg total) by mouth every 8 (eight) hours.    busPIRone  (BUSPAR ) 5 MG tablet Take 1 tablet (5 mg total) by mouth 3 (three) times daily.    clobetasol  ointment (TEMOVATE ) 0.05 % Apply 1 application topically daily as needed.    predniSONE  (STERAPRED UNI-PAK 21 TAB) 10 MG (21) TBPK tablet Take by mouth daily. Take 6 tabs by mouth daily  for 1 days, then 5 tabs for 1 days, then 4 tabs for 1 days, then 3 tabs for 1 days, 2 tabs for 1 days, then 1 tab by mouth daily for 1  days    promethazine -dextromethorphan (PROMETHAZINE -DM) 6.25-15 MG/5ML syrup Take 5 mLs by mouth at bedtime as needed for cough.    SYMBICORT  160-4.5 MCG/ACT inhaler INHALE 2 PUFFS INTO THE LUNGS TWICE A DAY (Patient taking differently: Inhale 2 puffs into the lungs daily.)    zolpidem  (AMBIEN ) 10 MG tablet TAKE ONE TABLET BY MOUTH ONE TIME DAILY AT BEDTIME AS NEEDED FOR SLEEP    [DISCONTINUED] ALPRAZolam  (XANAX ) 1 MG tablet TAKE ONE-HALF TO ONE TABLET BY MOUTH ONE TIME DAILY AS NEEDED FOR BREAKTHROUGH ANXIETY 12/20/2023: tapering off slowly   [DISCONTINUED] atorvastatin  (LIPITOR) 10 MG tablet Take 1 tablet (10 mg total) by mouth daily.    [DISCONTINUED] MOUNJARO  5 MG/0.5ML Pen INJECT THE CONTENTS OF ONE PEN UNDER THE SKIN WEEKLY ON THE SAME DAY EACH WEEK    No facility-administered medications prior to visit.    ROS Negative unless otherwise noted in HPI   Objective:     BP 133/85   Pulse 75   Temp 97.9 F (36.6 C) (Temporal)    Ht 5' 4 (1.626 m)   Wt 217 lb (98.4 kg)   SpO2 96%   BMI 37.25 kg/m   Physical Exam Constitutional:      General: She is not in acute distress.    Appearance: Normal appearance.  HENT:     Head: Normocephalic and atraumatic.  Pulmonary:     Effort: Pulmonary effort is normal. No respiratory distress.  Musculoskeletal:     Cervical back: Normal range of motion.  Neurological:     General: No focal deficit present.     Mental Status: She is alert and oriented to person, place, and time. Mental status is at baseline.  Psychiatric:        Mood and Affect: Mood normal.        Thought Content: Thought content normal.        Judgment: Judgment normal.      Assessment & Plan:  Generalized anxiety disorder Assessment & Plan: Continue working to decrease use of Xanax  and eventually taper off.  Continue buspirone  5 mg once daily and 2.5 mg once daily.  Discussed that she can adjust the dose anywhere from 2.5 mg once daily up to 5 mg 3 times daily.  Patient verbalized understanding and is agreeable to this plan.  Orders: -     ALPRAZolam ; Take 1 tablet (0.5 mg total) by mouth as needed for anxiety (up to once daily, for BREAKTHROUGH ANXIETY).  Dispense: 20 tablet; Refill: 1 -     CBC with Differential/Platelet; Future -     Comprehensive metabolic panel; Future  Type 2 diabetes mellitus without complication, without long-term current use of insulin (HCC) -     Mounjaro ; Inject 5 mg into the skin once a week.  Dispense: 6 mL; Refill: 1 -     CBC with Differential/Platelet; Future -     Comprehensive metabolic panel; Future -     Hemoglobin A1c; Future  Hyperlipidemia associated with type 2 diabetes mellitus (HCC) -     Atorvastatin  Calcium ; Take 1 tablet (10 mg total) by mouth daily.  Dispense: 90 tablet; Refill: 0 -     CBC with Differential/Platelet; Future -     Comprehensive metabolic panel; Future -     Lipid panel; Future  Provided refills for Mounjaro  and atorvastatin   today.  Return in about 3 months (around 03/19/2024) for follow-up for HLD, DM, mood, fasting blood work 1 week before.    Joesph LABOR  [place], [place]

## 2023-12-24 ENCOUNTER — Ambulatory Visit
Admission: RE | Admit: 2023-12-24 | Discharge: 2023-12-24 | Disposition: A | Discharge status: Home / Self Care | Payer: Medicare Other | Source: Ambulatory Visit | Attending: Emergency Medicine | Admitting: Emergency Medicine

## 2023-12-24 ENCOUNTER — Ambulatory Visit (INDEPENDENT_AMBULATORY_CARE_PROVIDER_SITE_OTHER): Payer: Medicare Other

## 2023-12-24 VITALS — BP 126/85 | HR 79 | Temp 97.7°F | Resp 18

## 2023-12-24 DIAGNOSIS — R509 Fever, unspecified: Secondary | ICD-10-CM | POA: Diagnosis not present

## 2023-12-24 DIAGNOSIS — R918 Other nonspecific abnormal finding of lung field: Secondary | ICD-10-CM | POA: Diagnosis not present

## 2023-12-24 DIAGNOSIS — J45998 Other asthma: Secondary | ICD-10-CM | POA: Diagnosis not present

## 2023-12-24 DIAGNOSIS — R059 Cough, unspecified: Secondary | ICD-10-CM | POA: Diagnosis not present

## 2023-12-24 DIAGNOSIS — J4521 Mild intermittent asthma with (acute) exacerbation: Secondary | ICD-10-CM

## 2023-12-24 DIAGNOSIS — R0602 Shortness of breath: Secondary | ICD-10-CM

## 2023-12-24 MED ORDER — ALBUTEROL SULFATE (2.5 MG/3ML) 0.083% IN NEBU: 2.5000 mg | INHALATION_SOLUTION | Freq: Four times a day (QID) | RESPIRATORY_TRACT | 0 refills | Status: DC | PRN

## 2023-12-24 MED ORDER — PREDNISONE 10 MG PO TABS: 40.0000 mg | ORAL_TABLET | Freq: Every day | ORAL | 0 refills | Status: AC

## 2023-12-24 MED ORDER — ALBUTEROL SULFATE (2.5 MG/3ML) 0.083% IN NEBU
2.5000 mg | INHALATION_SOLUTION | Freq: Once | RESPIRATORY_TRACT | Status: AC
  Administered 2023-12-24: 2.5 mg via RESPIRATORY_TRACT

## 2023-12-24 MED ORDER — AZITHROMYCIN 250 MG PO TABS: 250.0000 mg | ORAL_TABLET | Freq: Every day | ORAL | 0 refills | Status: DC

## 2023-12-24 NOTE — ED Triage Notes (Addendum)
 Patient to Urgent Care with complaints of cough/ nasal congestion/ wheezing/ body aches/ bilateral ear fullness.possible low grade fevers 99-99.3.  Reports symptoms started over a month ago. Seen at this facility 12/31- completed prednisone .with little improvement. Still has a few days left of augmentin . Feels as though congestion has worsened.

## 2023-12-24 NOTE — ED Provider Notes (Signed)
 Janet Austin    CSN: [id number] Arrival date & time: 12/24/23  0802      History   Chief Complaint Chief Complaint  Patient presents with   Follow-up    Cough/congestion/wheezing/achy/ears feel full/ - Entered by patient    HPI Janet Austin is a 63 y.o. female.  Patient presents with ongoing ear fullness, congestion, cough, wheezing, shortness of breath x 1 month.  She reports body aches also.  No fever or chest pain.  She last used her albuterol  inhaler during the night.  Patient was seen at this urgent care on 12/17/2023; diagnosed with acute URI; treated with Augmentin , prednisone , Tessalon  Perles, Promethazine  DM; she completed the prednisone  but is still on the Augmentin  at this time.  Her medical history includes asthma, allergies, breast cancer, diabetes, hyperlipidemia.  The history is provided by the patient and medical records.    Past Medical History:  Diagnosis Date   Anxiety    Arthritis    Asthma    [employer] 02/13/2021   Cancer (HCC)    left breast   Headache    migraines   Hyperlipidemia    [employer] 02/13/2021   PONV (postoperative nausea and vomiting)    Sleep apnea     Patient Active Problem List   Diagnosis Date Noted   Vitamin D  deficiency 06/16/2023   Type 2 diabetes mellitus (HCC) 06/16/2023   BMI 40.0-44.9, adult (HCC) 01/31/2022   Elevated blood pressure reading without diagnosis of hypertension 03/27/2021   Eczema 02/14/2021   History of breast cancer in female 11/09/2020   Daytime hypersomnolence 08/24/2020   Other fatigue 02/12/2020   Seasonal allergic rhinitis due to pollen 08/03/2019   Mild intermittent asthma without complication 08/22/2018   Hyperlipidemia associated with type 2 diabetes mellitus (HCC) 07/27/2018   Generalized anxiety disorder 03/11/2018   Primary insomnia 03/11/2018    Past Surgical History:  Procedure Laterality Date   BILATERAL TOTAL MASTECTOMY WITH AXILLARY LYMPH NODE DISSECTION     double  mastectomy   BREAST SURGERY     reconstruction   COLONOSCOPY WITH PROPOFOL  N/A 05/29/2016   Procedure: COLONOSCOPY WITH PROPOFOL ;  Surgeon: Gladis RAYMOND Mariner, MD;  Location: [facility] ENDOSCOPY;  Service: Endoscopy;  Laterality: N/A;   KNEE ARTHROSCOPY Right    MASTECTOMY     TOTAL ABDOMINAL HYSTERECTOMY      OB History   No obstetric history on file.      Home Medications    Prior to Admission medications   Medication Sig Start Date End Date Taking? Authorizing Provider  albuterol  (PROVENTIL ) (2.5 MG/3ML) 0.083% nebulizer solution Take 3 mLs (2.5 mg total) by nebulization every 6 (six) hours as needed for wheezing or shortness of breath. 12/24/23  Yes Corlis Burnard DEL, NP  azithromycin  (ZITHROMAX ) 250 MG tablet Take 1 tablet (250 mg total) by mouth daily. Take first 2 tablets together, then 1 every day until finished. 12/24/23  Yes Corlis Burnard DEL, NP  predniSONE  (DELTASONE ) 10 MG tablet Take 4 tablets (40 mg total) by mouth daily for 5 days. 12/24/23 12/29/23 Yes Corlis Burnard DEL, NP  albuterol  (VENTOLIN  HFA) 108 (90 Base) MCG/ACT inhaler Inhale 2 puffs into the lungs every 6 (six) hours as needed for wheezing or shortness of breath. 10/24/23   Wallace Joesph LABOR, PA  ALPRAZolam  (XANAX ) 0.5 MG tablet Take 1 tablet (0.5 mg total) by mouth as needed for anxiety (up to once daily, for BREAKTHROUGH ANXIETY). 12/20/23   Wallace Joesph LABOR, PA  amoxicillin -clavulanate (  AUGMENTIN ) 875-125 MG tablet Take 1 tablet by mouth every 12 (twelve) hours for 10 days. 12/17/23 12/27/23  Teresa Shelba SAUNDERS, NP  atorvastatin  (LIPITOR) 10 MG tablet Take 1 tablet (10 mg total) by mouth daily. 12/20/23   Wallace Joesph LABOR, PA  benzonatate  (TESSALON ) 100 MG capsule Take 1 capsule (100 mg total) by mouth every 8 (eight) hours. Patient not taking: Reported on 12/24/2023 12/17/23   Teresa Shelba SAUNDERS, NP  busPIRone  (BUSPAR ) 5 MG tablet Take 1 tablet (5 mg total) by mouth 3 (three) times daily. 10/14/23   Wallace Joesph LABOR, PA  clobetasol   ointment (TEMOVATE ) 0.05 % Apply 1 application topically daily as needed. 11/03/18   Scarboro, Adam J, NP  promethazine -dextromethorphan (PROMETHAZINE -DM) 6.25-15 MG/5ML syrup Take 5 mLs by mouth at bedtime as needed for cough. Patient not taking: Reported on 12/24/2023 12/17/23   Teresa Shelba SAUNDERS, NP  SYMBICORT  160-4.5 MCG/ACT inhaler INHALE 2 PUFFS INTO THE LUNGS TWICE A DAY Patient taking differently: Inhale 2 puffs into the lungs daily. 08/06/22   Kasa, Kurian, MD  tirzepatide  (MOUNJARO ) 5 MG/0.5ML Pen Inject 5 mg into the skin once a week. 01/10/24   Wallace Joesph LABOR, PA  zolpidem  (AMBIEN ) 10 MG tablet TAKE ONE TABLET BY MOUTH ONE TIME DAILY AT BEDTIME AS NEEDED FOR SLEEP 12/03/23   Wallace Joesph LABOR, PA    Family History Family History  Problem Relation Age of Onset   Hypertension Father     Social History Social History   Tobacco Use   Smoking status: Never    Passive exposure: Never   Smokeless tobacco: Never  Vaping Use   Vaping status: Never Used  Substance Use Topics   Alcohol use: No   Drug use: No     Allergies   Other, Asa [aspirin], Gabapentin, Lyrica [pregabalin], and Tape   Review of Systems Review of Systems  Constitutional:  Negative for chills and fever.  HENT:  Positive for congestion, ear pain, postnasal drip and rhinorrhea. Negative for sore throat.   Respiratory:  Positive for cough, shortness of breath and wheezing.   Cardiovascular:  Negative for chest pain and palpitations.     Physical Exam Triage Vital Signs ED Triage Vitals  Encounter Vitals Group     BP      Systolic BP Percentile      Diastolic BP Percentile      Pulse      Resp      Temp      Temp src      SpO2      Weight      Height      Head Circumference      Peak Flow      Pain Score      Pain Loc      Pain Education      Exclude from Growth Chart    No data found.  Updated Vital Signs BP 126/85   Pulse 79   Temp 97.7 F (36.5 C)   Resp 18   SpO2 96%    Visual Acuity Right Eye Distance:   Left Eye Distance:   Bilateral Distance:    Right Eye Near:   Left Eye Near:    Bilateral Near:     Physical Exam Constitutional:      General: She is not in acute distress. HENT:     Right Ear: Tympanic membrane normal.     Left Ear: Tympanic membrane normal.     Nose: Congestion  present.     Mouth/Throat:     Mouth: Mucous membranes are moist.     Pharynx: Oropharynx is clear.  Cardiovascular:     Rate and Rhythm: Normal rate and regular rhythm.     Heart sounds: Normal heart sounds.  Pulmonary:     Effort: Pulmonary effort is normal. No respiratory distress.     Breath sounds: Normal breath sounds. Decreased air movement present.  Neurological:     Mental Status: She is alert.      UC Treatments / Results  Labs (all labs ordered are listed, but only abnormal results are displayed) Labs Reviewed - No data to display  EKG   Radiology DG Chest 2 View Result Date: 12/24/2023 CLINICAL DATA:  Cough and shortness of breath.  Low-grade fever. EXAM: CHEST - 2 VIEW COMPARISON:  11/11/2022 FINDINGS: Heart size is normal. Mediastinal shadows are normal. Mild central bronchial thickening but no infiltrate, collapse or effusion. Findings suggest bronchitis. No abnormal bone finding. IMPRESSION: Mild central bronchial thickening suggesting bronchitis. No consolidation, collapse or effusion. Electronically Signed   By: Oneil Officer M.D.   On: 12/24/2023 08:53    Procedures Procedures (including critical care time)  Medications Ordered in UC Medications  albuterol  (PROVENTIL ) (2.5 MG/3ML) 0.083% nebulizer solution 2.5 mg (2.5 mg Nebulization Given 12/24/23 0826)    Initial Impression / Assessment and Plan / UC Course  I have reviewed the triage vital signs and the nursing notes.  Pertinent labs & imaging results that were available during my care of the patient were reviewed by me and considered in my medical decision making (see chart for  details).    Asthma exacerbation, cough, shortness of breath.  O2 sat 96% on room air.  Albuterol  nebulizer treatment given here and improved air movement; patient reports improved shortness of breath.  Instructed patient to complete the course of Augmentin .  Treating today with 3 max and additional 5-day course of prednisone .  The chest x-ray shows bronchial thickening but no pneumonia.  Instructed patient to follow-up with her PCP tomorrow.  ED precautions given.  Education provided on shortness of breath and asthma.  Patient agrees to plan of care.  Final Clinical Impressions(s) / UC Diagnoses   Final diagnoses:  Cough, unspecified type  Shortness of breath  Mild intermittent asthma with acute exacerbation     Discharge Instructions      Use the albuterol  nebulizer or inhaler as directed.    Continue the Augmentin .  Take the prednisone  and Zithromax  as directed.    Monitor your blood sugar closely while taking the prednisone .    Follow up with your primary care provider tomorrow.  Go to the emergency department if you have worsening symptoms.        ED Prescriptions     Medication Sig Dispense Auth. Provider   predniSONE  (DELTASONE ) 10 MG tablet Take 4 tablets (40 mg total) by mouth daily for 5 days. 20 tablet Corlis Burnard DEL, NP   azithromycin  (ZITHROMAX ) 250 MG tablet Take 1 tablet (250 mg total) by mouth daily. Take first 2 tablets together, then 1 every day until finished. 6 tablet Corlis Burnard DEL, NP   albuterol  (PROVENTIL ) (2.5 MG/3ML) 0.083% nebulizer solution Take 3 mLs (2.5 mg total) by nebulization every 6 (six) hours as needed for wheezing or shortness of breath. 75 mL Corlis Burnard DEL, NP      PDMP not reviewed this encounter.   Corlis Burnard DEL, NP 12/24/23 0900

## 2023-12-24 NOTE — Discharge Instructions (Addendum)
 Use the albuterol  nebulizer or inhaler as directed.    Continue the Augmentin .  Take the prednisone  and Zithromax  as directed.    Monitor your blood sugar closely while taking the prednisone .    Follow up with your primary care provider tomorrow.  Go to the emergency department if you have worsening symptoms.

## 2023-12-31 ENCOUNTER — Ambulatory Visit (INDEPENDENT_AMBULATORY_CARE_PROVIDER_SITE_OTHER): Payer: Medicare Other | Admitting: Family Medicine

## 2023-12-31 ENCOUNTER — Encounter: Payer: Self-pay | Admitting: Family Medicine

## 2023-12-31 VITALS — BP 119/79 | HR 91 | Temp 99.3°F | Ht 64.0 in | Wt 214.5 lb

## 2023-12-31 DIAGNOSIS — J069 Acute upper respiratory infection, unspecified: Secondary | ICD-10-CM | POA: Diagnosis not present

## 2023-12-31 MED ORDER — AZITHROMYCIN 250 MG PO TABS: 250.0000 mg | ORAL_TABLET | Freq: Every day | ORAL | 0 refills | Status: DC

## 2023-12-31 MED ORDER — ALBUTEROL SULFATE (2.5 MG/3ML) 0.083% IN NEBU: 2.5000 mg | INHALATION_SOLUTION | Freq: Four times a day (QID) | RESPIRATORY_TRACT | 0 refills | Status: DC | PRN

## 2023-12-31 NOTE — Patient Instructions (Signed)
 Treatment for mycoplasma pneumonia is typically recommended to be 7 to 14 days in length.  I have sent an additional 5-day course of azithromycin  to the pharmacy so that you meet that threshold and ensure that any lingering infection is gone.  Otherwise, continue symptomatic management as you have been.  Albuterol  nebulizer solution has also been sent to the pharmacy!

## 2023-12-31 NOTE — Progress Notes (Signed)
 Established Patient Office Visit  Subjective   Patient ID: Janet Austin, female    DOB: 1961-05-03  Age: 63 y.o. MRN: [id number]  Chief Complaint  Patient presents with   Follow-up    Urgent Care    HPI Janet Austin is a 63 y.o. female presenting today for follow up of urgent care visit on 12/24/2023. Patient was initially seen at urgent care on 12/17/2023; diagnosed with acute URI; treated with Augmentin , prednisone , Tessalon  Perles, Promethazine  DM.  Seen again on 12/24/2023, at that time was given albuterol  nebulizer treatment and added azithromycin .  The nebulizer treatments have been a huge help, and she has completed her 5-day course of azithromycin .  She notes that the urgent care provider mentioned that she was going to provide antibiotic coverage for potential mycoplasma pneumonia.  Outpatient Medications Prior to Visit  Medication Sig   albuterol  (VENTOLIN  HFA) 108 (90 Base) MCG/ACT inhaler Inhale 2 puffs into the lungs every 6 (six) hours as needed for wheezing or shortness of breath.   ALPRAZolam  (XANAX ) 0.5 MG tablet Take 1 tablet (0.5 mg total) by mouth as needed for anxiety (up to once daily, for BREAKTHROUGH ANXIETY).   atorvastatin  (LIPITOR) 10 MG tablet Take 1 tablet (10 mg total) by mouth daily.   benzonatate  (TESSALON ) 100 MG capsule Take 1 capsule (100 mg total) by mouth every 8 (eight) hours.   busPIRone  (BUSPAR ) 5 MG tablet Take 1 tablet (5 mg total) by mouth 3 (three) times daily.   clobetasol  ointment (TEMOVATE ) 0.05 % Apply 1 application topically daily as needed.   promethazine -dextromethorphan (PROMETHAZINE -DM) 6.25-15 MG/5ML syrup Take 5 mLs by mouth at bedtime as needed for cough.   SYMBICORT  160-4.5 MCG/ACT inhaler INHALE 2 PUFFS INTO THE LUNGS TWICE A DAY (Patient taking differently: Inhale 2 puffs into the lungs daily.)   [START ON 01/10/2024] tirzepatide  (MOUNJARO ) 5 MG/0.5ML Pen Inject 5 mg into the skin once a week.   zolpidem  (AMBIEN ) 10 MG tablet TAKE  ONE TABLET BY MOUTH ONE TIME DAILY AT BEDTIME AS NEEDED FOR SLEEP   [DISCONTINUED] albuterol  (PROVENTIL ) (2.5 MG/3ML) 0.083% nebulizer solution Take 3 mLs (2.5 mg total) by nebulization every 6 (six) hours as needed for wheezing or shortness of breath.   [DISCONTINUED] azithromycin  (ZITHROMAX ) 250 MG tablet Take 1 tablet (250 mg total) by mouth daily. Take first 2 tablets together, then 1 every day until finished.   No facility-administered medications prior to visit.    ROS Negative unless otherwise noted in HPI   Objective:     BP 119/79   Pulse 91   Temp 99.3 F (37.4 C) (Oral)   Ht 5' 4 (1.626 m)   Wt 214 lb 8 oz (97.3 kg)   SpO2 95%   BMI 36.82 kg/m   Physical Exam Constitutional:      General: She is not in acute distress.    Appearance: Normal appearance.  HENT:     Head: Normocephalic and atraumatic.  Cardiovascular:     Rate and Rhythm: Normal rate and regular rhythm.     Heart sounds: No murmur heard.    No friction rub. No gallop.  Pulmonary:     Effort: Pulmonary effort is normal. No respiratory distress.     Breath sounds: No wheezing, rhonchi or rales.  Skin:    General: Skin is warm and dry.  Neurological:     Mental Status: She is alert and oriented to person, place, and time.  Assessment & Plan:  Upper respiratory tract infection, unspecified type  Other orders -     Albuterol  Sulfate; Take 3 mLs (2.5 mg total) by nebulization every 6 (six) hours as needed for wheezing or shortness of breath.  Dispense: 75 mL; Refill: 0 -     Azithromycin ; Take 1 tablet (250 mg total) by mouth daily. Take first 2 tablets together, then 1 every day until finished.  Dispense: 6 tablet; Refill: 0  Agree with coverage for mycoplasma pneumonia, extend by an additional 5 days as the recommended course of azithromycin  to cover mycoplasma pneumonia is 7-10 days.  Otherwise, continue symptomatic management with albuterol  inhaler and nebulizer treatments as  prescribed.  Return if symptoms worsen or fail to improve.    Joesph DELENA Sear, PA

## 2024-01-01 ENCOUNTER — Other Ambulatory Visit: Payer: Self-pay | Admitting: Family Medicine

## 2024-01-01 DIAGNOSIS — E1169 Type 2 diabetes mellitus with other specified complication: Secondary | ICD-10-CM

## 2024-01-05 ENCOUNTER — Other Ambulatory Visit: Payer: Self-pay | Admitting: Family Medicine

## 2024-01-05 DIAGNOSIS — F411 Generalized anxiety disorder: Secondary | ICD-10-CM

## 2024-01-14 DIAGNOSIS — K080 Exfoliation of teeth due to systemic causes: Secondary | ICD-10-CM | POA: Diagnosis not present

## 2024-01-23 ENCOUNTER — Encounter: Payer: Self-pay | Admitting: Family Medicine

## 2024-01-24 DIAGNOSIS — J45998 Other asthma: Secondary | ICD-10-CM | POA: Diagnosis not present

## 2024-01-27 ENCOUNTER — Encounter: Payer: Self-pay | Admitting: Family Medicine

## 2024-01-27 ENCOUNTER — Ambulatory Visit (INDEPENDENT_AMBULATORY_CARE_PROVIDER_SITE_OTHER): Payer: Medicare Other | Admitting: Family Medicine

## 2024-01-27 VITALS — BP 102/72 | HR 86 | Temp 97.8°F | Ht 64.0 in | Wt 210.0 lb

## 2024-01-27 DIAGNOSIS — B9689 Other specified bacterial agents as the cause of diseases classified elsewhere: Secondary | ICD-10-CM | POA: Insufficient documentation

## 2024-01-27 DIAGNOSIS — J4540 Moderate persistent asthma, uncomplicated: Secondary | ICD-10-CM

## 2024-01-27 DIAGNOSIS — E119 Type 2 diabetes mellitus without complications: Secondary | ICD-10-CM | POA: Diagnosis not present

## 2024-01-27 DIAGNOSIS — J019 Acute sinusitis, unspecified: Secondary | ICD-10-CM | POA: Diagnosis not present

## 2024-01-27 MED ORDER — BUDESONIDE-FORMOTEROL FUMARATE 160-4.5 MCG/ACT IN AERO
2.0000 | INHALATION_SPRAY | Freq: Every day | RESPIRATORY_TRACT | 1 refills | Status: DC
Start: 2024-01-27 — End: 2024-07-21

## 2024-01-27 MED ORDER — MOUNJARO 5 MG/0.5ML ~~LOC~~ SOAJ: 5.0000 mg | SUBCUTANEOUS | 3 refills | Status: DC

## 2024-01-27 MED ORDER — DOXYCYCLINE HYCLATE 100 MG PO TABS: 200.0000 mg | ORAL_TABLET | Freq: Every day | ORAL | 0 refills | Status: AC

## 2024-01-27 NOTE — Assessment & Plan Note (Signed)
 Previously treated with Augmentin , changing treatment to doxycycline  200 mg for 7 days.  Also intensifying asthma treatment as an asthma exacerbation is likely contributing to recurrent illness.  Patient verbalized understanding and is agreeable to this plan.

## 2024-01-27 NOTE — Patient Instructions (Signed)
 START Zyrtec once daily. START Symbicort  once daily. CONTINUE albuterol  rescue inhaler and albuterol  nebulizer treatments as needed.  Take the full 7-day course of doxycycline  for your sinus infection. You may also take Tylenol 500 mg every 4-6 hours to reduce inflammation further and help with pain.

## 2024-01-27 NOTE — Assessment & Plan Note (Signed)
 Patient was previously using Symbicort  but has not done so in quite some time.  Suspect that it at least part of her recurrent symptoms is due to uncontrolled asthma.  Restart Symbicort  2 puffs daily for maintenance treatment.  Continue albuterol  rescue inhaler as needed.  Start Zyrtec 10 mg at bedtime as well.  Follow-up with new primary care provider once established.

## 2024-01-27 NOTE — Progress Notes (Signed)
 Acute Office Visit  Subjective:     Patient ID: Janet Austin, female    DOB: 07/18/1961, 63 y.o.   MRN: [id number]  Chief Complaint  Patient presents with   Acute Visit    Cough, congestion, low fever, sinus drainage x 2 months    HPI Patient is in today for 2 months of URI symptoms.  She was seen at urgent care on 12/17/2023 for nasal congestion, rhinorrhea, nonproductive cough, shortness of breath with exertion, wheezing, generalized bodyaches.  She was prescribed Augmentin , prednisone , Tessalon  Perles, and Promethazine  DM.  She returned to urgent care on 12/24/2023 complaining of ongoing ear fullness, congestion, cough, wheezing, shortness of breath, and bodyaches.  Chest x-ray at the time with mild central bronchial thickening suggesting bronchitis.  Underwent albuterol  nebulizer treatment in office, instructed to complete course of Augmentin  and add azithromycin .  She was then seen by primary care on 12/31/2023 and had not yet started azithromycin  at that time.  PCP started azithromycin  for mycoplasma pneumonia coverage.  Initially, her symptoms resolved, but 2 weeks ago she again began experiencing congestion, dry cough, and sinus drainage.  She also complains of right ear pain/fullness.  Her cough tends to occur during the day and at night.  She has needed to use her albuterol  rescue inhaler and albuterol  nebulizer treatments at home at least 4 times a day.  ROS See HPI    Objective:    BP 102/72   Pulse 86   Temp 97.8 F (36.6 C) (Temporal)   Ht 5\' 4"  (1.626 m)   Wt 210 lb (95.3 kg)   SpO2 96%   BMI 36.05 kg/m   Physical Exam Constitutional:      General: She is not in acute distress.    Appearance: Normal appearance. She is not ill-appearing.  HENT:     Head: Normocephalic and atraumatic.     Right Ear: Tympanic membrane, ear canal and external ear normal.     Left Ear: Tympanic membrane, ear canal and external ear normal. There is no impacted cerumen.     Nose:  Rhinorrhea present. No congestion.     Right Sinus: Maxillary sinus tenderness present. No frontal sinus tenderness.     Left Sinus: Maxillary sinus tenderness present. No frontal sinus tenderness.     Mouth/Throat:     Mouth: Mucous membranes are moist.     Pharynx: Oropharynx is clear. No oropharyngeal exudate or posterior oropharyngeal erythema.  Eyes:     General:        Right eye: No discharge.        Left eye: No discharge.     Extraocular Movements: Extraocular movements intact.     Conjunctiva/sclera: Conjunctivae normal.  Cardiovascular:     Rate and Rhythm: Normal rate and regular rhythm.     Heart sounds: No murmur heard.    No friction rub. No gallop.  Pulmonary:     Effort: Pulmonary effort is normal. No respiratory distress.     Breath sounds: Normal breath sounds. No wheezing, rhonchi or rales.  Skin:    General: Skin is warm and dry.  Neurological:     Mental Status: She is alert and oriented to person, place, and time.       Assessment & Plan:  Acute bacterial rhinosinusitis Assessment & Plan: Previously treated with Augmentin , changing treatment to doxycycline  200 mg for 7 days.  Also intensifying asthma treatment as an asthma exacerbation is likely contributing to recurrent illness.  Patient  verbalized understanding and is agreeable to this plan.  Orders: -     Doxycycline  Hyclate; Take 2 tablets (200 mg total) by mouth daily for 7 days.  Dispense: 14 tablet; Refill: 0  Moderate persistent asthma without complication Assessment & Plan: Patient was previously using Symbicort  but has not done so in quite some time.  Suspect that it at least part of her recurrent symptoms is due to uncontrolled asthma.  Restart Symbicort  2 puffs daily for maintenance treatment.  Continue albuterol  rescue inhaler as needed.  Start Zyrtec 10 mg at bedtime as well.  Follow-up with new primary care provider once established.  Orders: -     Budesonide -Formoterol  Fumarate; Inhale 2  puffs into the lungs daily.  Dispense: 10.2 g; Refill: 1  Type 2 diabetes mellitus without complication, without long-term current use of insulin (HCC) -     Mounjaro ; Inject 5 mg into the skin once a week.  Dispense: 2 mL; Refill: 3    Return if symptoms worsen or fail to improve.  Noreene Bearded, PA

## 2024-01-28 ENCOUNTER — Ambulatory Visit: Payer: Medicare Other | Admitting: Family Medicine

## 2024-01-29 ENCOUNTER — Other Ambulatory Visit: Payer: Self-pay | Admitting: Family Medicine

## 2024-01-29 DIAGNOSIS — F411 Generalized anxiety disorder: Secondary | ICD-10-CM

## 2024-02-11 ENCOUNTER — Ambulatory Visit: Payer: Self-pay | Admitting: Internal Medicine

## 2024-02-11 NOTE — Telephone Encounter (Signed)
 This patient apparently has an appointment with me tomorrow.  I have never seen her previously.  She was last seen by Dr. Belia Heman in 2022.  I am not sure what is requested here.

## 2024-02-11 NOTE — Telephone Encounter (Signed)
 Chief Complaint: cough Symptoms: ongoing productive cough Frequency: x 2 months Pertinent Negatives: Patient denies fever, SOB, CP Disposition: [] ED /[] Urgent Care (no appt availability in office) / [x] Appointment(In office/virtual)/ []  [facility]/ [] Home Care/ [] Refused Recommended Disposition /[] [facility]/ []  Follow-up with PCP Additional Notes: Patient c/o ongoing productive cough x 2 months. Reports has seen PCP and UC and has been through "several rounds of abx" but cough is still persistent. Denies any fevers, CP, SOB. Occasionally has some wheezing when laying down, but is taking all respiratory treatments as prescribed. Pulmonology PAA Louis Matte notified of disposition, patient transferred for scheduling. Patient verbalized understanding and to call back/911 with worsening symptoms.     Reason for Disposition  Cough has been present for > 3 weeks  Answer Assessment - Initial Assessment Questions E2C2 Pulmonary Triage "Chief Complaint (e.g., cough, sob, wheezing, fever, chills, sweat or additional symptoms) *Go to specific symptom protocol after initial questions. Cough - productive "How long have symptoms been present?" December Have you tested for COVID or Flu? Note: If not, ask patient if a home test can be taken. If so, instruct patient to call back for positive results. Yes, reports tested negative (last taken 5 days ago)  MEDICINES:   "Have you used any OTC meds to help with symptoms?" Yes If yes, ask "What medications?" Zyrtec, Mucinex DM - with some relief "Have you used your inhalers/maintenance medication?" Yes If yes, "What medications?" Albuterol neb 2-3x daily,  Albuterol inh PRN 2-3x daily Symbicort every morning, 1 puff (reports recently changed from 2 puffs to 1)   OXYGEN: "Do you wear supplemental oxygen?" No If yes, "How many liters are you supposed to use?" N/a "Do you monitor your oxygen levels?" Yes If yes, "What is your  reading (oxygen level) today?" 96-98% "What is your usual oxygen saturation reading?"  (Note: Pulmonary O2 sats should be 90% or greater) 96-98%   1. ONSET: "When did the cough begin?"      Since December - been to PCP, UC and went through several rounds of abx Reports UC tx for mycoplasma PNA - reports got better for 1.5 wks, then resumed coughing 2. SEVERITY: "How bad is the cough today?"      prolonged 3. SPUTUM: "Describe the color of your sputum" (none, dry cough; clear, white, yellow, green)     Clear, foamy 4. HEMOPTYSIS: "Are you coughing up any blood?" If so ask: "How much?" (flecks, streaks, tablespoons, etc.)     no 5. DIFFICULTY BREATHING: "Are you having difficulty breathing?" If Yes, ask: "How bad is it?" (e.g., mild, moderate, severe)    - MILD: No SOB at rest, mild SOB with walking, speaks normally in sentences, can lie down, no retractions, pulse < 100.    - MODERATE: SOB at rest, SOB with minimal exertion and prefers to sit, cannot lie down flat, speaks in phrases, mild retractions, audible wheezing, pulse 100-120.    - SEVERE: Very SOB at rest, speaks in single words, struggling to breathe, sitting hunched forward, retractions, pulse > 120      No, just when coughing spells 6. FEVER: "Do you have a fever?" If Yes, ask: "What is your temperature, how was it measured, and when did it start?"     no 7. CARDIAC HISTORY: "Do you have any history of heart disease?" (e.g., heart attack, congestive heart failure)      no 8. LUNG HISTORY: "Do you have any history of lung disease?"  (e.g., pulmonary  embolus, asthma, emphysema)     asthma 9. PE RISK FACTORS: "Do you have a history of blood clots?" (or: recent major surgery, recent prolonged travel, bedridden)     no 10. OTHER SYMPTOMS: "Do you have any other symptoms?" (e.g., runny nose, wheezing, chest pain)       No, just sinus congestion after a coughing spells  Protocols used: Cough - Acute Productive-A-AH

## 2024-02-12 ENCOUNTER — Encounter: Payer: Self-pay | Admitting: Pulmonary Disease

## 2024-02-12 ENCOUNTER — Ambulatory Visit: Payer: Medicare Other | Admitting: Pulmonary Disease

## 2024-02-12 VITALS — BP 120/82 | HR 85 | Temp 97.1°F | Ht 64.0 in | Wt 207.6 lb

## 2024-02-12 DIAGNOSIS — G4733 Obstructive sleep apnea (adult) (pediatric): Secondary | ICD-10-CM | POA: Diagnosis not present

## 2024-02-12 DIAGNOSIS — R058 Other specified cough: Secondary | ICD-10-CM | POA: Diagnosis not present

## 2024-02-12 DIAGNOSIS — R053 Chronic cough: Secondary | ICD-10-CM

## 2024-02-12 DIAGNOSIS — J4521 Mild intermittent asthma with (acute) exacerbation: Secondary | ICD-10-CM | POA: Diagnosis not present

## 2024-02-12 LAB — NITRIC OXIDE: Nitric Oxide: 10

## 2024-02-12 MED ORDER — AZITHROMYCIN 250 MG PO TABS: ORAL_TABLET | ORAL | 0 refills | Status: AC

## 2024-02-12 MED ORDER — METHYLPREDNISOLONE 4 MG PO TBPK: ORAL_TABLET | ORAL | 0 refills | Status: DC

## 2024-02-12 MED ORDER — IPRATROPIUM BROMIDE 0.02 % IN SOLN: 0.5000 mg | Freq: Four times a day (QID) | RESPIRATORY_TRACT | 6 refills | Status: DC | PRN

## 2024-02-12 MED ORDER — IPRATROPIUM-ALBUTEROL 0.5-2.5 (3) MG/3ML IN SOLN
3.0000 mL | Freq: Once | RESPIRATORY_TRACT | Status: AC
  Administered 2024-02-12: 3 mL via RESPIRATORY_TRACT

## 2024-02-12 NOTE — Patient Instructions (Signed)
 VISIT SUMMARY:  Janet Austin, a 63 year old female, visited today due to a persistent cough that has been ongoing since December. She has a history of mycoplasma pneumonia and bronchitis, and her symptoms have been temporarily relieved by medications like Symbicort and albuterol. She also has obstructive sleep apnea and has had difficulty using her CPAP machine due to her cough. We discussed her current treatment plan and made some adjustments to better manage her symptoms.  YOUR PLAN:  -CHRONIC COUGH WITH POST-INFECTIOUS BRONCHITIS: Post-infectious bronchitis is a lingering inflammation of the airways following an infection. To manage your symptoms, we are increasing your Symbicort to two puffs twice daily and adding ipratropium to your nebulizer treatments. You will also take azithromycin and a short taper of prednisone. Avoid mint or menthol lozenges, stay hydrated, and use non-menthol throat soothing lozenges or hard candy. Delsym is recommended for cough control. Please follow up with Dr. Belia Heman in 2-3 weeks.  -OBSTRUCTIVE SLEEP APNEA: Obstructive sleep apnea is a condition where your breathing stops and starts during sleep. Since your CPAP machine is broken, it is important to obtain a new one from [facility] to ensure proper treatment.  -GENERAL HEALTH MAINTENANCE: You received a flu shot after recovering from your initial illness. Please ensure you stay up-to-date with all vaccinations.  INSTRUCTIONS:  Please follow up with Dr. Belia Heman in 2-3 weeks to monitor your progress. Make sure to obtain a new CPAP machine from [facility] as soon as possible.

## 2024-02-12 NOTE — Progress Notes (Signed)
 Subjective:    Patient ID: Janet Austin, female    DOB: [birth year], 63 y.o.   MRN: [id number]  Patient Care Team: Bethanie Dicker, NP as PCP - General (Nurse Practitioner) Pa, [facility] [place]) Erin Fulling, MD as Consulting Physician (Pulmonary Disease)  Chief Complaint  Patient presents with   Acute Visit    She was sick on 12/3. Has had 3 antibiotics and 2 rounds of Prednisone. Cough with white foamy sputum. No SOB. Occasional wheezing.     BACKGROUND: Patient is a 63 year old lifelong never smoker who follows with Dr. Santiago Glad for asthma without complication.  She also has obstructive sleep apnea.  She presents today for an ACUTE visit due to cough after respiratory infection December 2024.  HPI Discussed the use of AI scribe software for clinical note transcription with the patient, who gave verbal consent to proceed.  History of Present Illness   Janet Austin is a 63 year old female who presents with persistent cough since December. Her primary pulmonologist is Dr. Santiago Glad.  She has been experiencing a persistent cough since December 3rd, severe enough to cause her to remove her CPAP mask at night, which she accidentally broke about ten days ago. She uses Symbicort, one puff twice a day, as instructed previously by Dr. Belia Heman, providing temporary relief. She also uses a nebulizer with albuterol daily, initially administered at urgent care, providing significant relief. Despite these treatments, the cough persists, and she feels worn out from it.  In December, she was diagnosed with mycoplasma pneumonia and treated with two courses of Z-Pak, which initially resolved her symptoms. She was well for nine days before the cough returned. She has a history of coughing for extended periods after illnesses. A chest x-ray in January showed bronchitis. She has also used prednisone in the past, which, along with the Z-Pak, helped her recover.  She continues to use CPAP therapy  for obstructive sleep apnea but reports difficulty due to coughing fits, which have led to the CPAP device being broken.  She is getting a replacement device.    TEST/EVENTS :  07/04/2017 PFTs: no airflow Tatian with FEV1 at 81%, ratio 86, FVC 77% no significant bronchodilator response, DLCO 52%, mid flow obstruction and reversibility noted. 12/24/2023 CXR PA and lateral: Mild central bronchial thickening suggesting bronchitis, no consolidation collapse or effusion.  Review of Systems A 10 point review of systems was performed and it is as noted above otherwise negative.   Past Medical History:  Diagnosis Date   Anxiety    Arthritis    Asthma    [employer]reesia 02/13/2021   Cancer (HCC)    left breast   Diabetes mellitus without complication (HCC)    Headache    migraines   Hyperlipidemia    [employer]rees[employer] 02/13/2021   PONV (postoperative nausea and vomiting)    Sleep apnea     Past Surgical History:  Procedure Laterality Date   BILATERAL TOTAL MASTECTOMY WITH AXILLARY LYMPH NODE DISSECTION     double mastectomy   BREAST SURGERY     reconstruction   COLONOSCOPY WITH PROPOFOL N/A 05/29/2016   Procedure: COLONOSCOPY WITH PROPOFOL;  Surgeon: Christena Deem, MD;  Location: [facility] ENDOSCOPY;  Service: Endoscopy;  Laterality: N/A;   KNEE ARTHROSCOPY Right    MASTECTOMY     TOTAL ABDOMINAL HYSTERECTOMY      Patient Active Problem List   Diagnosis Date Noted   Vitamin D deficiency 06/16/2023   Type 2 diabetes  mellitus (HCC) 06/16/2023   BMI 40.0-44.9, adult (HCC) 01/31/2022   Elevated blood pressure reading without diagnosis of hypertension 03/27/2021   Eczema 02/14/2021   History of breast cancer in female 11/09/2020   Daytime hypersomnolence 08/24/2020   Other fatigue 02/12/2020   Seasonal allergic rhinitis due to pollen 08/03/2019   Mild intermittent asthma without complication 08/22/2018   Hyperlipidemia associated with type 2 diabetes mellitus (HCC) 07/27/2018   Generalized  anxiety disorder 03/11/2018   Primary insomnia 03/11/2018    Family History  Problem Relation Age of Onset   Hypertension Father     Social History   Tobacco Use   Smoking status: Never    Passive exposure: Never   Smokeless tobacco: Never  Substance Use Topics   Alcohol use: No    Allergies  Allergen Reactions   Other Itching and Rash   Asa [Aspirin]    Gabapentin Other (See Comments)    Eye twitch   Lyrica [Pregabalin] Other (See Comments)    Altered mental status   Tape Rash    Patient states she had blisters and it was very itchy, ok to use paper tape and tegaderm     Current Meds  Medication Sig   albuterol (PROVENTIL) (2.5 MG/3ML) 0.083% nebulizer solution Take 3 mLs (2.5 mg total) by nebulization every 6 (six) hours as needed for wheezing or shortness of breath.   albuterol (VENTOLIN HFA) 108 (90 Base) MCG/ACT inhaler Inhale 2 puffs into the lungs every 6 (six) hours as needed for wheezing or shortness of breath.   ALPRAZolam (XANAX) 0.5 MG tablet Take 1 tablet (0.5 mg total) by mouth as needed for anxiety (up to once daily, for BREAKTHROUGH ANXIETY).   atorvastatin (LIPITOR) 10 MG tablet Take 1 tablet (10 mg total) by mouth daily.   azithromycin (ZITHROMAX) 250 MG tablet Take 2 tablets (500 mg) on  Day 1,  followed by 1 tablet (250 mg) once daily on Days 2 through 5.   budesonide-formoterol (SYMBICORT) 160-4.5 MCG/ACT inhaler Inhale 2 puffs into the lungs daily.   busPIRone (BUSPAR) 5 MG tablet TAKE 1 TABLET(5 MG) BY MOUTH THREE TIMES DAILY   ipratropium (ATROVENT) 0.02 % nebulizer solution Take 2.5 mLs (0.5 mg total) by nebulization 4 (four) times daily as needed for wheezing or shortness of breath (cough).   methylPREDNISolone (MEDROL DOSEPAK) 4 MG TBPK tablet Take as directed in the package, as a taper pack   promethazine-dextromethorphan (PROMETHAZINE-DM) 6.25-15 MG/5ML syrup Take 5 mLs by mouth at bedtime as needed for cough.   [START ON 03/06/2024] tirzepatide  (MOUNJARO) 5 MG/0.5ML Pen Inject 5 mg into the skin once a week.   zolpidem (AMBIEN) 10 MG tablet TAKE ONE TABLET BY MOUTH ONE TIME DAILY AT BEDTIME AS NEEDED FOR SLEEP    Immunization History  Administered Date(s) Administered   Influenza Inj Mdck Quad Pf 11/09/2020   Influenza,inj,Quad PF,6+ Mos 10/04/2017, 11/10/2018, 09/30/2019, 09/18/2022   Influenza-Unspecified 10/28/2017, 09/17/2019, 01/09/2024   Janssen (J&J) SARS-COV-2 Vaccination 03/30/2020   Moderna Sars-Covid-2 Vaccination 12/05/2020   Tdap 08/29/2021        Objective:     BP 120/82 (BP Location: Right Arm, Cuff Size: Large)   Pulse 85   Temp (!) 97.1 F (36.2 C)   Ht 5\' 4"  (1.626 m)   Wt 207 lb 9.6 oz (94.2 kg)   SpO2 98%   BMI 35.63 kg/m   SpO2: 98 % O2 Device: None (Room air)  GENERAL: Well-developed, obese woman, no acute distress.  Intermittently coughing.  Fully ambulatory. HEAD: Normocephalic, atraumatic.  EYES: Pupils equal, round, reactive to light.  No scleral icterus.  MOUTH: Dentition intact, oral mucosa moist.  No thrush. NECK: Supple. No thyromegaly. Trachea midline. No JVD.  No adenopathy. PULMONARY: Good air entry bilaterally.  Faint end expiratory wheezes noted, no rhonchi. CARDIOVASCULAR: S1 and S2. Regular rate and rhythm.  No rubs, murmurs or gallops heard. ABDOMEN: Benign. MUSCULOSKELETAL: No joint deformity, no clubbing, no edema.  NEUROLOGIC: No overt focal deficit, no gait disturbance, speech is fluent. SKIN: Intact,warm,dry. PSYCH: Mood and behavior normal.  Lab Results  Component Value Date   NITRICOXIDE 10 02/12/2024   Patient received nebulizer treatment with DuoNeb, noted improvement in symptoms.  Faint end expiratory wheezes resolved.      Assessment & Plan:     ICD-10-CM   1. Post-viral cough syndrome  R05.8 Nitric oxide    ipratropium-albuterol (DUONEB) 0.5-2.5 (3) MG/3ML nebulizer solution 3 mL    2. Mild intermittent asthma with acute exacerbation  J45.21      3. OSA on CPAP  G47.33       Orders Placed This Encounter  Procedures   Nitric oxide    Meds ordered this encounter  Medications   ipratropium-albuterol (DUONEB) 0.5-2.5 (3) MG/3ML nebulizer solution 3 mL   azithromycin (ZITHROMAX) 250 MG tablet    Sig: Take 2 tablets (500 mg) on  Day 1,  followed by 1 tablet (250 mg) once daily on Days 2 through 5.    Dispense:  6 each    Refill:  0   methylPREDNISolone (MEDROL DOSEPAK) 4 MG TBPK tablet    Sig: Take as directed in the package, as a taper pack    Dispense:  21 tablet    Refill:  0   ipratropium (ATROVENT) 0.02 % nebulizer solution    Sig: Take 2.5 mLs (0.5 mg total) by nebulization 4 (four) times daily as needed for wheezing or shortness of breath (cough).    Dispense:  75 mL    Refill:  6   Discussion:    Postviral Cough Syndrome/Intermittent Asthma with Exacerbation Persistent cough since December, initially treated for mycoplasma pneumonia with azithromycin and prednisone. Chest x-ray in January consistent with bronchitis. Symptoms improved with nebulizer treatments and Symbicort. Current cough likely due to acute bronchitis and post-infectious cough. Symbicort provides temporary relief however, currently underdosed. Nebulizer treatments with albuterol have been helpful. Considering ipratropium addition for better symptom control. Discussed risks of steroids and benefits of azithromycin as an anti-inflammatory. Patient prefers avoiding steroids if possible, discussed a very short course of 5 days. - Increase Symbicort to two puffs twice daily - Administer nebulizer treatment with ipratropium and albuterol - Prescribe azithromycin - Prescribe a short taper of prednisone (5 tablets) - Switch home nebulizer medication to include ipratropium, use up to four times daily - Advise to avoid mint or menthol lozenges, use non-menthol throat soothing lozenges or hard candy - Encourage hydration - Recommend Delsym for cough control -  Schedule follow-up with Dr. Belia Heman in 2-3 weeks  Obstructive Sleep Apnea Patient uses CPAP but has had difficulty due to coughing fits, leading to breaking the CPAP machine. Plans to purchase a new CPAP machine. - Encourage patient to obtain a new CPAP machine from [facility]  General Health Maintenance Patient received a flu shot after recovering from initial illness. - Ensure patient remains up-to-date with vaccinations.     Advised if symptoms do not improve or worsen, to please contact office for  sooner follow up or seek emergency care.    I spent 32 minutes of dedicated to the care of this patient on the date of this encounter to include pre-visit review of records, face-to-face time with the patient discussing conditions above, post visit ordering of testing, clinical documentation with the electronic health record, making appropriate referrals as documented, and communicating necessary findings to members of the patients care team.   C. Danice Goltz, MD Advanced Bronchoscopy PCCM [facility]    *This note was dictated using voice recognition software/Dragon.  Despite best efforts to proofread, errors can occur which can change the meaning. Any transcriptional errors that result from this process are unintentional and may not be fully corrected at the time of dictation.

## 2024-02-14 ENCOUNTER — Ambulatory Visit: Payer: Medicare Other | Admitting: Nurse Practitioner

## 2024-02-14 ENCOUNTER — Encounter: Payer: Self-pay | Admitting: Nurse Practitioner

## 2024-02-14 VITALS — BP 120/72 | HR 83 | Temp 98.4°F | Ht 64.0 in | Wt 206.6 lb

## 2024-02-14 DIAGNOSIS — E785 Hyperlipidemia, unspecified: Secondary | ICD-10-CM

## 2024-02-14 DIAGNOSIS — Z7985 Long-term (current) use of injectable non-insulin antidiabetic drugs: Secondary | ICD-10-CM

## 2024-02-14 DIAGNOSIS — Z1329 Encounter for screening for other suspected endocrine disorder: Secondary | ICD-10-CM

## 2024-02-14 DIAGNOSIS — F411 Generalized anxiety disorder: Secondary | ICD-10-CM | POA: Diagnosis not present

## 2024-02-14 DIAGNOSIS — Z1211 Encounter for screening for malignant neoplasm of colon: Secondary | ICD-10-CM

## 2024-02-14 DIAGNOSIS — E119 Type 2 diabetes mellitus without complications: Secondary | ICD-10-CM

## 2024-02-14 DIAGNOSIS — E1169 Type 2 diabetes mellitus with other specified complication: Secondary | ICD-10-CM

## 2024-02-14 DIAGNOSIS — Z853 Personal history of malignant neoplasm of breast: Secondary | ICD-10-CM | POA: Diagnosis not present

## 2024-02-14 DIAGNOSIS — J4520 Mild intermittent asthma, uncomplicated: Secondary | ICD-10-CM

## 2024-02-14 DIAGNOSIS — E559 Vitamin D deficiency, unspecified: Secondary | ICD-10-CM

## 2024-02-14 DIAGNOSIS — Z1283 Encounter for screening for malignant neoplasm of skin: Secondary | ICD-10-CM

## 2024-02-14 NOTE — Progress Notes (Signed)
 Bethanie Dicker, NP-C Phone: [phone]  Janet Austin is a 63 y.o. female who presents today to establish care.   Discussed the use of AI scribe software for clinical note transcription with the patient, who gave verbal consent to proceed.  History of Present Illness   Janet Austin is a 63 year old female with asthma and type 2 diabetes who presents to establish care.  She has been experiencing a persistent cough since December, which worsens at night and is accompanied by drainage but no fever or chills. She uses a nebulizer four times a day while sick and once a day when not sick. Current medications for asthma include Symbicort, albuterol, and two nebulizer treatments, including albuterol. She was evaluated by Pulmonology 2 days prior and is being treated with steroids and Azithromycin.   She has a history of type 2 diabetes and is currently on Mounjaro 5 mg weekly, which she has been taking since increasing from 2.5 mg after eight weeks. Her last A1c was 6.7, and she has lost 34-35 pounds since August. She does not regularly check her blood sugar but has experienced one episode of hypoglycemia since starting Mounjaro. She reports frequent urination but attributes it to high water intake.  She has a history of breast cancer, for which she underwent two mastectomies and a hysterectomy. She was on Aromasin for six years but is not currently on any cancer-related medications. She experiences moderate to severe chronic pain attributed to Aromasin use. She uses cotton prostheses after having her implants removed in 2018 due to breast implant illness.  She has a history of anxiety and is currently on buspirone, which she takes in divided doses of 5 mg twice daily. She has reduced her Xanax use to 0.5 mg as needed. She occasionally uses Ambien for insomnia, a condition she has had since childhood.  She has a history of high cholesterol and has been on Lipitor since before her breast cancer  diagnosis in 2010. She reports muscle and bone pain but does not attribute it to Lipitor.      Active Ambulatory Problems    Diagnosis Date Noted   Generalized anxiety disorder 03/11/2018   Primary insomnia 03/11/2018   Hyperlipidemia associated with type 2 diabetes mellitus (HCC) 07/27/2018   Mild intermittent asthma without complication 08/22/2018   Seasonal allergic rhinitis due to pollen 08/03/2019   Other fatigue 02/12/2020   Daytime hypersomnolence 08/24/2020   History of breast cancer in female 11/09/2020   Eczema 02/14/2021   Elevated blood pressure reading without diagnosis of hypertension 03/27/2021   BMI 40.0-44.9, adult (HCC) 01/31/2022   Vitamin D deficiency 06/16/2023   Type 2 diabetes mellitus (HCC) 06/16/2023   Resolved Ambulatory Problems    Diagnosis Date Noted   Encounter for general adult medical examination with abnormal findings 03/11/2018   Malignant neoplasm of unspecified site of unspecified female breast (HCC) 12/29/2016   Dysuria 03/11/2018   Fever blister 07/27/2018   Acute upper respiratory infection 08/22/2018   Vaginal candida 08/22/2018   Flu-like symptoms 11/30/2018   Urinary tract infection without hematuria 05/04/2019   Cough 09/23/2019   Routine cervical smear 11/15/2019   Encounter for long-term (current) use of medications 11/15/2019   Contact with and (suspected) exposure to covid-19 03/13/2020   Flu vaccine need 11/09/2020   Encounter to establish care 02/14/2021   Healthcare maintenance 02/14/2021   Acute non-recurrent pansinusitis 03/13/2021   Mild intermittent asthma with acute exacerbation 03/13/2021   Vaginal yeast infection 07/02/2021  Encounter for Medicare annual wellness exam 08/29/2021   Need for Tdap vaccination 08/29/2021   Acute pharyngitis 12/04/2021   Antibiotic-induced yeast infection 01/14/2022   Wheezing 03/07/2023   Acute bacterial rhinosinusitis 01/27/2024   Past Medical History:  Diagnosis Date   Anxiety     Arthritis    Asthma    Cancer (HCC)    Diabetes mellitus without complication (HCC)    Headache    Hyperlipidemia    PONV (postoperative nausea and vomiting)    Sleep apnea     Family History  Problem Relation Age of Onset   Hypertension Father     Social History   Socioeconomic History   Marital status: Married    Spouse name: Not on file   Number of children: Not on file   Years of education: Not on file   Highest education level: Some college, no degree  Occupational History   Not on file  Tobacco Use   Smoking status: Never    Passive exposure: Never   Smokeless tobacco: Never  Vaping Use   Vaping status: Never Used  Substance and Sexual Activity   Alcohol use: No   Drug use: No   Sexual activity: Not on file  Other Topics Concern   Not on file  Social History Narrative   Not on file   Social Drivers of Health   Financial Resource Strain: Low Risk  (12/20/2023)   Overall Financial Resource Strain (CARDIA)    Difficulty of Paying Living Expenses: Not very hard  Food Insecurity: No Food Insecurity (12/20/2023)   Hunger Vital Sign    Worried About Running Out of Food in the Last Year: Never true    Ran Out of Food in the Last Year: Never true  Transportation Needs: No Transportation Needs (12/20/2023)   PRAPARE - Administrator, Civil Service (Medical): No    Lack of Transportation (Non-Medical): No  Physical Activity: Inactive (12/20/2023)   Exercise Vital Sign    Days of Exercise per Week: 0 days    Minutes of Exercise per Session: 10 min  Stress: Stress Concern Present (12/20/2023)   [employer] of Occupational Health - Occupational Stress Questionnaire    Feeling of Stress : To some extent  Social Connections: Socially Integrated (12/20/2023)   Social Connection and Isolation Panel [NHANES]    Frequency of Communication with Friends and Family: More than three times a week    Frequency of Social Gatherings with Friends and Family: Patient  declined    Attends Religious Services: More than 4 times per year    Active Member of [employer] or Organizations: Yes    Attends Banker Meetings: Patient declined    Marital Status: Married  Catering manager Violence: Not At Risk (06/13/2023)   Humiliation, Afraid, Rape, and Kick questionnaire    Fear of Current or Ex-Partner: No    Emotionally Abused: No    Physically Abused: No    Sexually Abused: No    ROS  General:  Negative for unexplained weight loss, fever Skin: Negative for new or changing mole, sore that won't heal HEENT: Negative for trouble hearing, trouble seeing, ringing in ears, mouth sores, hoarseness, change in voice, dysphagia. CV:  Negative for chest pain, dyspnea, edema, palpitations Resp: Negative for cough, dyspnea, hemoptysis GI: Negative for nausea, vomiting, diarrhea, constipation, abdominal pain, melena, hematochezia. GU: Negative for dysuria, incontinence, urinary hesitance, hematuria, vaginal or penile discharge, polyuria, sexual difficulty, lumps in testicle or breasts MSK:  Negative for muscle cramps or joint pain or swelling Neuro: Negative for headaches, weakness, numbness, dizziness, passing out/fainting Psych: Negative for depression, memory problems  Objective  Physical Exam Vitals:   02/14/24 1355  BP: 120/72  Pulse: 83  Temp: 98.4 F (36.9 C)  SpO2: 97%    BP Readings from Last 3 Encounters:  02/14/24 120/72  02/12/24 120/82  01/27/24 102/72   Wt Readings from Last 3 Encounters:  02/14/24 206 lb 9.6 oz (93.7 kg)  02/12/24 207 lb 9.6 oz (94.2 kg)  01/27/24 210 lb (95.3 kg)    Physical Exam Constitutional:      General: She is not in acute distress.    Appearance: Normal appearance.  HENT:     Head: Normocephalic.  Cardiovascular:     Rate and Rhythm: Normal rate and regular rhythm.     Heart sounds: Normal heart sounds.  Pulmonary:     Effort: Pulmonary effort is normal.     Breath sounds: Normal breath sounds.   Skin:    General: Skin is warm and dry.  Neurological:     General: No focal deficit present.     Mental Status: She is alert.  Psychiatric:        Mood and Affect: Mood normal.        Behavior: Behavior normal.     Assessment/Plan:   Type 2 diabetes mellitus without complication, without long-term current use of insulin (HCC) Assessment & Plan: Diabetes is well controlled with Mounjaro 5mg , with the last A1c at 6.7. Significant weight loss has been achieved on Mounjaro, and continuation is desired. Discussed potential benefits of increasing the dose to 7.5mg . Order fasting labs to check current A1c. Continue Mounjaro 5mg  weekly, considering an increase to 7.5mg . She has another month of 5 mg at home, she will contact in the next several weeks if she wishes to increase her dose or needs a refill on the 5 mg. Encouraged to continue healthy diet and regular exercise.   Orders: -     CBC with Differential/Platelet; Future -     Hemoglobin A1c; Future  Generalized anxiety disorder Assessment & Plan: Anxiety is managed with Buspirone 5mg  and occasional Xanax 0.5mg . Reports feeling "too level" at times with Buspirone. Continue Buspirone 5mg  twice daily and Xanax 0.5mg  as needed. PDMP reviewed. Non-Opioid Controlled Substance Agreement signed today. Refill not needed at this time, she will contact when she does.    Mild intermittent asthma without complication Assessment & Plan: Persistent cough and increased nebulizer use are likely due to recent illness and postnasal drainage, with no current fever or chills. Continue Symbicort and Albuterol as prescribed. Advised to complete antibiotics and steroids as prescribed and to contact if symptoms are worsening or persisting.    History of breast cancer in female Assessment & Plan: Following mastectomies and hysterectomy, Aromasin was used for 6 years, with chronic pain reported since its use. Follow up no longer needed by Oncology.     Hyperlipidemia associated with type 2 diabetes mellitus (HCC) Assessment & Plan: Hyperlipidemia is managed with Lipitor 10 mg daily, with no new abdominal pain or muscle aches. Continue Lipitor as prescribed. She will return for a fasting lipid panel. The 10-year ASCVD risk score (Arnett DK, et al., 2019) is: 7.1%.   Orders: -     Comprehensive metabolic panel; Future -     Lipid panel; Future  Vitamin D deficiency -     VITAMIN D 25 Hydroxy (Vit-D Deficiency, Fractures); Future  Thyroid disorder screen -     TSH; Future  Screen for colon cancer -     Ambulatory referral to Gastroenterology  Screening for skin cancer -     Ambulatory referral to Dermatology    Return for fasting labs then in 3 months for follow up.   Bethanie Dicker, NP-C [facility]

## 2024-02-14 NOTE — Assessment & Plan Note (Signed)
 Persistent cough and increased nebulizer use are likely due to recent illness and postnasal drainage, with no current fever or chills. Continue Symbicort and Albuterol as prescribed. Advised to complete antibiotics and steroids as prescribed and to contact if symptoms are worsening or persisting.

## 2024-02-14 NOTE — Assessment & Plan Note (Signed)
 Hyperlipidemia is managed with Lipitor 10 mg daily, with no new abdominal pain or muscle aches. Continue Lipitor as prescribed. She will return for a fasting lipid panel. The 10-year ASCVD risk score (Arnett DK, et al., 2019) is: 7.1%.

## 2024-02-14 NOTE — Assessment & Plan Note (Signed)
 Diabetes is well controlled with Mounjaro 5mg , with the last A1c at 6.7. Significant weight loss has been achieved on Mounjaro, and continuation is desired. Discussed potential benefits of increasing the dose to 7.5mg . Order fasting labs to check current A1c. Continue Mounjaro 5mg  weekly, considering an increase to 7.5mg . She has another month of 5 mg at home, she will contact in the next several weeks if she wishes to increase her dose or needs a refill on the 5 mg. Encouraged to continue healthy diet and regular exercise.

## 2024-02-14 NOTE — Assessment & Plan Note (Signed)
 Anxiety is managed with Buspirone 5mg  and occasional Xanax 0.5mg . Reports feeling "too level" at times with Buspirone. Continue Buspirone 5mg  twice daily and Xanax 0.5mg  as needed. PDMP reviewed. Non-Opioid Controlled Substance Agreement signed today. Refill not needed at this time, she will contact when she does.

## 2024-02-14 NOTE — Assessment & Plan Note (Addendum)
 Following mastectomies and hysterectomy, Aromasin was used for 6 years, with chronic pain reported since its use. Follow up no longer needed by Oncology.

## 2024-02-15 ENCOUNTER — Encounter: Payer: Self-pay | Admitting: Pulmonary Disease

## 2024-02-15 ENCOUNTER — Encounter: Payer: Self-pay | Admitting: Nurse Practitioner

## 2024-02-17 ENCOUNTER — Other Ambulatory Visit: Payer: Medicare Other

## 2024-02-17 DIAGNOSIS — Z1329 Encounter for screening for other suspected endocrine disorder: Secondary | ICD-10-CM | POA: Diagnosis not present

## 2024-02-17 DIAGNOSIS — E559 Vitamin D deficiency, unspecified: Secondary | ICD-10-CM | POA: Diagnosis not present

## 2024-02-17 DIAGNOSIS — E785 Hyperlipidemia, unspecified: Secondary | ICD-10-CM

## 2024-02-17 DIAGNOSIS — E1169 Type 2 diabetes mellitus with other specified complication: Secondary | ICD-10-CM | POA: Diagnosis not present

## 2024-02-17 DIAGNOSIS — E119 Type 2 diabetes mellitus without complications: Secondary | ICD-10-CM | POA: Diagnosis not present

## 2024-02-17 LAB — CBC WITH DIFFERENTIAL/PLATELET
Basophils Absolute: 0.1 10*3/uL (ref 0.0–0.1)
Basophils Relative: 1.1 % (ref 0.0–3.0)
Eosinophils Absolute: 0 10*3/uL (ref 0.0–0.7)
Eosinophils Relative: 0.2 % (ref 0.0–5.0)
HCT: 43.3 % (ref 36.0–46.0)
Hemoglobin: 14.3 g/dL (ref 12.0–15.0)
Lymphocytes Relative: 22.7 % (ref 12.0–46.0)
Lymphs Abs: 2.3 10*3/uL (ref 0.7–4.0)
MCHC: 33 g/dL (ref 30.0–36.0)
MCV: 86.7 fl (ref 78.0–100.0)
Monocytes Absolute: 0.7 10*3/uL (ref 0.1–1.0)
Monocytes Relative: 6.5 % (ref 3.0–12.0)
Neutro Abs: 7 10*3/uL (ref 1.4–7.7)
Neutrophils Relative %: 69.5 % (ref 43.0–77.0)
Platelets: 300 10*3/uL (ref 150.0–400.0)
RBC: 5 Mil/uL (ref 3.87–5.11)
RDW: 14.5 % (ref 11.5–15.5)
WBC: 10.1 10*3/uL (ref 4.0–10.5)

## 2024-02-17 LAB — LIPID PANEL
Cholesterol: 161 mg/dL (ref 0–200)
HDL: 50 mg/dL (ref >39.00)
LDL Cholesterol: 97 mg/dL (ref 0–99)
NonHDL: 111.38
Total CHOL/HDL Ratio: 3
Triglycerides: 74 mg/dL (ref 0.0–149.0)
VLDL: 14.8 mg/dL (ref 0.0–40.0)

## 2024-02-17 LAB — COMPREHENSIVE METABOLIC PANEL
ALT: 13 U/L (ref 0–35)
AST: 10 U/L (ref 0–37)
Albumin: 3.9 g/dL (ref 3.5–5.2)
Alkaline Phosphatase: 74 U/L (ref 39–117)
BUN: 16 mg/dL (ref 6–23)
CO2: 25 meq/L (ref 19–32)
Calcium: 8.8 mg/dL (ref 8.4–10.5)
Chloride: 107 meq/L (ref 96–112)
Creatinine, Ser: 0.69 mg/dL (ref 0.40–1.20)
GFR: 92.58 mL/min (ref >60.00)
Glucose, Bld: 85 mg/dL (ref 70–99)
Potassium: 4.3 meq/L (ref 3.5–5.1)
Sodium: 142 meq/L (ref 135–145)
Total Bilirubin: 0.4 mg/dL (ref 0.2–1.2)
Total Protein: 6.6 g/dL (ref 6.0–8.3)

## 2024-02-17 LAB — VITAMIN D 25 HYDROXY (VIT D DEFICIENCY, FRACTURES): VITD: 29.63 ng/mL — ABNORMAL LOW (ref 30.00–100.00)

## 2024-02-17 LAB — HEMOGLOBIN A1C: Hgb A1c MFr Bld: 5.7 % (ref 4.6–6.5)

## 2024-02-17 LAB — TSH: TSH: 2.26 u[IU]/mL (ref 0.35–5.50)

## 2024-02-18 ENCOUNTER — Encounter: Payer: Self-pay | Admitting: Nurse Practitioner

## 2024-02-18 MED ORDER — TIRZEPATIDE 7.5 MG/0.5ML ~~LOC~~ SOAJ: 7.5000 mg | SUBCUTANEOUS | 0 refills | Status: DC

## 2024-02-20 ENCOUNTER — Other Ambulatory Visit: Payer: Self-pay

## 2024-02-20 ENCOUNTER — Telehealth: Payer: Self-pay

## 2024-02-20 DIAGNOSIS — Z800 Family history of malignant neoplasm of digestive organs: Secondary | ICD-10-CM

## 2024-02-20 DIAGNOSIS — Z1211 Encounter for screening for malignant neoplasm of colon: Secondary | ICD-10-CM

## 2024-02-20 MED ORDER — SUTAB 1479-225-188 MG PO TABS: 12.0000 | ORAL_TABLET | Freq: Two times a day (BID) | ORAL | 0 refills | Status: AC

## 2024-02-20 NOTE — Telephone Encounter (Signed)
 Gastroenterology Pre-Procedure Review  Request Date: 04/01/24 Requesting Physician: Dr. Allegra Lai  PATIENT REVIEW QUESTIONS: The patient responded to the following health history questions as indicated:    Janet Austin has been advised to stop 7 days prior to colonoscopy.  1. Are you having any GI issues? no 2. Do you have a personal history of Polyps? no 3. Do you have a family history of Colon Cancer or Polyps? yes (FATHER COLON CANCER) 4. Diabetes Mellitus? no 5. Joint replacements in the past 12 months?no 6. Major health problems in the past 3 months?no 7. Any artificial heart valves, MVP, or defibrillator?no    MEDICATIONS & ALLERGIES:    Patient reports the following regarding taking any anticoagulation/antiplatelet therapy:   Plavix, Coumadin, Eliquis, Xarelto, Lovenox, Pradaxa, Brilinta, or Effient? no Aspirin? no  Patient confirms/reports the following medications:  Current Outpatient Medications  Medication Sig Dispense Refill   Sodium Sulfate-Mag Sulfate-KCl (SUTAB) [phone] MG TABS Take 12 tablets by mouth 2 (two) times daily for 1 day. 24 tablet 0   albuterol (PROVENTIL) (2.5 MG/3ML) 0.083% nebulizer solution Take 3 mLs (2.5 mg total) by nebulization every 6 (six) hours as needed for wheezing or shortness of breath. 75 mL 0   albuterol (VENTOLIN HFA) 108 (90 Base) MCG/ACT inhaler Inhale 2 puffs into the lungs every 6 (six) hours as needed for wheezing or shortness of breath. 54 g 3   ALPRAZolam (XANAX) 0.5 MG tablet Take 1 tablet (0.5 mg total) by mouth as needed for anxiety (up to once daily, for BREAKTHROUGH ANXIETY). 20 tablet 1   atorvastatin (LIPITOR) 10 MG tablet Take 1 tablet (10 mg total) by mouth daily. 90 tablet 0   budesonide-formoterol (SYMBICORT) 160-4.5 MCG/ACT inhaler Inhale 2 puffs into the lungs daily. 10.2 g 1   busPIRone (BUSPAR) 5 MG tablet TAKE 1 TABLET(5 MG) BY MOUTH THREE TIMES DAILY 90 tablet 1   ipratropium (ATROVENT) 0.02 % nebulizer solution  Take 2.5 mLs (0.5 mg total) by nebulization 4 (four) times daily as needed for wheezing or shortness of breath (cough). 75 mL 6   methylPREDNISolone (MEDROL DOSEPAK) 4 MG TBPK tablet Take as directed in the package, as a taper pack 21 tablet 0   promethazine-dextromethorphan (PROMETHAZINE-DM) 6.25-15 MG/5ML syrup Take 5 mLs by mouth at bedtime as needed for cough. 118 mL 0   tirzepatide (MOUNJARO) 7.5 MG/0.5ML Pen Inject 7.5 mg into the skin once a week. 2 mL 0   zolpidem (AMBIEN) 10 MG tablet TAKE ONE TABLET BY MOUTH ONE TIME DAILY AT BEDTIME AS NEEDED FOR SLEEP 30 tablet 3   No current facility-administered medications for this visit.    Patient confirms/reports the following allergies:  Allergies  Allergen Reactions   Other Itching and Rash   Asa [Aspirin]    Gabapentin Other (See Comments)    Eye twitch   Lyrica [Pregabalin] Other (See Comments)    Altered mental status   Tape Rash    Patient states she had blisters and it was very itchy, ok to use paper tape and tegaderm     No orders of the defined types were placed in this encounter.   AUTHORIZATION INFORMATION Primary Insurance: 1D#: Group #:  Secondary Insurance: 1D#: Group #:  SCHEDULE INFORMATION: Date: 04/01/24 Time: Location: [facility]

## 2024-02-21 DIAGNOSIS — J45998 Other asthma: Secondary | ICD-10-CM | POA: Diagnosis not present

## 2024-02-26 ENCOUNTER — Ambulatory Visit: Payer: Medicare Other | Admitting: Internal Medicine

## 2024-02-26 ENCOUNTER — Encounter: Payer: Self-pay | Admitting: Internal Medicine

## 2024-02-26 VITALS — BP 116/74 | HR 81 | Temp 97.6°F | Ht 64.0 in

## 2024-02-26 DIAGNOSIS — E669 Obesity, unspecified: Secondary | ICD-10-CM | POA: Diagnosis not present

## 2024-02-26 DIAGNOSIS — J4520 Mild intermittent asthma, uncomplicated: Secondary | ICD-10-CM

## 2024-02-26 DIAGNOSIS — J4521 Mild intermittent asthma with (acute) exacerbation: Secondary | ICD-10-CM

## 2024-02-26 DIAGNOSIS — R5381 Other malaise: Secondary | ICD-10-CM | POA: Diagnosis not present

## 2024-02-26 DIAGNOSIS — G4733 Obstructive sleep apnea (adult) (pediatric): Secondary | ICD-10-CM

## 2024-02-26 DIAGNOSIS — Z6835 Body mass index (BMI) 35.0-35.9, adult: Secondary | ICD-10-CM

## 2024-02-26 LAB — NITRIC OXIDE: Nitric Oxide: 10

## 2024-02-26 NOTE — Progress Notes (Signed)
 Name: Janet Austin MRN: [id number] DOB: 1961/04/24     SYNOPSIS 63 year old female never smoker followed for mild intermittent asthma, pulmonary nodules (serial follow up , resolved on CT chest 12/2016)  Medical history significant for breast cancer 2010 s/p bilateral mastectomy ,and chemo .      TEST/EVENTS :  Pulmonary function testing done July 2018 showed no airflow obstruction with FEV1 at 81%, ratio 86, FVC 77% no significant bronchodilator response, DLCO 52%, mid flow obstruction and reversibility noted  CXR 10/2016 clear lungs  diagnosed with obstructive sleep apnea Sleep study documents and reports reviewed in detail with the patient She snored 415 times AHI was 20 She desatted 134 times Lowest O2 sat was 75%    CC Follow up asthma Follow-up OSA    HPI ASTHMA EXACERBATION LAST 1 month with cough Several rounds of ABX and prednisone Feels much better today FENO 10  No evidence of heart failure at this time No evidence or signs of infection at this time No respiratory distress No fevers, chills, nausea, vomiting, diarrhea No evidence of lower extremity edema No evidence hemoptysis   FOLLOW UP OSA Excellent compliance report reviewed today in detail with patient 100% for days 100% for greater than 4 hours AHI reduced to 0.4 Auto CPAP 5-12 Patient uses and benefits from therapy Using CPAP nightly and with naps Pressure setting is comfortable and is sleeping well.     PAST MEDICAL HISTORY :   has a past medical history of Anxiety, Arthritis, Asthma, Cancer (HCC), Diabetes mellitus without complication (HCC), Headache, Hyperlipidemia, PONV (postoperative nausea and vomiting), and Sleep apnea.  has a past surgical history that includes Total abdominal hysterectomy; Bilateral total mastectomy with axillary lymph node dissection; Breast surgery; Mastectomy; Knee arthroscopy (Right); and Colonoscopy with propofol (N/A, 05/29/2016). Prior to Admission  medications   Medication Sig Start Date End Date Taking? Authorizing Provider  albuterol (VENTOLIN HFA) 108 (90 Base) MCG/ACT inhaler Inhale 2 puffs into the lungs every 6 (six) hours as needed for wheezing or shortness of breath. 02/12/20  Yes Boscia, Heather E, NP  ALPRAZolam (XANAX) 1 MG tablet Take 1 tablet (1 mg total) by mouth at bedtime as needed for anxiety. 11/09/20  Yes Boscia, Kathlynn Grate, NP  atorvastatin (LIPITOR) 10 MG tablet Take 1 tablet (10 mg total) by mouth daily. 11/09/20  Yes Boscia, Kathlynn Grate, NP  budesonide-formoterol (SYMBICORT) 160-4.5 MCG/ACT inhaler Inhale 2 puffs into the lungs 2 (two) times daily. 08/24/20  Yes Parrett, Tammy S, NP  clobetasol ointment (TEMOVATE) 0.05 % Apply 1 application topically daily as needed. 11/03/18  Yes Scarboro, Coralee North, NP  cyclobenzaprine (FLEXERIL) 5 MG tablet Take 5 mg by mouth 3 (three) times daily as needed for muscle spasms.   Yes [provider]  triamcinolone ointment (KENALOG) 0.5 % Apply 1 application topically 2 (two) times daily. 11/03/18  Yes Scarboro, Coralee North, NP  valACYclovir (VALTREX) 1000 MG tablet Take 1 tablet (1,000 mg total) by mouth 2 (two) times daily. 02/12/20  Yes Boscia, Kathlynn Grate, NP  zolpidem (AMBIEN) 10 MG tablet Take 1 tablet (10 mg total) by mouth at bedtime as needed for sleep. 11/09/20  Yes Carlean Jews, NP   Allergies  Allergen Reactions   Other Itching and Rash   Asa [Aspirin]    Gabapentin Other (See Comments)    Eye twitch   Lyrica [Pregabalin] Other (See Comments)    Altered mental status   Tape Rash    Patient states  she had blisters and it was very itchy, ok to use paper tape and tegaderm       BP 116/74 (BP Location: Right Arm, Patient Position: Sitting, Cuff Size: Normal)   Pulse 81   Temp 97.6 F (36.4 C) (Temporal)   Ht 5\' 4"  (1.626 m)   SpO2 96%   BMI 35.46 kg/m    Review of Systems: Gen:  Denies  fever, sweats, chills weight loss  HEENT: Denies blurred vision, double  vision, ear pain, eye pain, hearing loss, nose bleeds, sore throat Cardiac:  No dizziness, chest pain or heaviness, chest tightness,edema, No JVD Resp:   No cough, -sputum production, -shortness of breath,-wheezing, -hemoptysis,  Other:  All other systems negative   Physical Examination:   General Appearance: No distress  EYES PERRLA, EOM intact.   NECK Supple, No JVD Pulmonary: normal breath sounds, No wheezing.  CardiovascularNormal S1,S2.  No m/r/g.   Abdomen: Benign, Soft, non-tender. Neurology UE/LE 5/5 strength, no focal deficits Ext pulses intact, cap refill intact ALL OTHER ROS ARE NEGATIVE    ASSESSMENT AND PLAN SYNOPSIS   63 year old obese white female seen today for follow-up assessment for asthma and OSA in the setting of obesity and deconditioned state,   Recent asthma exacerbation Symptoms have improved with several rounds of antibiotics and prednisone Symbicort as maintenance inhaler-continue as prescribed And decrease his Symbicort 1 puff daily as previously prescribed Avoid Allergens and Irritants Avoid secondhand smoke Avoid SICK contacts Recommend  Masking  when appropriate Recommend Keep up-to-date with vaccinations  Assessment of OSA Diagnosis of OSA with AHI of 20 Started on auto CPAP 5-12 Patient has excellent compliance report Discussed in detail with patient OSA is well-controlled with CPAP Continue current prescription Patient uses and benefits from therapy Using CPAP nightly.  pressure setting is comfortable and she is sleeping well.  Patient Instructions Continue to use CPAP every night, minimum of 4-6 hours a night.  Change equipment every 30 days or as directed by DME.  Wash your tubing with warm soap and water daily, hang to dry. Wash humidifier portion weekly. Use bottled, distilled water and change daily   Be aware of reduced alertness and do not drive or operate heavy machinery if experiencing this or drowsiness.  Exercise  encouraged, as tolerated. Encouraged proper weight management.  Important to get eight or more hours of sleep  Limiting the use of the computer and television before bedtime.  Decrease naps during the day, so night time sleep will become enhanced.  Limit caffeine, and sleep deprivation.  HTN, stroke, uncontrolled diabetes and heart failure are potential risk factors.  Risk of untreated sleep apnea including cardiac arrhthymias, stroke, DM, pulm HTN.   Obesity -recommend significant weight loss -recommend changing diet  Deconditioned state -Recommend increased daily activity and exercise   MEDICATION ADJUSTMENTS/LABS AND TESTS ORDERED: Continue to use Symbicort 2 puffs in a.m. 2 puffs in p.m. for the next 2 weeks Then decrease Symbicort 1 puff daily as tolerated Continue to use albuterol and albuterol nebulizers as needed Continue CPAP as prescribed Avoid Allergens and Irritants Avoid secondhand smoke Avoid SICK contacts Recommend  Masking  when appropriate Recommend Keep up-to-date with vaccinations  CURRENT MEDICATIONS REVIEWED AT LENGTH WITH PATIENT TODAY   Patient  satisfied with Plan of action and management. All questions answered  Follow up in 6 months   I spent a total of 43 minutes reviewing chart data, face-to-face evaluation with the patient, counseling and coordination of care as detailed above.  Lucie Leather, M.D.  [facility]  Medical Director [facility] Medical Director [facility] Cardio-Pulmonary Department

## 2024-02-26 NOTE — Patient Instructions (Addendum)
  Continue CPAP as prescribed  Avoid Allergens and Irritants Avoid secondhand smoke Avoid SICK contacts Recommend  Masking  when appropriate Recommend Keep up-to-date with vaccinations   Be aware of reduced alertness and do not drive or operate heavy machinery if experiencing this or drowsiness.  Exercise encouraged, as tolerated. Encouraged proper weight management.  Important to get eight or more hours of sleep  Limiting the use of the computer and television before bedtime.  Decrease naps during the day, so night time sleep will become enhanced.  Limit caffeine, and sleep deprivation.   Continue to use Symbicort 2 puffs in a.m. 2 puffs in p.m. for the next 2 weeks Then decrease Symbicort 1 puff daily as tolerated Continue to use albuterol and albuterol nebulizers as needed

## 2024-03-01 ENCOUNTER — Other Ambulatory Visit: Payer: Self-pay | Admitting: Family Medicine

## 2024-03-01 DIAGNOSIS — F411 Generalized anxiety disorder: Secondary | ICD-10-CM

## 2024-03-03 ENCOUNTER — Other Ambulatory Visit: Payer: Self-pay

## 2024-03-03 DIAGNOSIS — F411 Generalized anxiety disorder: Secondary | ICD-10-CM

## 2024-03-03 NOTE — Telephone Encounter (Signed)
 error

## 2024-03-05 ENCOUNTER — Other Ambulatory Visit: Payer: Self-pay | Admitting: Family Medicine

## 2024-03-05 DIAGNOSIS — F411 Generalized anxiety disorder: Secondary | ICD-10-CM

## 2024-03-10 ENCOUNTER — Telehealth: Payer: Self-pay

## 2024-03-10 ENCOUNTER — Other Ambulatory Visit: Payer: Self-pay

## 2024-03-10 MED ORDER — TIRZEPATIDE 7.5 MG/0.5ML ~~LOC~~ SOAJ: 7.5000 mg | SUBCUTANEOUS | 0 refills | Status: DC

## 2024-03-10 MED ORDER — CLENPIQ 10-3.5-12 MG-GM -GM/160ML PO SOLN: 1.0000 | Freq: Once | ORAL | 0 refills | Status: AC

## 2024-03-10 NOTE — Telephone Encounter (Signed)
 Reviewed previous colonoscopy report, she has family history of colon polyps but not colon cancer.  I do not have any details of her family history of colon polyps, not sure why she would need another colonoscopy earlier than 05/2026.  Her last colonoscopy in 05/2016 by Dr. Marva Panda was normal  Please check with the patient about family history of colon polyps  RV

## 2024-03-10 NOTE — Telephone Encounter (Signed)
 Copied from CRM [id number]. Topic: General - Other >> Mar 10, 2024  3:29 PM Fredrich Romans wrote: Reason for CRM: Patient returned call to [facility] [place].Patient would like a return call

## 2024-03-10 NOTE — Telephone Encounter (Signed)
 Patient contacted office to discuss colonoscopy prep options.  Sutab prep is not covered by her insurance.  Informed her that ClenPiq and Suprep are low volume preps. She asked which prep I would use.  I informed her that I preferred ClenPiq because you can drink it from the bottle and chase it with water as you drink. She opted for ClenPiq. Instructions for all preps were provided in earlier mychart message to her dated 02/21/24. Checked the  [employer] and discussed savings options.  She has Medicare-not for sure if the savings coupon will help. Pt has invisalign and wanted to know if she could remove them when she got to her procedure.  Informed her that I don't think she will need to remove them.  I called Vikki in Endo to confirm. She confirmed. Additional medication questions were if she could take her cholesterol medication and xanax the evening before her procedure.  Informed her that this would be ok to take the night before.  She wanted to know if she could bring her inhaler.  I informed her that she can bring her inhaler.  She then thanked me for being so nice. Before ending the call she had another question about the cost of the colonoscopy if polyp was to be removed.  Informed her that we would not be able to give the exact cost until the procedure is done and billed.  She then asked if there was anyway around being billed for the polyp whereas insurance would pay.  I told her no, however when she received her bill she could call the number on the back and make payment arrangements.  I explained to her that we have financial assistance that can help with the bill if she would like I could send her the forms.  Patient is very anxious about her colonoscopy procedure and I informed her that I would let Dr. Allegra Lai know that she is feeling anxious prior to procedure. She then expressed that she was displeased with the way I was replying to her questions and asked if she could speak with a Production designer, theatre/television/film.  She took my friendliness and chuckles as me being cute.  Once she expressed this to me-I apologized to her and said I genuinely meant no disrespect at all by her and I'm sorry you mistaken my happy disposition for trying to be cute and asked my office mgr to assist me further with the call. Ginger spoke to patient and apologized as well.  She clarified the billing questions and listened to patients concerns.  Ginger sent a mychart message to patient  and added Dr. Allegra Lai to pts care team to contact her or Dr. Allegra Lai directly if she has any questions about her procedure. Patient is very anxious about her colonoscopy procedure and I informed her that I would let Dr. Allegra Lai know that she is anxious prior to procedure. Thanks, [place], [place]

## 2024-03-10 NOTE — Telephone Encounter (Signed)
 Spoke with pt in regards to family history, she stated that she will call and speak with her mother to see if her father had polyps removed she stated she knows at age 64 he was diagnosed with colon cancer and 18 months later he passed away she stated that ever since then she was advised to get her colonoscopies every 5 years. She stated she just missed her most recent one due to her husband's cancer diagnosis. She also mentioned that she is not sure how she feels about this GI office as the woman who called her in regards to prep 'laughed at her and was mean." I asked her if she was interested in a new referral somewhere else and she stated no that she will stick it out as Bethanie Dicker, NP advised her that this Dr. Was really good. She stated she will send a mychart message tomorrow when she finds out if and when father had polyps.

## 2024-03-10 NOTE — Telephone Encounter (Signed)
 VM left informing pt to CB

## 2024-03-11 NOTE — Telephone Encounter (Signed)
 Pt has now updated via mychart message stating that 6 years before her fathers colon cancer Dx he had polyps removed (info is not a 100% as she states her mother is not 100% sure)

## 2024-03-12 NOTE — Telephone Encounter (Signed)
 Called  Pt to make sure she was aware of Dr. Verdis Prime message per Malva Cogan request, I noticed another note being in progress from Dr. Verdis Prime office and pt answered sounding upset stating she just got off the phone with Dr. Verdis Prime office and she is very upset at the fact a Dr. Who is [place] the [place] and never seen her before is advising she not get a colonoscopy when a previous GI provider advised her and her sister's get colonoscopies every 3-5 years. She stated she should have just went to [facility] because Dr. Verdis Prime office mentioned an elective surgery and that her insurance would not cover it. Pt stated she was sorry for venting to me as she thought Kacy and I are sweet and helpful but she just can't believe that Bethanie Dicker, NP spoke highly of this office and she is not happy. I asked pt did she prefer another referral to another office and she stated she wanted to get back to me on that sometime next week via mychart. She stated she was on her way to the hospital now to speak to someone in regards to this. She apologized again and thanked me for checking in.

## 2024-03-12 NOTE — Telephone Encounter (Signed)
 Called and informed patient Dr. Allegra Lai recommendations and she states that she finds that really hard to believe when this doctor has never seen her in the office and giving her this recommendations she states her last GI doctor told her she needed to have a colonoscopy every 3 to 5 years. Then she said her dad oncologist told her she needed colonoscopy every 3 to 5 years. She states she would like Dr. Allegra Lai to call her. Informed her dr. Allegra Lai was on vacation and I did not know if she would be able to. She then started rasing her voice at me again and said if the provider feels that way then her insurance is not going to pay for the procedure. She said have another provider call her then. Providers do not like when other providers change providers. I told her she could talk to my office manager and transferred the call to Ginger

## 2024-03-12 NOTE — Telephone Encounter (Signed)
 This is helpful. My recommendation is for her to undergo colonoscopy in 05/2026 based on her family history. Father with colon cancer at age 63 does not increase her risk for colon cancer. If she is not comfortable to wait 2 more years, I'm happy to do her screening colonoscopy anytime soon  RV

## 2024-03-15 ENCOUNTER — Other Ambulatory Visit: Payer: Self-pay | Admitting: Nurse Practitioner

## 2024-03-15 DIAGNOSIS — F411 Generalized anxiety disorder: Secondary | ICD-10-CM

## 2024-03-16 ENCOUNTER — Other Ambulatory Visit: Payer: Self-pay

## 2024-03-16 DIAGNOSIS — F411 Generalized anxiety disorder: Secondary | ICD-10-CM

## 2024-03-17 MED ORDER — ALPRAZOLAM 0.5 MG PO TABS: 0.5000 mg | ORAL_TABLET | Freq: Every day | ORAL | 2 refills | Status: DC | PRN

## 2024-03-19 ENCOUNTER — Ambulatory Visit: Payer: Medicare Other | Admitting: Family Medicine

## 2024-03-23 DIAGNOSIS — J45998 Other asthma: Secondary | ICD-10-CM | POA: Diagnosis not present

## 2024-03-25 ENCOUNTER — Encounter: Payer: Self-pay | Admitting: Gastroenterology

## 2024-03-31 ENCOUNTER — Other Ambulatory Visit: Payer: Self-pay | Admitting: Family Medicine

## 2024-03-31 ENCOUNTER — Encounter: Payer: Self-pay | Admitting: Gastroenterology

## 2024-03-31 DIAGNOSIS — F411 Generalized anxiety disorder: Secondary | ICD-10-CM

## 2024-03-31 DIAGNOSIS — E785 Hyperlipidemia, unspecified: Secondary | ICD-10-CM

## 2024-04-01 ENCOUNTER — Encounter: Payer: Self-pay | Admitting: Gastroenterology

## 2024-04-01 ENCOUNTER — Ambulatory Visit: Admitting: Certified Registered"

## 2024-04-01 ENCOUNTER — Ambulatory Visit
Admission: RE | Admit: 2024-04-01 | Discharge: 2024-04-01 | Disposition: A | Discharge status: Home / Self Care | Attending: Gastroenterology | Admitting: Gastroenterology

## 2024-04-01 ENCOUNTER — Other Ambulatory Visit: Payer: Self-pay

## 2024-04-01 ENCOUNTER — Encounter
Admission: RE | Disposition: A | Discharge status: Home / Self Care | Payer: Self-pay | Source: Home / Self Care | Attending: Gastroenterology

## 2024-04-01 DIAGNOSIS — K640 First degree hemorrhoids: Secondary | ICD-10-CM | POA: Insufficient documentation

## 2024-04-01 DIAGNOSIS — Z800 Family history of malignant neoplasm of digestive organs: Secondary | ICD-10-CM | POA: Insufficient documentation

## 2024-04-01 DIAGNOSIS — Z1211 Encounter for screening for malignant neoplasm of colon: Secondary | ICD-10-CM | POA: Diagnosis not present

## 2024-04-01 DIAGNOSIS — K635 Polyp of colon: Secondary | ICD-10-CM

## 2024-04-01 DIAGNOSIS — G4730 Sleep apnea, unspecified: Secondary | ICD-10-CM | POA: Insufficient documentation

## 2024-04-01 DIAGNOSIS — J45909 Unspecified asthma, uncomplicated: Secondary | ICD-10-CM | POA: Insufficient documentation

## 2024-04-01 DIAGNOSIS — D123 Benign neoplasm of transverse colon: Secondary | ICD-10-CM | POA: Insufficient documentation

## 2024-04-01 DIAGNOSIS — Z7985 Long-term (current) use of injectable non-insulin antidiabetic drugs: Secondary | ICD-10-CM | POA: Diagnosis not present

## 2024-04-01 DIAGNOSIS — E119 Type 2 diabetes mellitus without complications: Secondary | ICD-10-CM | POA: Insufficient documentation

## 2024-04-01 DIAGNOSIS — F419 Anxiety disorder, unspecified: Secondary | ICD-10-CM | POA: Insufficient documentation

## 2024-04-01 DIAGNOSIS — E785 Hyperlipidemia, unspecified: Secondary | ICD-10-CM | POA: Diagnosis not present

## 2024-04-01 HISTORY — PX: COLONOSCOPY: SHX5424

## 2024-04-01 LAB — GLUCOSE, CAPILLARY: Glucose-Capillary: 87 mg/dL (ref 70–99)

## 2024-04-01 SURGERY — COLONOSCOPY
Anesthesia: General

## 2024-04-01 MED ORDER — SODIUM CHLORIDE 0.9 % IV SOLN
INTRAVENOUS | Status: DC
Start: 2024-04-01 — End: 2024-04-01

## 2024-04-01 NOTE — Anesthesia Postprocedure Evaluation (Signed)
 Anesthesia Post Note  Patient: Janet Austin  Procedure(s) Performed: COLONOSCOPY  Patient location during evaluation: Endoscopy Anesthesia Type: General Level of consciousness: awake and alert Pain management: pain level controlled Vital Signs Assessment: post-procedure vital signs reviewed and stable Respiratory status: spontaneous breathing, nonlabored ventilation and respiratory function stable Cardiovascular status: blood pressure returned to baseline and stable Postop Assessment: no apparent nausea or vomiting Anesthetic complications: no   No notable events documented.   Last Vitals:  Vitals:   04/01/24 0936 04/01/24 0946  BP: (!) 97/54 123/73  Pulse: 84 74  Resp: (!) 21 (!) 23  Temp:    SpO2: 97% 97%    Last Pain:  Vitals:   04/01/24 0946  TempSrc:   PainSc: 0-No pain                 Baltazar Bonier

## 2024-04-01 NOTE — Anesthesia Preprocedure Evaluation (Addendum)
 Anesthesia Evaluation  Patient identified by MRN, date of birth, ID band Patient awake    Reviewed: Allergy & Precautions, H&P , NPO status , Patient's Chart, lab work & pertinent test results  Airway Mallampati: II  TM Distance: >3 FB Neck ROM: full    Dental no notable dental hx.    Pulmonary asthma , sleep apnea    Pulmonary exam normal        Cardiovascular negative cardio ROS Normal cardiovascular exam     Neuro/Psych  PSYCHIATRIC DISORDERS Anxiety     negative neurological ROS     GI/Hepatic negative GI ROS, Neg liver ROS,,,  Endo/Other  diabetes    Renal/GU negative Renal ROS  negative genitourinary   Musculoskeletal  (+) Arthritis ,    Abdominal  (+) + obese  Peds  Hematology negative hematology ROS (+)   Anesthesia Other Findings Past Medical History: No date: Anxiety No date: Arthritis No date: Asthma     Comment:  [employer] 02/13/2021 No date: Cancer [facility])     Comment:  left breast No date: Diabetes mellitus without complication (HCC) No date: Headache     Comment:  migraines No date: Hyperlipidemia     Comment:  [employer] 02/13/2021 No date: PONV (postoperative nausea and vomiting) No date: Sleep apnea     Comment:  uses CPAP  Past Surgical History: No date: BILATERAL TOTAL MASTECTOMY WITH AXILLARY LYMPH NODE  DISSECTION     Comment:  double mastectomy No date: BREAST SURGERY     Comment:  reconstruction 05/29/2016: COLONOSCOPY WITH PROPOFOL; N/A     Comment:  Procedure: COLONOSCOPY WITH PROPOFOL;  Surgeon: Deveron Fly, MD;  Location: [facility] ENDOSCOPY;  Service:               Endoscopy;  Laterality: N/A; No date: KNEE ARTHROSCOPY; Right No date: MASTECTOMY No date: TOTAL ABDOMINAL HYSTERECTOMY     Reproductive/Obstetrics negative OB ROS                             Anesthesia Physical Anesthesia Plan  ASA: 2  Anesthesia Plan: General    Post-op Pain Management:    Induction:   PONV Risk Score and Plan: Propofol infusion and TIVA  Airway Management Planned:   Additional Equipment:   Intra-op Plan:   Post-operative Plan:   Informed Consent: I have reviewed the patients History and Physical, chart, labs and discussed the procedure including the risks, benefits and alternatives for the proposed anesthesia with the patient or authorized representative who has indicated his/her understanding and acceptance.     Dental Advisory Given  Plan Discussed with: CRNA and Surgeon  Anesthesia Plan Comments:         Anesthesia Quick Evaluation

## 2024-04-01 NOTE — Op Note (Signed)
 [facility] Gastroenterology Patient Name: Janet Austin Procedure Date: 04/01/2024 9:07 AM MRN: [id number] Account #: [id number] Date of Birth: 06-03-1961 Admit Type: Outpatient Age: 63 Room: [facility] ENDO ROOM 4 Gender: Female Note Status: Finalized Instrument Name: Prentice Docker [id number] Procedure:             Colonoscopy Indications:           Screening for colorectal malignant neoplasm, father                         with colon cancer Providers:             Midge Minium MD, MD Referring MD:          Bethanie Dicker (Referring MD) Medicines:             Propofol per Anesthesia Complications:         No immediate complications. Procedure:             Pre-Anesthesia Assessment:                        - Prior to the procedure, a History and Physical was                         performed, and patient medications and allergies were                         reviewed. The patient's tolerance of previous                         anesthesia was also reviewed. The risks and benefits                         of the procedure and the sedation options and risks                         were discussed with the patient. All questions were                         answered, and informed consent was obtained. Prior                         Anticoagulants: The patient has taken no anticoagulant                         or antiplatelet agents. ASA Grade Assessment: II - A                         patient with mild systemic disease. After reviewing                         the risks and benefits, the patient was deemed in                         satisfactory condition to undergo the procedure.                        After obtaining informed consent, the colonoscope was  passed under direct vision. Throughout the procedure,                         the patient's blood pressure, pulse, and oxygen                         saturations were monitored continuously. The                          Colonoscope was introduced through the anus and                         advanced to the the cecum, identified by appendiceal                         orifice and ileocecal valve. The colonoscopy was                         performed without difficulty. The patient tolerated                         the procedure well. The quality of the bowel                         preparation was excellent. Findings:      The perianal and digital rectal examinations were normal.      A 3 mm polyp was found in the transverse colon. The polyp was sessile.       The polyp was removed with a cold biopsy forceps. Resection and       retrieval were complete.      Non-bleeding internal hemorrhoids were found during retroflexion. The       hemorrhoids were Grade I (internal hemorrhoids that do not prolapse). Impression:            - One 3 mm polyp in the transverse colon, removed with                         a cold biopsy forceps. Resected and retrieved.                        - Non-bleeding internal hemorrhoids. Recommendation:        - Discharge patient to home.                        - Resume previous diet.                        - Continue present medications.                        - Await pathology results.                        - Repeat colonoscopy in 5 years for surveillance. Procedure Code(s):     --- Professional ---                        [phone], Colonoscopy, flexible; with biopsy, single or  multiple Diagnosis Code(s):     --- Professional ---                        Z12.11, Encounter for screening for malignant neoplasm                         of colon                        D12.3, Benign neoplasm of transverse colon (hepatic                         flexure or splenic flexure) CPT copyright 2022 American Medical Association. All rights reserved. The codes documented in this report are preliminary and upon coder review may  be revised to meet current compliance  requirements. Marnee Sink MD, MD 04/01/2024 9:36:52 AM This report has been signed electronically. Number of Addenda: 0 Note Initiated On: 04/01/2024 9:07 AM Scope Withdrawal Time: 0 hours 7 minutes 50 seconds  Total Procedure Duration: 0 hours 10 minutes 35 seconds  Estimated Blood Loss:  Estimated blood loss: none.      [facility]

## 2024-04-01 NOTE — H&P (Signed)
 Midge Minium, MD [facility] [address] [place], [place] [address] Phone: [phone] Fax : [phone]  Primary Care Physician:  Bethanie Dicker, NP Primary Gastroenterologist:  Dr. Servando Snare  Pre-Procedure History & Physical: HPI:  Janet Austin is a 63 y.o. female is here for a screening colonoscopy.   Past Medical History:  Diagnosis Date   Anxiety    Arthritis    Asthma    Phreesia 02/13/2021   Cancer (HCC)    left breast   Diabetes mellitus without complication (HCC)    Headache    migraines   Hyperlipidemia    Phreesia 02/13/2021   PONV (postoperative nausea and vomiting)    Sleep apnea    uses CPAP    Past Surgical History:  Procedure Laterality Date   BILATERAL TOTAL MASTECTOMY WITH AXILLARY LYMPH NODE DISSECTION     double mastectomy   BREAST SURGERY     reconstruction   COLONOSCOPY WITH PROPOFOL N/A 05/29/2016   Procedure: COLONOSCOPY WITH PROPOFOL;  Surgeon: Christena Deem, MD;  Location: [facility] ENDOSCOPY;  Service: Endoscopy;  Laterality: N/A;   KNEE ARTHROSCOPY Right    MASTECTOMY     TOTAL ABDOMINAL HYSTERECTOMY      Prior to Admission medications   Medication Sig Start Date End Date Taking? Authorizing Provider  albuterol (PROVENTIL) (2.5 MG/3ML) 0.083% nebulizer solution Take 3 mLs (2.5 mg total) by nebulization every 6 (six) hours as needed for wheezing or shortness of breath. 12/31/23  Yes Saralyn Pilar A, PA  albuterol (VENTOLIN HFA) 108 (90 Base) MCG/ACT inhaler Inhale 2 puffs into the lungs every 6 (six) hours as needed for wheezing or shortness of breath. 10/24/23  Yes Melida Quitter, PA  ALPRAZolam (XANAX) 0.5 MG tablet Take 1 tablet (0.5 mg total) by mouth daily as needed for anxiety. 03/17/24  Yes Bethanie Dicker, NP  atorvastatin (LIPITOR) 10 MG tablet Take 1 tablet (10 mg total) by mouth daily. 12/20/23  Yes Saralyn Pilar A, PA  budesonide-formoterol (SYMBICORT) 160-4.5 MCG/ACT inhaler Inhale 2 puffs into the lungs daily. 01/27/24  Yes  Saralyn Pilar A, PA  busPIRone (BUSPAR) 5 MG tablet TAKE 1 TABLET(5 MG) BY MOUTH THREE TIMES DAILY 01/29/24  Yes Saralyn Pilar A, PA  ipratropium (ATROVENT) 0.02 % nebulizer solution Take 2.5 mLs (0.5 mg total) by nebulization 4 (four) times daily as needed for wheezing or shortness of breath (cough). 02/12/24  Yes Salena Saner, MD  tirzepatide [facility]) 7.5 MG/0.5ML Pen Inject 7.5 mg into the skin once a week. 03/10/24  Yes Bethanie Dicker, NP  zolpidem (AMBIEN) 10 MG tablet TAKE ONE TABLET BY MOUTH ONE TIME DAILY AT BEDTIME AS NEEDED FOR SLEEP 12/03/23  Yes Saralyn Pilar A, PA    Allergies as of 02/20/2024 - Review Complete 02/20/2024  Allergen Reaction Noted   Other Itching and Rash 02/13/2021   Asa [aspirin]  05/28/2016   Gabapentin Other (See Comments) 05/28/2016   Lyrica [pregabalin] Other (See Comments) 05/28/2016   Tape Rash 11/18/2017    Family History  Problem Relation Age of Onset   Hypertension Father     Social History   Socioeconomic History   Marital status: Married    Spouse name: Not on file   Number of children: Not on file   Years of education: Not on file   Highest education level: Some college, no degree  Occupational History   Not on file  Tobacco Use   Smoking status: Never    Passive exposure: Never  Smokeless tobacco: Never  Vaping Use   Vaping status: Never Used  Substance and Sexual Activity   Alcohol use: No   Drug use: No   Sexual activity: Not on file  Other Topics Concern   Not on file  Social History Narrative   Not on file   Social Drivers of Health   Financial Resource Strain: Low Risk  (12/20/2023)   Overall Financial Resource Strain (CARDIA)    Difficulty of Paying Living Expenses: Not very hard  Food Insecurity: No Food Insecurity (12/20/2023)   Hunger Vital Sign    Worried About Running Out of Food in the Last Year: Never true    Ran Out of Food in the Last Year: Never true  Transportation Needs: No Transportation Needs  (12/20/2023)   PRAPARE - Administrator, Civil Service (Medical): No    Lack of Transportation (Non-Medical): No  Physical Activity: Inactive (12/20/2023)   Exercise Vital Sign    Days of Exercise per Week: 0 days    Minutes of Exercise per Session: 10 min  Stress: Stress Concern Present (12/20/2023)   [employer] of Occupational Health - Occupational Stress Questionnaire    Feeling of Stress : To some extent  Social Connections: Socially Integrated (12/20/2023)   Social Connection and Isolation Panel [NHANES]    Frequency of Communication with Friends and Family: More than three times a week    Frequency of Social Gatherings with Friends and Family: Patient declined    Attends Religious Services: More than 4 times per year    Active Member of [employer] or Organizations: Yes    Attends Banker Meetings: Patient declined    Marital Status: Married  Catering manager Violence: Not At Risk (06/13/2023)   Humiliation, Afraid, Rape, and Kick questionnaire    Fear of Current or Ex-Partner: No    Emotionally Abused: No    Physically Abused: No    Sexually Abused: No    Review of Systems: See HPI, otherwise negative ROS  Physical Exam: BP 126/83   Pulse 81   Temp (!) 96.6 F (35.9 C) (Temporal)   Resp 20   Ht 5\' 4"  (1.626 m)   Wt 90.8 kg   SpO2 98%   BMI 34.36 kg/m  General:   Alert,  pleasant and cooperative in NAD Head:  Normocephalic and atraumatic. Neck:  Supple; no masses or thyromegaly. Lungs:  Clear throughout to auscultation.    Heart:  Regular rate and rhythm. Abdomen:  Soft, nontender and nondistended. Normal bowel sounds, without guarding, and without rebound.   Neurologic:  Alert and  oriented x4;  grossly normal neurologically.  Impression/Plan: Janet Austin is now here to undergo a screening colonoscopy.  Risks, benefits, and alternatives regarding colonoscopy have been reviewed with the patient.  Questions have been answered.  All parties  agreeable.

## 2024-04-02 ENCOUNTER — Encounter: Payer: Self-pay | Admitting: Gastroenterology

## 2024-04-02 DIAGNOSIS — M1711 Unilateral primary osteoarthritis, right knee: Secondary | ICD-10-CM | POA: Diagnosis not present

## 2024-04-02 LAB — SURGICAL PATHOLOGY

## 2024-04-03 ENCOUNTER — Encounter: Payer: Self-pay | Admitting: Gastroenterology

## 2024-04-09 MED ORDER — TIRZEPATIDE 10 MG/0.5ML ~~LOC~~ SOAJ: 10.0000 mg | SUBCUTANEOUS | 0 refills | Status: DC

## 2024-04-13 DIAGNOSIS — K080 Exfoliation of teeth due to systemic causes: Secondary | ICD-10-CM | POA: Diagnosis not present

## 2024-04-22 DIAGNOSIS — J45998 Other asthma: Secondary | ICD-10-CM | POA: Diagnosis not present

## 2024-05-06 MED ORDER — TIRZEPATIDE 10 MG/0.5ML ~~LOC~~ SOAJ: 10.0000 mg | SUBCUTANEOUS | 0 refills | Status: DC

## 2024-05-06 NOTE — Addendum Note (Signed)
 Addended by: Jackline Maser on: 05/06/2024 02:29 PM   Modules accepted: Orders

## 2024-05-07 ENCOUNTER — Encounter: Payer: Self-pay | Admitting: Dermatology

## 2024-05-07 ENCOUNTER — Ambulatory Visit: Admitting: Dermatology

## 2024-05-07 VITALS — BP 139/96 | HR 101

## 2024-05-07 DIAGNOSIS — Z1283 Encounter for screening for malignant neoplasm of skin: Secondary | ICD-10-CM | POA: Diagnosis not present

## 2024-05-07 DIAGNOSIS — L814 Other melanin hyperpigmentation: Secondary | ICD-10-CM

## 2024-05-07 DIAGNOSIS — L738 Other specified follicular disorders: Secondary | ICD-10-CM

## 2024-05-07 DIAGNOSIS — D2339 Other benign neoplasm of skin of other parts of face: Secondary | ICD-10-CM | POA: Diagnosis not present

## 2024-05-07 DIAGNOSIS — L821 Other seborrheic keratosis: Secondary | ICD-10-CM

## 2024-05-07 DIAGNOSIS — D1801 Hemangioma of skin and subcutaneous tissue: Secondary | ICD-10-CM

## 2024-05-07 DIAGNOSIS — L650 Telogen effluvium: Secondary | ICD-10-CM

## 2024-05-07 DIAGNOSIS — L578 Other skin changes due to chronic exposure to nonionizing radiation: Secondary | ICD-10-CM | POA: Diagnosis not present

## 2024-05-07 DIAGNOSIS — W908XXA Exposure to other nonionizing radiation, initial encounter: Secondary | ICD-10-CM

## 2024-05-07 MED ORDER — MUPIROCIN 2 % EX OINT: 1.0000 | TOPICAL_OINTMENT | Freq: Three times a day (TID) | CUTANEOUS | 0 refills | Status: DC

## 2024-05-07 NOTE — Patient Instructions (Addendum)

## 2024-05-07 NOTE — Progress Notes (Signed)
 New Patient Visit   Subjective  Janet Austin is a 63 y.o. female who presents for the following: Skin Cancer Screening and Full Body Skin Exam. Previously had something biopsied but is unaware of the result. No family hx of skin cancer.  The patient presents for Total-Body Skin Exam (TBSE) for skin cancer screening and mole check. The patient has spots, moles and lesions to be evaluated, some may be new or changing.  The following portions of the chart were reviewed this encounter and updated as appropriate: medications, allergies, medical history  Review of Systems:  No other skin or systemic complaints except as noted in HPI or Assessment and Plan.  Objective  Well appearing patient in no apparent distress; mood and affect are within normal limits.  A full examination was performed including scalp, head, eyes, ears, nose, lips, neck, chest, axillae, abdomen, back, buttocks, bilateral upper extremities, bilateral lower extremities, hands, feet, fingers, toes, fingernails, and toenails. All findings within normal limits unless otherwise noted below.   Relevant physical exam findings are noted in the Assessment and Plan.    Assessment & Plan   SKIN CANCER SCREENING PERFORMED TODAY.  ACTINIC DAMAGE - Chronic condition, secondary to cumulative UV/sun exposure - diffuse scaly erythematous macules with underlying dyspigmentation - Recommend daily broad spectrum sunscreen SPF 30+ to sun-exposed areas, reapply every 2 hours as needed.  - Staying in the shade or wearing long sleeves, sun glasses (UVA+UVB protection) and wide brim hats (4-inch brim around the entire circumference of the hat) are also recommended for sun protection.  - Call for new or changing lesions.  LENTIGINES, SEBORRHEIC KERATOSES, HEMANGIOMAS - Benign normal skin lesions - Benign-appearing - Call for any changes  MELANOCYTIC NEVI - Tan-brown and/or pink-flesh-colored symmetric macules and papules - Benign  appearing on exam today - Observation - Call clinic for new or changing moles - Recommend daily use of broad spectrum spf 30+ sunscreen to sun-exposed areas.   Angiofibroma/Fibrous Papule - 1-2 mm smooth symmetric flesh colored to pink papule(s) without features suspicious for malignancy on dermoscopy - Benign-appearing.  Observation.  Call clinic for new or changing lesions.   Sebaceous Hyperplasia - Small yellow papules with a central dell - Benign-appearing - Observe. Call for changes.  TELOGEN EFFLUVIUM Exam: Diffuse thinning of hair, positive hair pull test.  Telogen effluvium is a benign, self-limited condition causing increased hair shedding usually for several months. It does not progress to baldness, and the hair eventually grows back on its own. It can be triggered by recent illness, recent surgery, thyroid  disease, low iron stores, vitamin D  deficiency, fad diets or rapid weight loss, hormonal changes such as pregnancy or birth control pills, and some medication. Usually the hair loss starts 2-3 months after the illness or health change. Rarely, it can continue for longer than a year. Treatments options may include oral or topical Minoxidil; Red Light scalp treatments; Biotin 2.5 mg daily and other options.  Treatment Plan: Start Topical Minoxidil Start oral supplements (Vivascal vs Nutrafol vs other)  FIBROUS PAPULE OF CHEEK Left Malar Cheek Observation, call if you notice any changes TELOGEN EFFLUVIUM (3) Mid Frontal Scalp (2), Right Frontal Scalp Start using OTC Rogaine 5% and viviscal  SEBACEOUS HYPERPLASIA Mid Forehead  Return in about 1 year (around 05/07/2025) for TBSC .  I, Haig Levan, Surg Tech III, am acting as scribe for Deneise Finlay, MD.   Documentation: I have reviewed the above documentation for accuracy and completeness, and I agree  with the above.  Deneise Finlay, MD

## 2024-05-13 DIAGNOSIS — H40003 Preglaucoma, unspecified, bilateral: Secondary | ICD-10-CM | POA: Diagnosis not present

## 2024-05-14 ENCOUNTER — Ambulatory Visit (INDEPENDENT_AMBULATORY_CARE_PROVIDER_SITE_OTHER): Payer: Medicare Other | Admitting: Nurse Practitioner

## 2024-05-14 ENCOUNTER — Encounter: Payer: Self-pay | Admitting: Nurse Practitioner

## 2024-05-14 VITALS — BP 118/68 | HR 70 | Temp 97.8°F | Ht 64.0 in | Wt 193.0 lb

## 2024-05-14 DIAGNOSIS — E559 Vitamin D deficiency, unspecified: Secondary | ICD-10-CM

## 2024-05-14 DIAGNOSIS — F411 Generalized anxiety disorder: Secondary | ICD-10-CM

## 2024-05-14 DIAGNOSIS — L658 Other specified nonscarring hair loss: Secondary | ICD-10-CM

## 2024-05-14 DIAGNOSIS — J4520 Mild intermittent asthma, uncomplicated: Secondary | ICD-10-CM

## 2024-05-14 DIAGNOSIS — T50905A Adverse effect of unspecified drugs, medicaments and biological substances, initial encounter: Secondary | ICD-10-CM

## 2024-05-14 DIAGNOSIS — E119 Type 2 diabetes mellitus without complications: Secondary | ICD-10-CM | POA: Diagnosis not present

## 2024-05-14 DIAGNOSIS — Z7985 Long-term (current) use of injectable non-insulin antidiabetic drugs: Secondary | ICD-10-CM

## 2024-05-14 NOTE — Progress Notes (Signed)
 Janet Burkitt, NP-C Phone: [phone]  Janet Austin is a 63 y.o. female who presents today for follow up.   Discussed the use of AI scribe software for clinical note transcription with the patient, who gave verbal consent to proceed.  History of Present Illness   Janet Austin is a 63 year old female who presents for a three-month follow-up and concerns about hair loss.  She has experienced significant hair loss since reaching a dose of 7.5 mg of Mounjaro , which she associates with the medication. Her hair was already thin following chemotherapy in 2010, but she was able to style it until recently. She has seen a dermatologist for a skin check and has an upcoming appointment with a hair loss specialist in October. She is comfortable with the hair loss, stating 'I've been bald before' and often wears a baseball hat.  She has been on Mounjaro  for about five to six months and has experienced a weight loss of thirteen pounds since February. However, she notes a recent weight fluctuation, with her weight increasing from 189.5 to 193 pounds. She attributes some of this to nausea, which has affected her water intake since May 13.  She underwent a colonoscopy recently, which revealed a precancerous polyp. She describes the preparation with ClinPak as much easier than previous experiences, despite the high cost and unpleasant taste.  She has a history of asthma and her symptoms are controlled with daily Symbicort  and albuterol  nebulizer treatments once or twice a day, particularly in the evenings when she feels tired and starts coughing. She notes improvement in her cough since her last visit in February.  Her diabetes is well-controlled with an A1c of 5.7 as of February, down from 6.7. She is currently on Mounjaro  10 mg weekly.   She experiences frequent urination, which she attributes to high water intake, although this has decreased recently due to nausea.  She has a history of anxiety, managed  with Buspar  and Xanax . She takes 2.5 mg of Buspar  in the morning and evening and is considering increasing the morning dose to 5 mg. She uses Xanax  as needed and has recently stopped taking Ambien .  Her vitamin D  levels have been low since chemotherapy, and she is currently taking 3000 units of vitamin D3 daily.      Social History   Tobacco Use  Smoking Status Never   Passive exposure: Never  Smokeless Tobacco Never    Current Outpatient Medications on File Prior to Visit  Medication Sig Dispense Refill   albuterol  (PROVENTIL ) (2.5 MG/3ML) 0.083% nebulizer solution Take 3 mLs (2.5 mg total) by nebulization every 6 (six) hours as needed for wheezing or shortness of breath. 75 mL 0   albuterol  (VENTOLIN  HFA) 108 (90 Base) MCG/ACT inhaler Inhale 2 puffs into the lungs every 6 (six) hours as needed for wheezing or shortness of breath. 54 g 3   ALPRAZolam  (XANAX ) 0.5 MG tablet Take 1 tablet (0.5 mg total) by mouth daily as needed for anxiety. 30 tablet 2   atorvastatin  (LIPITOR) 10 MG tablet TAKE ONE TABLET BY MOUTH ONE TIME DAILY 90 tablet 3   budesonide -formoterol  (SYMBICORT ) 160-4.5 MCG/ACT inhaler Inhale 2 puffs into the lungs daily. 10.2 g 1   busPIRone  (BUSPAR ) 5 MG tablet TAKE 1 TABLET(5 MG) BY MOUTH THREE TIMES DAILY 90 tablet 1   ipratropium (ATROVENT ) 0.02 % nebulizer solution Take 2.5 mLs (0.5 mg total) by nebulization 4 (four) times daily as needed for wheezing or shortness of breath (cough).  75 mL 6   mupirocin  ointment (BACTROBAN ) 2 % Apply 1 Application topically 3 (three) times daily. Apply 2x daily for 1 week 22 g 0   tirzepatide  (MOUNJARO ) 10 MG/0.5ML Pen Inject 10 mg into the skin once a week. 2 mL 0   No current facility-administered medications on file prior to visit.     ROS see history of present illness  Objective  Physical Exam Vitals:   05/14/24 0917  BP: 118/68  Pulse: 70  Temp: 97.8 F (36.6 C)  SpO2: 95%    BP Readings from Last 3 Encounters:   05/14/24 118/68  05/07/24 (!) 139/96  04/01/24 123/73   Wt Readings from Last 3 Encounters:  05/14/24 193 lb (87.5 kg)  04/01/24 200 lb 3.2 oz (90.8 kg)  02/14/24 206 lb 9.6 oz (93.7 kg)    Physical Exam Constitutional:      General: She is not in acute distress.    Appearance: Normal appearance.  HENT:     Head: Normocephalic.  Cardiovascular:     Rate and Rhythm: Normal rate and regular rhythm.     Heart sounds: Normal heart sounds.  Pulmonary:     Effort: Pulmonary effort is normal.     Breath sounds: Normal breath sounds.  Skin:    General: Skin is warm and dry.  Neurological:     General: No focal deficit present.     Mental Status: She is alert.  Psychiatric:        Mood and Affect: Mood normal.        Behavior: Behavior normal.      Assessment/Plan: Please see individual problem list.  Type 2 diabetes mellitus without complication, without long-term current use of insulin (HCC) Assessment & Plan: Diabetes is well-controlled with A1c reduced to 5.7 from 6.7, and she has lost 13 pounds since February. Mounjaro  is effective for diabetes and weight management. She is open to adjusting the dosage based on response and preference. Continue Mounjaro  10 mg until the current supply is finished. Re-evaluate dosage after the current supply and consider decreasing to 7.5 mg. Monitor weight and blood glucose levels.   Drug-related hair loss Assessment & Plan: Hair loss is likely secondary to increased dose of Mounjaro , which she accepts as a trade-off for weight management. The dermatologist recommended Nizoral shampoo, and she prefers to avoid oral medication for hair loss. Continue Mounjaro  as prescribed and use Nizoral shampoo. Follow up with the dermatologist in October.   Generalized anxiety disorder Assessment & Plan: Anxiety is managed with Buspar  and Xanax . She reports increased anxiety but is managing well with current medications. A Buspar  dosage adjustment was  discussed and agreed upon. Increase Buspar  to 5 mg in the morning and 2.5 mg in the evening. Continue Xanax  as needed. Consider a virtual visit for mood or anxiety management if needed.   Vitamin D  deficiency Assessment & Plan: Vitamin D  deficiency is likely related to past chemotherapy. The importance of vitamin D  for bone health, especially postmenopausal, was discussed. Continue vitamin D3 supplementation at 3000 IU daily and recheck vitamin D  levels in the future.    Mild intermittent asthma without complication Assessment & Plan: Asthma is well-controlled with the current regimen. She experiences an occasional cough when tired but no chronic cough. Breathing treatments and Symbicort  are effective. Continue Symbicort  once daily and use the albuterol  nebulizer as needed, typically once or twice daily.      Return in about 6 months (around 11/14/2024) for Follow up.  Janet Burkitt, NP-C [facility]

## 2024-05-19 DIAGNOSIS — H43813 Vitreous degeneration, bilateral: Secondary | ICD-10-CM | POA: Diagnosis not present

## 2024-05-19 DIAGNOSIS — M3501 Sicca syndrome with keratoconjunctivitis: Secondary | ICD-10-CM | POA: Diagnosis not present

## 2024-05-19 DIAGNOSIS — H40003 Preglaucoma, unspecified, bilateral: Secondary | ICD-10-CM | POA: Diagnosis not present

## 2024-05-19 DIAGNOSIS — E119 Type 2 diabetes mellitus without complications: Secondary | ICD-10-CM | POA: Diagnosis not present

## 2024-05-19 LAB — HM DIABETES EYE EXAM

## 2024-05-20 ENCOUNTER — Encounter: Payer: Self-pay | Admitting: Nurse Practitioner

## 2024-05-20 DIAGNOSIS — L658 Other specified nonscarring hair loss: Secondary | ICD-10-CM | POA: Insufficient documentation

## 2024-05-20 NOTE — Assessment & Plan Note (Signed)
 Vitamin D  deficiency is likely related to past chemotherapy. The importance of vitamin D  for bone health, especially postmenopausal, was discussed. Continue vitamin D3 supplementation at 3000 IU daily and recheck vitamin D  levels in the future.

## 2024-05-20 NOTE — Assessment & Plan Note (Signed)
 Asthma is well-controlled with the current regimen. She experiences an occasional cough when tired but no chronic cough. Breathing treatments and Symbicort  are effective. Continue Symbicort  once daily and use the albuterol  nebulizer as needed, typically once or twice daily.

## 2024-05-20 NOTE — Assessment & Plan Note (Signed)
 Hair loss is likely secondary to increased dose of Mounjaro , which she accepts as a trade-off for weight management. The dermatologist recommended Nizoral shampoo, and she prefers to avoid oral medication for hair loss. Continue Mounjaro  as prescribed and use Nizoral shampoo. Follow up with the dermatologist in October.

## 2024-05-20 NOTE — Assessment & Plan Note (Signed)
 Diabetes is well-controlled with A1c reduced to 5.7 from 6.7, and she has lost 13 pounds since February. Mounjaro  is effective for diabetes and weight management. She is open to adjusting the dosage based on response and preference. Continue Mounjaro  10 mg until the current supply is finished. Re-evaluate dosage after the current supply and consider decreasing to 7.5 mg. Monitor weight and blood glucose levels.

## 2024-05-20 NOTE — Assessment & Plan Note (Signed)
 Anxiety is managed with Buspar  and Xanax . She reports increased anxiety but is managing well with current medications. A Buspar  dosage adjustment was discussed and agreed upon. Increase Buspar  to 5 mg in the morning and 2.5 mg in the evening. Continue Xanax  as needed. Consider a virtual visit for mood or anxiety management if needed.

## 2024-05-23 DIAGNOSIS — J45998 Other asthma: Secondary | ICD-10-CM | POA: Diagnosis not present

## 2024-06-03 MED ORDER — TIRZEPATIDE 10 MG/0.5ML ~~LOC~~ SOAJ: 10.0000 mg | SUBCUTANEOUS | 0 refills | Status: DC

## 2024-06-03 NOTE — Addendum Note (Signed)
 Addended by: Jackline Maser on: 06/03/2024 01:09 PM   Modules accepted: Orders

## 2024-06-08 DIAGNOSIS — M069 Rheumatoid arthritis, unspecified: Secondary | ICD-10-CM | POA: Diagnosis not present

## 2024-06-09 ENCOUNTER — Other Ambulatory Visit: Payer: Self-pay | Admitting: Internal Medicine

## 2024-06-09 ENCOUNTER — Ambulatory Visit: Payer: Self-pay

## 2024-06-09 DIAGNOSIS — J4521 Mild intermittent asthma with (acute) exacerbation: Secondary | ICD-10-CM

## 2024-06-09 MED ORDER — ALBUTEROL SULFATE (2.5 MG/3ML) 0.083% IN NEBU: 2.5000 mg | INHALATION_SOLUTION | Freq: Four times a day (QID) | RESPIRATORY_TRACT | 11 refills | Status: AC | PRN

## 2024-06-09 MED ORDER — PREDNISONE 20 MG PO TABS: 20.0000 mg | ORAL_TABLET | Freq: Every day | ORAL | 1 refills | Status: DC

## 2024-06-09 MED ORDER — AZITHROMYCIN 250 MG PO TABS: ORAL_TABLET | ORAL | 0 refills | Status: DC

## 2024-06-09 NOTE — Telephone Encounter (Signed)
 FYI Only or Action Required?: Action required by provider: medication request.  Patient is followed in Pulmonology for asthma, last seen on 02/26/2024 by Isaiah Scrivener, MD. Called Nurse Triage reporting Cough and Sinus Problem. Symptoms began a week ago. Interventions attempted: OTC medications: chest congestion DM . Symptoms are: unchanged.  Triage Disposition: Call Specialist Now  Patient/caregiver understands and will follow disposition?: Yes       Reason for Triage: Experiencing congestion in head and chest, cough is getting worse.   Reason for Disposition  Earache  Answer Assessment - Initial Assessment Questions E2C2 Pulmonary Triage - Initial Assessment Questions Chief Complaint (e.g., cough, sob, wheezing, fever, chills, sweat or additional symptoms) *Go to specific symptom protocol after initial questions. Head, chest congestion and yellow productive cough  How long have symptoms been present? X9 days  Have you tested for COVID or Flu? Note: If not, ask patient if a home test can be taken. If so, instruct patient to call back for positive results. Yes, COVID/flu negative  MEDICINES:   Have you used any OTC meds to help with symptoms? Yes If yes, ask What medications? Advil for head congestion [facility] chest congestion DM - provided some relief  Have you used your inhalers/maintenance medication? Yes If yes, What medications? Atrovent  Symbicort  1 puff in AM Albuterol  NEB - last used last night (last dose) and provided more relief than INH Triager reviewed neb usage.  If inhaler, ask How many puffs and how often? Note: Review instructions on medication in the chart. See above  OXYGEN: Do you wear supplemental oxygen? No If yes, How many liters are you supposed to use? N/a  Do you monitor your oxygen levels? Yes If yes, What is your reading (oxygen level) today? 96  What is your usual oxygen saturation reading?  (Note: Pulmonary O2  sats should be 90% or greater) >96    1. LOCATION: Where does it hurt?      Head, chest, ears 2. ONSET: When did the sinus pain start?  (e.g., hours, days)      X 9 days 3. SEVERITY: How bad is the pain?   (Scale 1-10; mild, moderate or severe)   - MILD (1-3): doesn't interfere with normal activities    - MODERATE (4-7): interferes with normal activities (e.g., work or school) or awakens from sleep   - SEVERE (8-10): excruciating pain and patient unable to do any normal activities        Mild - worse with exertion 4. RECURRENT SYMPTOM: Have you ever had sinus problems before? If Yes, ask: When was the last time? and What happened that time?      Yes, February with duoneb and z-pack 5. NASAL CONGESTION: Is the nose blocked? If Yes, ask: Can you open it or must you breathe through your mouth?     yes 6. NASAL DISCHARGE: Do you have discharge from your nose? If so ask, What color?     Congested/running 7. FEVER: Do you have a fever? If Yes, ask: What is it, how was it measured, and when did it start?      Low grade 8. OTHER SYMPTOMS: Do you have any other symptoms? (e.g., sore throat, cough, earache, difficulty breathing)     Ear ache, cough 9. PREGNANCY: Is there any chance you are pregnant? When was your last menstrual period?     N/a    [facility]ager unable to schedule with [facility] d/t no access. Pt also reports PCP has no access. Pt endorses  similar sx like previous acute visit, and responded with z-pack and duoneb tx.  Protocols used: Sinus Pain or Congestion-A-AH

## 2024-06-09 NOTE — Addendum Note (Signed)
 Addended by: VICCI EVALENE DEL on: 06/09/2024 03:14 PM   Modules accepted: Orders

## 2024-06-09 NOTE — Telephone Encounter (Signed)
 I have notified the patient. She also ask for a refill on Proventil .   Per Dr. Isaiah- ok to fill.  I have sent in the prescription and the patient is aware.  Nothing further needed.

## 2024-06-09 NOTE — Progress Notes (Signed)
 Asthma exacerbation Z-Pak and prednisone  ordered

## 2024-06-11 ENCOUNTER — Ambulatory Visit

## 2024-06-22 DIAGNOSIS — J45998 Other asthma: Secondary | ICD-10-CM | POA: Diagnosis not present

## 2024-07-03 MED ORDER — TIRZEPATIDE 7.5 MG/0.5ML ~~LOC~~ SOAJ: 7.5000 mg | SUBCUTANEOUS | 3 refills | Status: DC

## 2024-07-03 NOTE — Addendum Note (Signed)
 Addended by: ORLANDO KINGDOM on: 07/03/2024 08:26 AM   Modules accepted: Orders

## 2024-07-20 ENCOUNTER — Other Ambulatory Visit: Payer: Self-pay | Admitting: Family Medicine

## 2024-07-20 DIAGNOSIS — J4540 Moderate persistent asthma, uncomplicated: Secondary | ICD-10-CM

## 2024-07-23 DIAGNOSIS — K080 Exfoliation of teeth due to systemic causes: Secondary | ICD-10-CM | POA: Diagnosis not present

## 2024-07-23 DIAGNOSIS — J45998 Other asthma: Secondary | ICD-10-CM | POA: Diagnosis not present

## 2024-08-04 ENCOUNTER — Other Ambulatory Visit (HOSPITAL_COMMUNITY): Payer: Self-pay

## 2024-08-04 ENCOUNTER — Telehealth: Payer: Self-pay

## 2024-08-04 NOTE — Telephone Encounter (Signed)
 Pharmacy Patient Advocate Encounter   Received notification from Patient Advice Request messages that prior authorization for MOUNJARO  is required/requested.   Insurance verification completed.   The patient is insured through [facility] .   Per test claim: Refill too soon. PA is not needed at this time. Medication was filled 07/30/24. Next eligible fill date is 08/24/24.

## 2024-08-13 ENCOUNTER — Ambulatory Visit: Payer: Self-pay

## 2024-08-13 NOTE — Telephone Encounter (Signed)
 Noted

## 2024-08-13 NOTE — Telephone Encounter (Signed)
 FYI Only or Action Required?: Action required by provider: request for appointment.  Patient was last seen in primary care on 05/14/2024 by Gretel App, NP.  Called Nurse Triage reporting Cough.  Symptoms began yesterday.  Interventions attempted: Rest, hydration, or home remedies.  Symptoms are: unchanged.Husband positive for COVID today. Pt. Has symptoms. No availability in practice, will go to UC.  Triage Disposition: Call PCP Within 24 Hours  Patient/caregiver understands and will follow disposition?: Yes   Copied from CRM #[id number]. Topic: Clinical - Red Word Triage >> Aug 13, 2024  3:23 PM Nathanel DEL wrote: Red Word that prompted transfer to Nurse Triage: dody aces, 101 tenp,husbandt tested postlvede fir covis Reason for Disposition  [1] HIGH RISK patient AND [2] influenza exposure within the last 7 days AND [3] ONE OR MORE respiratory symptoms: cough, sore throat, runny or stuffy nose  Answer Assessment - Initial Assessment Questions 1. COVID-19 EXPOSURE: Please describe how you were exposed to someone with a COVID-19 infection.     husband 2. PLACE of CONTACT: Where were you when you were exposed to COVID-19? (e.g., home, school, medical waiting room; which city?)     Home 3. TYPE of CONTACT: How much contact was there? (e.g., sitting next to, live in same house, work in same office, same building)     Lives with member 4. DURATION of CONTACT: How long were you in contact with the COVID-19 patient? (e.g., a few seconds, passed by person, a few minutes, 15 minutes or longer, live with the patient)     Lives  5. DATE of CONTACT: When did you have contact with a COVID-19 patient? (e.g., hours, days ago)     This week 6. MASK: Were you wearing a mask? Was the other person wearing a mask? Note: wearing a mask reduces the risk of an otherwise close contact.     no 7. SYMPTOMS: Do you have any symptoms? (e.g., fever, cough, breathing difficulty, loss of taste or  smell)     Cough, fever,body aches,fatigue 8. COVID-19 VACCINE: Have you had the COVID-19 vaccine? If Yes, ask: When did you last get it?     N/a 9. PREGNANCY OR POSTPARTUM: Is there any chance you are pregnant? When was your last menstrual period? Did you deliver in the last 2 weeks?     no 10. HIGH RISK: Do you have any heart or lung problems? (e.g., asthma, COPD, heart failure) Do you have a weak immune system or other risk factors? (e.g., HIV positive, chemotherapy, renal failure, diabetes mellitus, sickle cell anemia, obesity)       Asthma, diabetes  Answer Assessment - Initial Assessment Questions 1. SYMPTOMS: What is your main symptom or concern? (e.g., cough, fever, shortness of breath, muscle aches)     Cough, body aches, fatigue 2. ONSET: When did the symptoms start?      This week 3. COUGH: Do you have a cough? If Yes, ask: How bad is the cough?       yes 4. FEVER: Do you have a fever? If Yes, ask: What is your temperature, how was it measured, and when did it start?     101 5. BREATHING DIFFICULTY: Are you having any difficulty breathing? (e.g., normal; shortness of breath, wheezing, unable to speak)      no 6. BETTER-SAME-WORSE: Are you getting better, staying the same or getting worse compared to yesterday?  If getting worse, ask, In what way?     same 7. OTHER SYMPTOMS: Do  you have any other symptoms?  (e.g., chills, fatigue, headache, loss of smell or taste, muscle pain, sore throat)     no 8. COVID-19 DIAGNOSIS: How do you know that you have COVID? (e.g., positive lab test or self-test, diagnosed by doctor or NP/PA, symptoms after exposure).     N/a 9. COVID-19 EXPOSURE: Was there any known exposure to COVID before the symptoms began?      yes 10. COVID-19 VACCINE: Have you had the COVID-19 vaccine? If Yes, ask: When did you last get it?       N/a 11. HIGH RISK DISEASE: Do you have any chronic medical problems? (e.g., asthma,  heart or lung disease, weak immune system, obesity, etc.)       Asthma, diabetes 12. PREGNANCY: Is there any chance you are pregnant? When was your last menstrual period?       no 13. O2 SATURATION MONITOR:  Do you use an oxygen saturation monitor (pulse oximeter) at home? If Yes, ask What is your reading (oxygen level) today? What is your usual oxygen saturation reading? (e.g., 95%)       no  Protocols used: COVID-19 - Exposure-A-AH, COVID-19 - Diagnosed or Suspected-A-AH

## 2024-08-13 NOTE — Telephone Encounter (Signed)
 FYI Only or Action Required?: FYI only for provider.  Patient was last seen in primary care on 05/14/2024 by Gretel App, NP.  Called Nurse Triage reporting Fever.  Symptoms began yesterday.  Interventions attempted: OTC medications: Tylenol, Prescription medications: Albuterol  Nebulizer, and Rest, hydration, or home remedies.  Symptoms are: stable.  Triage Disposition: See PCP When Office is Open (Within 3 Days)  Patient/caregiver understands and will follow disposition?: Yes      Copied from CRM [id number]. Topic: Clinical - Red Word Triage >> Aug 13, 2024  2:33 PM Gennette ORN wrote: Red Word that prompted transfer to Nurse Triage: She is having head chest pain and dry cough a lot of congestion. She has a fever off and on of 101. She is aching really bad. Reason for Disposition  [1] Fever comes and goes (intermittent) AND [2] lasts > 3 weeks    Husband tested positive for Covid, Fever of 100 since yesterday.  Answer Assessment - Initial Assessment Questions Husband tested positive for Covid today at [facility], took an old home test this morning and was negative. Taken Tylenol, Albuterol  Nebulizer. No available appointments with any provider in PCP office. Patient did not want to be seen at another [place] office. Advised patient to UC. Patient upset this is the second time she has not been able to get a sick visit in her PCP office. If there is any cancelations in the next day or 2 she would like to be scheduled in.    1. TEMPERATURE: What is the most recent temperature?  How was it measured?      Noon, 100. Highest was 101  2. ONSET: When did the fever start?      Yesterday morning, 101.  3. CHILLS: Do you have chills? If yes: How bad are they?  (e.g., none, mild, moderate, severe)     No, feeling terrible hot  4. OTHER SYMPTOMS: Do you have any other symptoms besides the fever?  (e.g., abdomen pain, cough, diarrhea, earache, headache, sore throat, urination pain)      Dry cough at first, coughing a few times, productive, appears clear. R earache, whole body aches.   6. CONTACTS: Does anyone else in the family have an infection?     Husband tested positive Covid today  7. TREATMENT: What have you done so far to treat this fever? (e.g., OTC fever medicines)     Tylenol  8. IMMUNOCOMPROMISE: Do you have any of the following: diabetes, HIV positive, splenectomy, cancer chemotherapy, chronic steroid treatment, transplant patient, etc.?     Diabetic, slight asthma.  Protocols used: [facility]

## 2024-08-14 ENCOUNTER — Telehealth: Payer: Self-pay

## 2024-08-14 ENCOUNTER — Ambulatory Visit
Admission: RE | Admit: 2024-08-14 | Discharge: 2024-08-14 | Disposition: A | Discharge status: Home / Self Care | Source: Ambulatory Visit | Attending: Emergency Medicine | Admitting: Emergency Medicine

## 2024-08-14 VITALS — BP 113/79 | HR 87 | Temp 98.8°F | Resp 18

## 2024-08-14 DIAGNOSIS — B349 Viral infection, unspecified: Secondary | ICD-10-CM | POA: Diagnosis not present

## 2024-08-14 DIAGNOSIS — U071 COVID-19: Secondary | ICD-10-CM | POA: Diagnosis not present

## 2024-08-14 DIAGNOSIS — J4540 Moderate persistent asthma, uncomplicated: Secondary | ICD-10-CM

## 2024-08-14 HISTORY — DX: COVID-19: U07.1

## 2024-08-14 LAB — POC SOFIA SARS ANTIGEN FIA: SARS Coronavirus 2 Ag: POSITIVE — AB

## 2024-08-14 MED ORDER — PREDNISONE 10 MG (21) PO TBPK: ORAL_TABLET | Freq: Every day | ORAL | 0 refills | Status: DC

## 2024-08-14 MED ORDER — PAXLOVID (300/100) 20 X 150 MG & 10 X 100MG PO TBPK: 3.0000 | ORAL_TABLET | Freq: Two times a day (BID) | ORAL | 0 refills | Status: AC

## 2024-08-14 MED ORDER — BENZONATATE 100 MG PO CAPS: 100.0000 mg | ORAL_CAPSULE | Freq: Three times a day (TID) | ORAL | 0 refills | Status: DC

## 2024-08-14 MED ORDER — BUDESONIDE-FORMOTEROL FUMARATE 160-4.5 MCG/ACT IN AERO: 2.0000 | INHALATION_SPRAY | Freq: Every day | RESPIRATORY_TRACT | 1 refills | Status: DC

## 2024-08-14 NOTE — ED Provider Notes (Signed)
 [facility]    CSN: [id number] Arrival date & time: 08/14/24  1131      History   Chief Complaint Chief Complaint  Patient presents with   Cough    Cough/nasal congestion/body aches/fever up to 101/husband just tested positive for Covid at urgent care - Entered by patient   Nasal Congestion   Generalized Body Aches    HPI Janet Austin is a 63 y.o. female.   Patient presents for evaluation of a fever peaking at 101, nasal congestion, productive cough, right-sided ear pain and fullness, sinus pressure to the center of the face and intermittent wheezing with coughing present for 1 day.  Known sick contact in household with exposure to COVID-19.  Has attempted use of Tylenol and saline nasal spray with, albuterol  inhaler and nebulizer.  History of asthma, denies shortness of breath.  Tolerating food and liquids.  Past Medical History:  Diagnosis Date   Anxiety    Arthritis    Asthma    [employer] 02/13/2021   Cancer (HCC)    left breast   Diabetes mellitus without complication (HCC)    Headache    migraines   Hyperlipidemia    [employer] 02/13/2021   PONV (postoperative nausea and vomiting)    Sleep apnea    uses CPAP    Patient Active Problem List   Diagnosis Date Noted   Drug-related hair loss 05/20/2024   Encounter for screening colonoscopy 04/01/2024   Polyp of transverse colon 04/01/2024   Vitamin D  deficiency 06/16/2023   Type 2 diabetes mellitus (HCC) 06/16/2023   BMI 40.0-44.9, adult (HCC) 01/31/2022   Elevated blood pressure reading without diagnosis of hypertension 03/27/2021   Eczema 02/14/2021   History of breast cancer in female 11/09/2020   Daytime hypersomnolence 08/24/2020   Other fatigue 02/12/2020   Seasonal allergic rhinitis due to pollen 08/03/2019   Mild intermittent asthma without complication 08/22/2018   Hyperlipidemia associated with type 2 diabetes mellitus (HCC) 07/27/2018   Generalized anxiety disorder 03/11/2018   Primary  insomnia 03/11/2018    Past Surgical History:  Procedure Laterality Date   BILATERAL TOTAL MASTECTOMY WITH AXILLARY LYMPH NODE DISSECTION     double mastectomy   BREAST SURGERY     reconstruction   COLONOSCOPY N/A 04/01/2024   Procedure: COLONOSCOPY;  Surgeon: Jinny Carmine, MD;  Location[facility] ENDOSCOPY;  Service: Endoscopy;  Laterality: N/A;   COLONOSCOPY WITH PROPOFOL  N/A 05/29/2016   Procedure: COLONOSCOPY WITH PROPOFOL ;  Surgeon: Gladis RAYMOND Mariner, MD;  Location: [facility] ENDOSCOPY;  Service: Endoscopy;  Laterality: N/A;   KNEE ARTHROSCOPY Right    MASTECTOMY     TOTAL ABDOMINAL HYSTERECTOMY      OB History   No obstetric history on file.      Home Medications    Prior to Admission medications   Medication Sig Start Date End Date Taking? Authorizing Provider  benzonatate  (TESSALON ) 100 MG capsule Take 1 capsule (100 mg total) by mouth every 8 (eight) hours. 08/14/24  Yes Ernie Kasler R, NP  nirmatrelvir/ritonavir (PAXLOVID , 300/100,) 20 x 150 MG & 10 x 100MG  TBPK Take 3 tablets by mouth 2 (two) times daily for 5 days. Patient GFR is 94. Take nirmatrelvir (150 mg) two tablets twice daily for 5 days and ritonavir (100 mg) one tablet twice daily for 5 days. 08/14/24 08/19/24 Yes Aston Lawhorn R, NP  predniSONE  (STERAPRED UNI-PAK 21 TAB) 10 MG (21) TBPK tablet Take by mouth daily. Take 6 tabs by mouth daily  for  1 days, then 5 tabs for 1 days, then 4 tabs for 1 days, then 3 tabs for 1 days, 2 tabs for 1 days, then 1 tab by mouth daily for 1 days 08/14/24  Yes Lawton Dollinger, Shelba SAUNDERS, NP  albuterol  (PROVENTIL ) (2.5 MG/3ML) 0.083% nebulizer solution Take 3 mLs (2.5 mg total) by nebulization every 6 (six) hours as needed for wheezing or shortness of breath. 06/09/24   Isaiah Scrivener, MD  albuterol  (VENTOLIN  HFA) 108 (90 Base) MCG/ACT inhaler Inhale 2 puffs into the lungs every 6 (six) hours as needed for wheezing or shortness of breath. 10/24/23   Wallace Joesph LABOR, PA  ALPRAZolam  (XANAX ) 0.5 MG  tablet Take 1 tablet (0.5 mg total) by mouth daily as needed for anxiety. 03/17/24   Gretel App, NP  atorvastatin  (LIPITOR) 10 MG tablet TAKE ONE TABLET BY MOUTH ONE TIME DAILY 04/02/24   Gretel App, NP  azithromycin  (ZITHROMAX  Z-PAK) 250 MG tablet Take 2 tablets on Day 1 and then 1 tablet daily till gone. 06/09/24   Kasa, Kurian, MD  budesonide -formoterol  (SYMBICORT ) 160-4.5 MCG/ACT inhaler Inhale 2 puffs into the lungs daily. 08/14/24   Anagha Loseke, Shelba SAUNDERS, NP  busPIRone  (BUSPAR ) 5 MG tablet TAKE 1 TABLET(5 MG) BY MOUTH THREE TIMES DAILY 01/29/24   Wallace Joesph A, PA  ipratropium (ATROVENT ) 0.02 % nebulizer solution Take 2.5 mLs (0.5 mg total) by nebulization 4 (four) times daily as needed for wheezing or shortness of breath (cough). 02/12/24   Tamea Dedra CROME, MD  mupirocin  ointment (BACTROBAN ) 2 % Apply 1 Application topically 3 (three) times daily. Apply 2x daily for 1 week 05/07/24   Paci, Karina M, MD  tirzepatide  (MOUNJARO ) 7.5 MG/0.5ML Pen Inject 7.5 mg into the skin once a week. 07/03/24   Gretel App, NP    Family History Family History  Problem Relation Age of Onset   Hypertension Father     Social History Social History   Tobacco Use   Smoking status: Never    Passive exposure: Never   Smokeless tobacco: Never  Vaping Use   Vaping status: Never Used  Substance Use Topics   Alcohol use: No   Drug use: No     Allergies   Other, Aspirin, Gabapentin, Pregabalin, and Tape   Review of Systems Review of Systems   Physical Exam Triage Vital Signs ED Triage Vitals  Encounter Vitals Group     BP 08/14/24 1142 113/79     Girls Systolic BP Percentile --      Girls Diastolic BP Percentile --      Boys Systolic BP Percentile --      Boys Diastolic BP Percentile --      Pulse Rate 08/14/24 1142 87     Resp 08/14/24 1142 18     Temp 08/14/24 1142 98.8 F (37.1 C)     Temp Source 08/14/24 1142 Oral     SpO2 08/14/24 1142 95 %     Weight --      Height --      Head  Circumference --      Peak Flow --      Pain Score 08/14/24 1139 3     Pain Loc --      Pain Education --      Exclude from Growth Chart --    No data found.  Updated Vital Signs BP 113/79 (BP Location: Right Arm)   Pulse 87   Temp 98.8 F (37.1 C) (Oral)   Resp 18  SpO2 95%   Visual Acuity Right Eye Distance:   Left Eye Distance:   Bilateral Distance:    Right Eye Near:   Left Eye Near:    Bilateral Near:     Physical Exam Constitutional:      Appearance: Normal appearance.  HENT:     Head: Normocephalic.     Right Ear: Tympanic membrane, ear canal and external ear normal.     Left Ear: Tympanic membrane, ear canal and external ear normal.     Nose: Congestion present.  Eyes:     Extraocular Movements: Extraocular movements intact.  Cardiovascular:     Rate and Rhythm: Normal rate and regular rhythm.     Pulses: Normal pulses.     Heart sounds: Normal heart sounds.  Pulmonary:     Effort: Pulmonary effort is normal.     Breath sounds: Normal breath sounds.  Musculoskeletal:     Cervical back: Normal range of motion and neck supple.  Neurological:     Mental Status: She is alert and oriented to person, place, and time. Mental status is at baseline.      UC Treatments / Results  Labs (all labs ordered are listed, but only abnormal results are displayed) Labs Reviewed  POC SOFIA SARS ANTIGEN FIA - Abnormal; Notable for the following components:      Result Value   SARS Coronavirus 2 Ag Positive (*)    All other components within normal limits    EKG   Radiology No results found.  Procedures Procedures (including critical care time)  Medications Ordered in UC Medications - No data to display  Initial Impression / Assessment and Plan / UC Course  I have reviewed the triage vital signs and the nursing notes.  Pertinent labs & imaging results that were available during my care of the patient were reviewed by me and considered in my medical  decision making (see chart for details).  COVID-19, illness  Patient is in no signs of distress nor toxic appearing.  Vital signs are stable.  Low suspicion for pneumonia, pneumothorax or bronchitis and therefore will defer imaging.  Discussed quarantine per the CDC.  Prescribed Paxlovid  advise discontinuation of atorvastatin  during treatment.  Prescribed prednisone  Tessalon  and refill Symbicort  inhaler. May use additional over-the-counter medications as needed for supportive care.  May follow-up with urgent care as needed if symptoms persist or worsen.   Final Clinical Impressions(s) / UC Diagnoses   Final diagnoses:  Viral illness  COVID-19     Discharge Instructions      Covid 19 is a virus and should steadily improve in time it can take up to 7 to 10 days before you truly start to see a turnaround however things will get better    Per the CDC you will need to quarantine and to your 24 hours without fever, if no fever may continue activity wearing mask  Begin Paxlovid  twice daily for 5 days to reduce the amount of virus within the body that helps to minimize symptoms but does not fully take away the illness, you have until Tuesday, September 2 to start medicines, stop use of cholesterol medicine for 10 days from start of Paxlovid   Begin prednisone  every morning with food to open and relax the airway, avoid ibuprofen while taking this medicine  You may use Tessalon  Perles every 8 hours to help calm cough  Symbicort  inhaler has been refilled    You can take Tylenol and/or Ibuprofen as needed for fever  reduction and pain relief.   For cough: honey 1/2 to 1 teaspoon (you can dilute the honey in water or another fluid).  You can also use guaifenesin and dextromethorphan for cough. You can use a humidifier for chest congestion and cough.  If you don't have a humidifier, you can sit in the bathroom with the hot shower running.      For sore throat: try warm salt water gargles, cepacol  lozenges, throat spray, warm tea or water with lemon/honey, popsicles or ice, or OTC cold relief medicine for throat discomfort.   For congestion: take a daily anti-histamine like Zyrtec, Claritin, and a oral decongestant, such as pseudoephedrine.  You can also use Flonase 1-2 sprays in each nostril daily.   It is important to stay hydrated: drink plenty of fluids (water, gatorade/powerade/pedialyte, juices, or teas) to keep your throat moisturized and help further relieve irritation/discomfort.    ED Prescriptions     Medication Sig Dispense Auth. Provider   budesonide -formoterol  (SYMBICORT ) 160-4.5 MCG/ACT inhaler Inhale 2 puffs into the lungs daily. 10.2 g Ruthy Forry R, NP   benzonatate  (TESSALON ) 100 MG capsule Take 1 capsule (100 mg total) by mouth every 8 (eight) hours. 21 capsule Glendell Schlottman R, NP   nirmatrelvir/ritonavir (PAXLOVID , 300/100,) 20 x 150 MG & 10 x 100MG  TBPK Take 3 tablets by mouth 2 (two) times daily for 5 days. Patient GFR is 94. Take nirmatrelvir (150 mg) two tablets twice daily for 5 days and ritonavir (100 mg) one tablet twice daily for 5 days. 30 tablet Alicia Seib R, NP   predniSONE  (STERAPRED UNI-PAK 21 TAB) 10 MG (21) TBPK tablet Take by mouth daily. Take 6 tabs by mouth daily  for 1 days, then 5 tabs for 1 days, then 4 tabs for 1 days, then 3 tabs for 1 days, 2 tabs for 1 days, then 1 tab by mouth daily for 1 days 21 tablet Felica Chargois, Shelba SAUNDERS, NP      PDMP not reviewed this encounter.   Teresa Shelba SAUNDERS, NP 08/14/24 1242

## 2024-08-14 NOTE — ED Triage Notes (Signed)
 Patient complains of body aches, fever and nasal chest that started yesterday. Patient exposed to her husband which tested positive for COVID yesterday. Rates pain 3/10. Patient took Tylenol for symptoms last dose at 6 am.

## 2024-08-14 NOTE — Discharge Instructions (Addendum)
 Covid 19 is a virus and should steadily improve in time it can take up to 7 to 10 days before you truly start to see a turnaround however things will get better    Per the CDC you will need to quarantine and to your 24 hours without fever, if no fever may continue activity wearing mask  Begin Paxlovid  twice daily for 5 days to reduce the amount of virus within the body that helps to minimize symptoms but does not fully take away the illness, you have until Tuesday, September 2 to start medicines, stop use of cholesterol medicine for 10 days from start of Paxlovid   Begin prednisone  every morning with food to open and relax the airway, avoid ibuprofen while taking this medicine  You may use Tessalon  Perles every 8 hours to help calm cough  Symbicort  inhaler has been refilled    You can take Tylenol and/or Ibuprofen as needed for fever reduction and pain relief.   For cough: honey 1/2 to 1 teaspoon (you can dilute the honey in water or another fluid).  You can also use guaifenesin and dextromethorphan for cough. You can use a humidifier for chest congestion and cough.  If you don't have a humidifier, you can sit in the bathroom with the hot shower running.      For sore throat: try warm salt water gargles, cepacol lozenges, throat spray, warm tea or water with lemon/honey, popsicles or ice, or OTC cold relief medicine for throat discomfort.   For congestion: take a daily anti-histamine like Zyrtec, Claritin, and a oral decongestant, such as pseudoephedrine.  You can also use Flonase 1-2 sprays in each nostril daily.   It is important to stay hydrated: drink plenty of fluids (water, gatorade/powerade/pedialyte, juices, or teas) to keep your throat moisturized and help further relieve irritation/discomfort.

## 2024-08-14 NOTE — Telephone Encounter (Signed)
 2nd to nature forms have been placed in provider to be signed folder

## 2024-08-19 ENCOUNTER — Telehealth: Admitting: Nurse Practitioner

## 2024-08-19 NOTE — Telephone Encounter (Signed)
 Form faxed to [phone] with a completed transmission log

## 2024-08-23 DIAGNOSIS — J45998 Other asthma: Secondary | ICD-10-CM | POA: Diagnosis not present

## 2024-08-28 ENCOUNTER — Ambulatory Visit: Admitting: Internal Medicine

## 2024-08-28 ENCOUNTER — Encounter: Payer: Self-pay | Admitting: Internal Medicine

## 2024-08-28 VITALS — BP 120/80 | HR 83 | Temp 98.5°F | Ht 64.0 in | Wt 186.8 lb

## 2024-08-28 DIAGNOSIS — G4733 Obstructive sleep apnea (adult) (pediatric): Secondary | ICD-10-CM | POA: Diagnosis not present

## 2024-08-28 DIAGNOSIS — J4540 Moderate persistent asthma, uncomplicated: Secondary | ICD-10-CM

## 2024-08-28 DIAGNOSIS — E669 Obesity, unspecified: Secondary | ICD-10-CM

## 2024-08-28 DIAGNOSIS — Z6832 Body mass index (BMI) 32.0-32.9, adult: Secondary | ICD-10-CM

## 2024-08-28 DIAGNOSIS — U071 COVID-19: Secondary | ICD-10-CM

## 2024-08-28 DIAGNOSIS — R5381 Other malaise: Secondary | ICD-10-CM

## 2024-08-28 MED ORDER — AZITHROMYCIN 250 MG PO TABS: ORAL_TABLET | ORAL | 0 refills | Status: DC

## 2024-08-28 MED ORDER — ALBUTEROL SULFATE 1.25 MG/3ML IN NEBU: 1.0000 | INHALATION_SOLUTION | Freq: Four times a day (QID) | RESPIRATORY_TRACT | 12 refills | Status: AC | PRN

## 2024-08-28 MED ORDER — PREDNISONE 20 MG PO TABS: 20.0000 mg | ORAL_TABLET | Freq: Every day | ORAL | 1 refills | Status: DC

## 2024-08-28 NOTE — Progress Notes (Signed)
 Name: Janet Austin MRN: [id number] DOB: 05-Nov-1961     SYNOPSIS 63 year old female never smoker followed for mild intermittent asthma, pulmonary nodules (serial follow up , resolved on CT chest 12/2016)  Medical history significant for breast cancer 2010 s/p bilateral mastectomy ,and chemo .      TEST/EVENTS :  Pulmonary function testing done July 2018 showed no airflow obstruction with FEV1 at 81%, ratio 86, FVC 77% no significant bronchodilator response, DLCO 52%, mid flow obstruction and reversibility noted  CXR 10/2016 clear lungs  diagnosed with obstructive sleep apnea Sleep study documents and reports reviewed in detail with the patient She snored 415 times AHI was 20 She desatted 134 times Lowest O2 sat was 75%    CC Follow-up assessment for asthma Follow-up assessment for OSA     HPI Asthma exacerbation due to COVID infection Tested +2 weeks ago Sick contact husband with COVID Cough congestion wheezing Plan for round of antibiotics and prednisone  No indication for hospitalization at this time  No evidence of heart failure at this time No evidence or signs of infection at this time No respiratory distress No fevers, chills, nausea, vomiting, diarrhea No evidence of lower extremity edema No evidence hemoptysis    FOLLOW UP OSA Encouraged proper weight management.  Discussed driving precautions and its relationship with hypersomnolence.  Discussed sleep hygiene, and benefits of a fixed sleep waked time.  The importance of getting eight or more hours of sleep discussed with patient.  Discussed limiting the use of the computer and television before bedtime.  Decrease naps during the day, so night time sleep will become enhanced.  Limit caffeine, and sleep deprivation.   Patient uses and benefits from therapy Using CPAP nightly and with naps Pressure setting is comfortable and is sleeping well.  Excellent compliance report reviewed today in detail with  patient 100% for days 100% for greater than 4 hours AHI reduced to 0.4 Auto CPAP 5-12     PAST MEDICAL HISTORY :   has a past medical history of Anxiety, Arthritis, Asthma, Cancer (HCC), COVID (08/14/2024), Diabetes mellitus without complication (HCC), Headache, Hyperlipidemia, PONV (postoperative nausea and vomiting), and Sleep apnea.  has a past surgical history that includes Total abdominal hysterectomy; Bilateral total mastectomy with axillary lymph node dissection; Breast surgery; Mastectomy; Knee arthroscopy (Right); Colonoscopy with propofol  (N/A, 05/29/2016); and Colonoscopy (N/A, 04/01/2024). Prior to Admission medications   Medication Sig Start Date End Date Taking? Authorizing Provider  albuterol  (VENTOLIN  HFA) 108 (90 Base) MCG/ACT inhaler Inhale 2 puffs into the lungs every 6 (six) hours as needed for wheezing or shortness of breath. 02/12/20  Yes Boscia, Heather E, NP  ALPRAZolam  (XANAX ) 1 MG tablet Take 1 tablet (1 mg total) by mouth at bedtime as needed for anxiety. 11/09/20  Yes Boscia, Heather E, NP  atorvastatin  (LIPITOR) 10 MG tablet Take 1 tablet (10 mg total) by mouth daily. 11/09/20  Yes Boscia, Heather E, NP  budesonide -formoterol  (SYMBICORT ) 160-4.5 MCG/ACT inhaler Inhale 2 puffs into the lungs 2 (two) times daily. 08/24/20  Yes Parrett, Tammy S, NP  clobetasol  ointment (TEMOVATE ) 0.05 % Apply 1 application topically daily as needed. 11/03/18  Yes Scarboro, Adam J, NP  cyclobenzaprine (FLEXERIL) 5 MG tablet Take 5 mg by mouth 3 (three) times daily as needed for muscle spasms.   Yes [provider]  triamcinolone  ointment (KENALOG ) 0.5 % Apply 1 application topically 2 (two) times daily. 11/03/18  Yes Scarboro, Juliene PARAS, NP  valACYclovir  (VALTREX ) 1000 MG tablet  Take 1 tablet (1,000 mg total) by mouth 2 (two) times daily. 02/12/20  Yes Boscia, Heather E, NP  zolpidem  (AMBIEN ) 10 MG tablet Take 1 tablet (10 mg total) by mouth at bedtime as needed for sleep. 11/09/20  Yes  Hanford Powell BRAVO, NP   Allergies  Allergen Reactions   Other Itching and Rash   Gabapentin Other (See Comments)    Eye twitch   Pregabalin Other (See Comments)    Altered mental status  pregabalin   Wound Dressing Adhesive Dermatitis    Adhesive agent (substance)   Tape Rash    Patient states she had blisters and it was very itchy, ok to use paper tape and tegaderm    BP 120/80   Pulse 83   Temp 98.5 F (36.9 C)   Ht 5' 4 (1.626 m)   Wt 186 lb 12.8 oz (84.7 kg)   SpO2 98%   BMI 32.06 kg/m      Review of Systems: Gen:  Denies  fever, sweats, chills weight loss  HEENT: Denies blurred vision, double vision, ear pain, eye pain, hearing loss, nose bleeds, sore throat Cardiac:  No dizziness, chest pain or heaviness, chest tightness,edema, No JVD Resp:   + cough, -sputum production, +shortness of breath,+wheezing, -hemoptysis,  Other:  All other systems negative   Physical Examination:   General Appearance: No distress  EYES PERRLA, EOM intact.   NECK Supple, No JVD Pulmonary: normal breath sounds, +wheezing.  CardiovascularNormal S1,S2.  No m/r/g.   Abdomen: Benign, Soft, non-tender. Neurology UE/LE 5/5 strength, no focal deficits Ext pulses intact, cap refill intact ALL OTHER ROS ARE NEGATIVE    ASSESSMENT AND PLAN SYNOPSIS   63 year old obese white female seen today for follow-up assessment for asthma and OSA in the setting of obesity and deconditioned state  Acute moderate persistent asthma with asthma exacerbation due to COVID infection Prednisone  20 mg daily for 10 days Z-Pak to cover atypical infection Continue Symbicort  2 puffs in the morning 2 puffs at night Rinse mouth after use Albuterol  MDIs and nebs as needed Avoid Allergens and Irritants Avoid secondhand smoke Avoid SICK contacts Recommend  Masking  when appropriate Recommend Keep up-to-date with vaccinations  Assessment of OSA Diagnosis of OSA with AHI of 20 auto CPAP 5-12 Continue  current prescription Patient uses and benefits from therapy Using CPAP nightly.  pressure setting is comfortable and she is sleeping well.  No evidence of acute heart failure at this time Patient Instructions Continue to use CPAP every night, minimum of 4-6 hours a night.  Change equipment every 30 days or as directed by DME.  Wash your tubing with warm soap and water daily, hang to dry. Wash humidifier portion weekly. Use bottled, distilled water and change daily   Be aware of reduced alertness and do not drive or operate heavy machinery if experiencing this or drowsiness.  Exercise encouraged, as tolerated. Encouraged proper weight management.  Important to get eight or more hours of sleep  Limiting the use of the computer and television before bedtime.  Decrease naps during the day, so night time sleep will become enhanced.  Limit caffeine, and sleep deprivation.  HTN, stroke, uncontrolled diabetes and heart failure are potential risk factors.  Risk of untreated sleep apnea including cardiac arrhthymias, stroke, DM, pulm HTN.    Obesity -recommend significant weight loss -recommend changing diet  Deconditioned state -Recommend increased daily activity and exercise     MEDICATION ADJUSTMENTS/LABS AND TESTS ORDERED: Symbicort  2 puffs in  the morning 2 puffs at night Albuterol  and albuterol  nebulizer as needed Prednisone  20 mg daily for 10 days Z-Pak Continue CPAP as prescribed Avoid Allergens and Irritants Avoid secondhand smoke Avoid SICK contacts Recommend  Masking  when appropriate Recommend Keep up-to-date with vaccinations   CURRENT MEDICATIONS REVIEWED AT LENGTH WITH PATIENT TODAY   Patient  satisfied with Plan of action and management. All questions answered   Follow up 3 months   I spent a total of 47 minutes dedicated to the care of this patient on the date of this encounter to include pre-visit review of records, face-to-face time with the patient  discussing conditions above, post visit ordering of testing, clinical documentation with the electronic health record, making appropriate referrals as documented, and communicating necessary information to the patient's healthcare team.    The Patient requires high complexity decision making for assessment and support, frequent evaluation and titration of therapies, application of advanced monitoring technologies and extensive interpretation of multiple databases.  Patient satisfied with Plan of action and management. All questions answered    Nickolas Alm Cellar, M.D.  [facility]  Medical Director [facility] Medical Director [facility] Cardio-Pulmonary Department

## 2024-08-28 NOTE — Patient Instructions (Addendum)
 Recommend prednisone  20 mg daily for 10 days Recommend Z-Pak Please use mask  Use Symbicort  2 puffs in the morning 2 puffs at night Please rinse mouth after use  You can use albuterol  inhaler as needed Albuterol  nebulizers every 4 hours as needed  Avoid Allergens and Irritants Avoid secondhand smoke Avoid SICK contacts Recommend  Masking  when appropriate Recommend Keep up-to-date with vaccinations  Continue CPAP as prescribed Contact [facility] about getting your CPAP fixed or replaced.  [facility]  [address] [place], [place] [address]  Phone: [phone]  Fax: [phone]

## 2024-09-03 ENCOUNTER — Ambulatory Visit: Admitting: Internal Medicine

## 2024-09-03 ENCOUNTER — Encounter: Payer: Self-pay | Admitting: Internal Medicine

## 2024-09-03 VITALS — BP 120/80 | HR 83 | Ht 64.0 in | Wt 187.2 lb

## 2024-09-03 DIAGNOSIS — J4541 Moderate persistent asthma with (acute) exacerbation: Secondary | ICD-10-CM | POA: Diagnosis not present

## 2024-09-03 DIAGNOSIS — U071 COVID-19: Secondary | ICD-10-CM | POA: Diagnosis not present

## 2024-09-03 MED ORDER — ALBUTEROL SULFATE (2.5 MG/3ML) 0.083% IN NEBU: 2.5000 mg | INHALATION_SOLUTION | RESPIRATORY_TRACT | 2 refills | Status: AC | PRN

## 2024-09-03 MED ORDER — METHYLPREDNISOLONE ACETATE 80 MG/ML IJ SUSP
80.0000 mg | Freq: Once | INTRAMUSCULAR | Status: AC
  Administered 2024-09-03: 80 mg via INTRAMUSCULAR

## 2024-09-03 NOTE — Patient Instructions (Signed)
 Continue prednisone  as prescribed Continue inhalers as prescribed, Nebulizers as prescribed If symptoms get worse please proceed to ER for further evaluation  Avoid Allergens and Irritants Avoid secondhand smoke Avoid SICK contacts Recommend  Masking  when appropriate Recommend Keep up-to-date with vaccinations

## 2024-09-03 NOTE — Progress Notes (Signed)
 Name: Janet Austin MRN: [id number] DOB: [birth year]     SYNOPSIS 62 year old female never smoker followed for mild intermittent asthma, pulmonary nodules (serial follow up , resolved on CT chest 12/2016)  Medical history significant for breast cancer 2010 s/p bilateral mastectomy ,and chemo .      TEST/EVENTS :  Pulmonary function testing done July 2018 showed no airflow obstruction with FEV1 at 81%, ratio 86, FVC 77% no significant bronchodilator response, DLCO 52%, mid flow obstruction and reversibility noted  CXR 10/2016 clear lungs  diagnosed with obstructive sleep apnea Sleep study documents and reports reviewed in detail with the patient She snored 415 times AHI was 20 She desatted 134 times Lowest O2 sat was 75%    CC Follow-up assessment for asthma exacerbation Active COVID infection    HPI Asthma exacerbation due to COVID infection Tested +2 weeks ago Sick contact husband with COVID Continue cough congestion  Patient was given antibiotics with azithromycin  and prednisone  patient returns today for increased shortness of breath and wheezing  Ambulating pulse ox in the office today showed no significant hypoxia Depo-Medrol  to be given   PAST MEDICAL HISTORY :   has a past medical history of Anxiety, Arthritis, Asthma, Cancer (HCC), COVID (08/14/2024), Diabetes mellitus without complication (HCC), Headache, Hyperlipidemia, PONV (postoperative nausea and vomiting), and Sleep apnea.  has a past surgical history that includes Total abdominal hysterectomy; Bilateral total mastectomy with axillary lymph node dissection; Breast surgery; Mastectomy; Knee arthroscopy (Right); Colonoscopy with propofol  (N/A, 05/29/2016); and Colonoscopy (N/A, 04/01/2024). Prior to Admission medications   Medication Sig Start Date End Date Taking? Authorizing Provider  albuterol  (VENTOLIN  HFA) 108 (90 Base) MCG/ACT inhaler Inhale 2 puffs into the lungs every 6 (six) hours as needed for wheezing  or shortness of breath. 02/12/20  Yes Boscia, Heather E, NP  ALPRAZolam  (XANAX ) 1 MG tablet Take 1 tablet (1 mg total) by mouth at bedtime as needed for anxiety. 11/09/20  Yes Boscia, Heather E, NP  atorvastatin  (LIPITOR) 10 MG tablet Take 1 tablet (10 mg total) by mouth daily. 11/09/20  Yes Boscia, Heather E, NP  budesonide -formoterol  (SYMBICORT ) 160-4.5 MCG/ACT inhaler Inhale 2 puffs into the lungs 2 (two) times daily. 08/24/20  Yes Parrett, Tammy S, NP  clobetasol  ointment (TEMOVATE ) 0.05 % Apply 1 application topically daily as needed. 11/03/18  Yes Scarboro, Adam J, NP  cyclobenzaprine (FLEXERIL) 5 MG tablet Take 5 mg by mouth 3 (three) times daily as needed for muscle spasms.   Yes [provider]  triamcinolone  ointment (KENALOG ) 0.5 % Apply 1 application topically 2 (two) times daily. 11/03/18  Yes Scarboro, Juliene PARAS, NP  valACYclovir  (VALTREX ) 1000 MG tablet Take 1 tablet (1,000 mg total) by mouth 2 (two) times daily. 02/12/20  Yes Boscia, Heather E, NP  zolpidem  (AMBIEN ) 10 MG tablet Take 1 tablet (10 mg total) by mouth at bedtime as needed for sleep. 11/09/20  Yes Hanford Powell BRAVO, NP   Allergies  Allergen Reactions   Other Itching and Rash   Gabapentin Other (See Comments)    Eye twitch   Pregabalin Other (See Comments)    Altered mental status  pregabalin   Wound Dressing Adhesive Dermatitis    Adhesive agent (substance)   Tape Rash    Patient states she had blisters and it was very itchy, ok to use paper tape and tegaderm    BP 120/80   Pulse 83   Ht 5' 4 (1.626 m)   Wt 187 lb 3.2 oz (  84.9 kg)   SpO2 93%   BMI 32.13 kg/m   Physical Examination:   General Appearance: No distress  EYES PERRLA, EOM intact.   NECK Supple, No JVD Pulmonary: normal breath sounds, No wheezing.  CardiovascularNormal S1,S2.  No m/r/g.   Abdomen: Benign, Soft, non-tender. Neurology UE/LE 5/5 strength, no focal deficits Ext pulses intact, cap refill intact ALL OTHER ROS ARE  NEGATIVE   ASSESSMENT AND PLAN SYNOPSIS   63 year old obese white female seen today for follow-up assessment for asthma and OSA in the setting of obesity and deconditioned state  Acute moderate persistent asthma with asthma exacerbation due to COVID infection Patient return to office for increased shortness of breath Plan for Depo-Medrol  shot in the office today Continue nebulizers as prescribed Patient advised to go to ER if symptoms get worse Continue inhalers as prescribed   MEDICATION ADJUSTMENTS/LABS AND TESTS ORDERED: Symbicort  2 puffs in the morning 2 puffs at night Albuterol  and albuterol  nebulizer as needed Depo-Medrol  shot in the office today Continue CPAP as prescribed Avoid Allergens and Irritants Avoid secondhand smoke Avoid SICK contacts Recommend  Masking  when appropriate Recommend Keep up-to-date with vaccinations    CURRENT MEDICATIONS REVIEWED AT LENGTH WITH PATIENT TODAY   Patient  satisfied with Plan of action and management. All questions answered   Follow up 3 months   I spent a total of 41 minutes dedicated to the care of this patient on the date of this encounter to include pre-visit review of records, face-to-face time with the patient discussing conditions above, post visit ordering of testing, clinical documentation with the electronic health record, making appropriate referrals as documented, and communicating necessary information to the patient's healthcare team.    The Patient requires high complexity decision making for assessment and support, frequent evaluation and titration of therapies, application of advanced monitoring technologies and extensive interpretation of multiple databases.  Patient satisfied with Plan of action and management. All questions answered    Nickolas Alm Cellar, M.D.  [facility]  Medical Director [facility] Medical Director [facility] Cardio-Pulmonary Department

## 2024-09-16 NOTE — Progress Notes (Signed)
 GRACIANA SESSA                                          MRN: [id number]   09/16/2024   The [facility] Quality Team Specialist reviewed this patient medical record for the purposes of chart review for care gap closure. The following were reviewed: chart review for care gap closure-kidney health evaluation for diabetes:eGFR  and uACR.    [facility] Quality Team

## 2024-09-28 ENCOUNTER — Ambulatory Visit: Admitting: Dermatology

## 2024-09-28 DIAGNOSIS — M1711 Unilateral primary osteoarthritis, right knee: Secondary | ICD-10-CM | POA: Diagnosis not present

## 2024-10-11 ENCOUNTER — Other Ambulatory Visit: Payer: Self-pay | Admitting: Internal Medicine

## 2024-10-11 DIAGNOSIS — J4540 Moderate persistent asthma, uncomplicated: Secondary | ICD-10-CM

## 2024-10-15 ENCOUNTER — Ambulatory Visit: Payer: Self-pay

## 2024-10-15 NOTE — Telephone Encounter (Signed)
 Copied from CRM [id number]. Topic: Clinical - Red Word Triage >> Oct 15, 2024  2:27 PM Roselie BROCKS wrote: Red Word that prompted transfer to Nurse Triage: Patient states that she has pretty bad pain in right ear, and its bothersome Reason for Disposition  Earache  (Exceptions: Brief ear pain of lasting less than 60 minutes, or earache occurring during air travel.)  Answer Assessment - Initial Assessment Questions 1. LOCATION: Which ear is involved?     Right ear 2. ONSET: When did the ear pain start?      8th day 3. SEVERITY: How bad is the pain?  (Scale 1-10; mild, moderate or severe)     3-4/10 4. URI SYMPTOMS: Do you have a runny nose or cough?     Cough - had covid on 08/14/2024  5. FEVER: Do you have a fever? If Yes, ask: What is your temperature, how was it measured, and when did it start?     no 6. CAUSE: Have you been swimming recently?, How often do you use Q-TIPS?, Have you had any recent air travel or scuba diving?     When she swallows the pain is the worst 7. OTHER SYMPTOMS: Do you have any other symptoms? (e.g., decreased hearing, dizziness, headache, stiff neck, vomiting)     no  Protocols used: Earache-A-AH

## 2024-10-16 ENCOUNTER — Ambulatory Visit

## 2024-10-16 VITALS — BP 111/67 | HR 83 | Resp 16 | Ht 64.0 in | Wt 180.0 lb

## 2024-10-16 DIAGNOSIS — H6690 Otitis media, unspecified, unspecified ear: Secondary | ICD-10-CM | POA: Diagnosis not present

## 2024-10-16 MED ORDER — AMOXICILLIN-POT CLAVULANATE 875-125 MG PO TABS: 1.0000 | ORAL_TABLET | Freq: Two times a day (BID) | ORAL | 0 refills | Status: AC

## 2024-10-16 NOTE — Progress Notes (Signed)
 Acute visit   Patient: Janet Austin   DOB: 1961-02-28   63 y.o. Female  MRN: [id number] PCP: Gretel App, NP   Chief Complaint  Patient presents with   Acute Visit    Ear pain( 9 days) Rt swelling    Subjective    Discussed the use of AI scribe software for clinical note transcription with the patient, who gave verbal consent to proceed.  History of Present Illness Janet Austin is a 63 year old female with asthma who presents with right ear pain and sinus pressure.  She has been experiencing right ear pain for the past 10 days, which has worsened. The pain is sharp, particularly when swallowing, and she experiences pain in both her ear and throat. She has been using a heating pad for relief.  In addition to the ear pain, she experiences sinus pressure predominantly on the right side, accompanied by congestion and a dripping right nostril. No fever or chills, but she mentions being 'hot natured' and not feeling fevers easily. No wheezing or difficulty breathing.  Her asthma management includes two puffs of Symbicort  in the morning and evening. She also uses albuterol  with a nebulizer frequently. She has been using saline spray.  Review of systems as noted in HPI.   Objective    BP 111/67 (BP Location: Left Arm, Patient Position: Sitting)   Pulse 83   Resp 16   Ht 5' 4 (1.626 m)   Wt 180 lb (81.6 kg)   SpO2 100%   BMI 30.90 kg/m  Physical Exam Constitutional:      Appearance: Normal appearance.  HENT:     Head: Normocephalic and atraumatic.     Right Ear: Ear canal and external ear normal. A middle ear effusion is present. Tympanic membrane is erythematous and bulging.     Left Ear: Tympanic membrane, ear canal and external ear normal.     Nose:     Right Sinus: Maxillary sinus tenderness and frontal sinus tenderness present.     Mouth/Throat:     Mouth: Mucous membranes are moist.     Pharynx: Posterior oropharyngeal erythema present.  Eyes:     Pupils: Pupils  are equal, round, and reactive to light.  Cardiovascular:     Rate and Rhythm: Normal rate and regular rhythm.     Heart sounds: Normal heart sounds.  Pulmonary:     Effort: Pulmonary effort is normal.     Breath sounds: Normal breath sounds. No wheezing.  Skin:    General: Skin is warm.  Neurological:     General: No focal deficit present.     Mental Status: She is alert.      No results found for any visits on 10/16/24.  Assessment & Plan     Problem List Items Addressed This Visit       Nervous and Auditory   Acute otitis media - Primary   Relevant Medications   amoxicillin -clavulanate (AUGMENTIN ) 875-125 MG tablet   Assessment & Plan Acute right otitis media Patient with 10 day hx of R ear pain and R sinus tenderness. TM is erythematous, bulging, and cloudy indicating infection. No fever or chills. - Prescribed Augmentin  875 mg twice daily for 7 days. - Advised Advil as needed for pain management  - Recommended continuation of saline spray for nasal congestion. - Discussed potential use of Flonase - Instructed to return for evaluation if symptoms do not improve after completing antibiotics.   Meds ordered this  encounter  Medications   amoxicillin -clavulanate (AUGMENTIN ) 875-125 MG tablet    Sig: Take 1 tablet by mouth 2 (two) times daily for 7 days.    Dispense:  14 tablet    Refill:  0     No follow-ups on file.      Isaiah DELENA Pepper, MD  [facility] [phone] (phone) [phone] (fax)

## 2024-10-22 ENCOUNTER — Other Ambulatory Visit: Payer: Self-pay | Admitting: Nurse Practitioner

## 2024-10-24 ENCOUNTER — Ambulatory Visit
Admission: RE | Admit: 2024-10-24 | Discharge: 2024-10-24 | Disposition: A | Discharge status: Home / Self Care | Payer: Self-pay | Source: Ambulatory Visit | Attending: Emergency Medicine | Admitting: Emergency Medicine

## 2024-10-24 VITALS — BP 118/77 | HR 85 | Temp 98.2°F | Resp 18

## 2024-10-24 DIAGNOSIS — J0140 Acute pansinusitis, unspecified: Secondary | ICD-10-CM | POA: Diagnosis not present

## 2024-10-24 MED ORDER — PREDNISONE 10 MG (21) PO TBPK: ORAL_TABLET | Freq: Every day | ORAL | 0 refills | Status: DC

## 2024-10-24 MED ORDER — CEFDINIR 300 MG PO CAPS: 300.0000 mg | ORAL_CAPSULE | Freq: Two times a day (BID) | ORAL | 0 refills | Status: AC

## 2024-10-24 NOTE — ED Triage Notes (Signed)
 Patient is in room 5. Patient reports cough, right ear pain and right nasal drainage with mucus x 2 weeks. Patient was treated for sinus infection and ear infection and finished antibiotics yesterday morning.  Rates pain 2/10.

## 2024-10-24 NOTE — Discharge Instructions (Signed)
 On exam your ear infection has cleared up and I do believe your persistent pain and discomfort is coming from your sinuses  Take cefdinir twice daily for 7 days for additional bacterial coverage  Begin prednisone  every morning to reduce sinus pressure avoid ibuprofen while taking but may take Tylenol  You can take Tylenol  as needed for fever reduction and pain relief.   For cough: honey 1/2 to 1 teaspoon (you can dilute the honey in water or another fluid).  You can also use guaifenesin and dextromethorphan for cough. You can use a humidifier for chest congestion and cough.  If you don't have a humidifier, you can sit in the bathroom with the hot shower running.      For sore throat: try warm salt water gargles, cepacol lozenges, throat spray, warm tea or water with lemon/honey, popsicles or ice, or OTC cold relief medicine for throat discomfort.   For congestion: take a daily anti-histamine like Zyrtec, Claritin, and a oral decongestant, such as pseudoephedrine.  You can also use Flonase 1-2 sprays in each nostril daily.   It is important to stay hydrated: drink plenty of fluids (water, gatorade/powerade/pedialyte, juices, or teas) to keep your throat moisturized and help further relieve irritation/discomfort.

## 2024-10-24 NOTE — ED Provider Notes (Signed)
 Janet Austin    CSN: [id number] Arrival date & time: 10/24/24  1232      History   Chief Complaint Chief Complaint  Patient presents with   Cough    Ear/sinus infection continues. Finished 7 days Augmentin  this morning. Cough - Entered by patient   Otalgia   Nasal Congestion    HPI Janet Austin is a 63 y.o. female.     Patient presents for evaluation of persisting nasal congestion, rhinorrhea, right sided ear pain, sinus pain and pressure to the right side, tenderness to the right side of the throat, productive cough with thick mucus and wheezing with cough at nighttime present for 2 weeks.  Was evaluated by primary doctor and started on antibiotics for right-sided ear infection and sinus infection, completed last dose yesterday morning, did see improvement in symptoms but did not fully resolve.  Initial fever peaking at 99.5 resolved.  No known sick contact prior.  Tolerable to food and liquids.  Has additionally used over-the-counter medications.       Past Medical History:  Diagnosis Date   Anxiety    Arthritis    Asthma    [employer]rees[employer] 02/13/2021   Cancer (HCC)    left breast   COVID 08/14/2024   Diabetes mellitus without complication (HCC)    Headache    migraines   Hyperlipidemia    [employer]reesia 02/13/2021   PONV (postoperative nausea and vomiting)    Sleep apnea    uses CPAP    Patient Active Problem List   Diagnosis Date Noted   Acute otitis media 10/16/2024   Drug-related hair loss 05/20/2024   Encounter for screening colonoscopy 04/01/2024   Polyp of transverse colon 04/01/2024   Vitamin D  deficiency 06/16/2023   Type 2 diabetes mellitus (HCC) 06/16/2023   BMI 40.0-44.9, adult (HCC) 01/31/2022   Elevated blood pressure reading without diagnosis of hypertension 03/27/2021   Eczema 02/14/2021   History of breast cancer in female 11/09/2020   Daytime hypersomnolence 08/24/2020   Other fatigue 02/12/2020   Seasonal allergic rhinitis due to pollen  08/03/2019   Mild intermittent asthma without complication 08/22/2018   Hyperlipidemia associated with type 2 diabetes mellitus (HCC) 07/27/2018   Generalized anxiety disorder 03/11/2018   Primary insomnia 03/11/2018    Past Surgical History:  Procedure Laterality Date   BILATERAL TOTAL MASTECTOMY WITH AXILLARY LYMPH NODE DISSECTION     double mastectomy   BREAST SURGERY     reconstruction   COLONOSCOPY N/A 04/01/2024   Procedure: COLONOSCOPY;  Surgeon: Jinny Carmine, MD;  Location: [facility] ENDOSCOPY;  Service: Endoscopy;  Laterality: N/A;   COLONOSCOPY WITH PROPOFOL  N/A 05/29/2016   Procedure: COLONOSCOPY WITH PROPOFOL ;  Surgeon: Gladis RAYMOND Mariner, MD;  Location: [facility] ENDOSCOPY;  Service: Endoscopy;  Laterality: N/A;   KNEE ARTHROSCOPY Right    MASTECTOMY     TOTAL ABDOMINAL HYSTERECTOMY      OB History   No obstetric history on file.      Home Medications    Prior to Admission medications   Medication Sig Start Date End Date Taking? Authorizing Provider  albuterol  (ACCUNEB ) 1.25 MG/3ML nebulizer solution Take 3 mLs (1.25 mg total) by nebulization every 6 (six) hours as needed for wheezing. 08/28/24   Kasa, Kurian, MD  albuterol  (PROVENTIL ) (2.5 MG/3ML) 0.083% nebulizer solution Take 3 mLs (2.5 mg total) by nebulization every 6 (six) hours as needed for wheezing or shortness of breath. 06/09/24   Isaiah Scrivener, MD  albuterol  (PROVENTIL ) (2.5 MG/3ML)  0.083% nebulizer solution Take 3 mLs (2.5 mg total) by nebulization every 4 (four) hours as needed for wheezing or shortness of breath. 09/03/24 09/03/25  Kasa, Kurian, MD  albuterol  (VENTOLIN  HFA) 108 (90 Base) MCG/ACT inhaler Inhale 2 puffs into the lungs every 6 (six) hours as needed for wheezing or shortness of breath. 10/24/23   Wallace Joesph LABOR, PA  ALPRAZolam  (XANAX ) 0.5 MG tablet Take 1 tablet (0.5 mg total) by mouth daily as needed for anxiety. 03/17/24   Gretel App, NP  atorvastatin  (LIPITOR) 10 MG tablet TAKE ONE TABLET BY MOUTH ONE  TIME DAILY 04/02/24   Gretel App, NP  budesonide -formoterol  (BREYNA ) 160-4.5 MCG/ACT inhaler Inhale 2 puffs into the lungs daily.    [provider]  budesonide -formoterol  (SYMBICORT ) 160-4.5 MCG/ACT inhaler Inhale 2 puffs into the lungs daily. 08/14/24   Teresa Shelba SAUNDERS, NP  budesonide -formoterol  (SYMBICORT ) 160-4.5 MCG/ACT inhaler INHALE 2 PUFFS INTO THE LUNGS DAILY 10/12/24   Kasa, Kurian, MD  busPIRone  (BUSPAR ) 5 MG tablet TAKE 1 TABLET(5 MG) BY MOUTH THREE TIMES DAILY 01/29/24   Wallace Joesph A, PA  Ibuprofen (ADVIL) 200 MG CAPS     [provider]  tirzepatide  (MOUNJARO ) 7.5 MG/0.5ML Pen ADMINISTER 7.5 MG UNDER THE SKIN 1 TIME A WEEK 10/23/24   Gretel App, NP  zolpidem  (AMBIEN ) 10 MG tablet Take 10 mg by mouth at bedtime as needed for sleep.    [provider]    Family History Family History  Problem Relation Age of Onset   Hypertension Father     Social History Social History   Tobacco Use   Smoking status: Never    Passive exposure: Never   Smokeless tobacco: Never  Vaping Use   Vaping status: Never Used  Substance Use Topics   Alcohol use: No   Drug use: No     Allergies   Other, Gabapentin, Pregabalin, Wound dressing adhesive, and Tape   Review of Systems Review of Systems  HENT:  Positive for congestion, ear pain, sinus pressure, sinus pain and sore throat. Negative for dental problem, drooling, ear discharge, facial swelling, hearing loss, mouth sores, nosebleeds, postnasal drip, rhinorrhea, sneezing, tinnitus, trouble swallowing and voice change.   Respiratory:  Positive for cough and wheezing. Negative for apnea, choking, chest tightness, shortness of breath and stridor.   Cardiovascular: Negative.   Gastrointestinal: Negative.      Physical Exam Triage Vital Signs ED Triage Vitals  Encounter Vitals Group     BP 10/24/24 1254 118/77     Girls Systolic BP Percentile --      Girls Diastolic BP Percentile --      Boys  Systolic BP Percentile --      Boys Diastolic BP Percentile --      Pulse Rate 10/24/24 1254 85     Resp 10/24/24 1254 18     Temp 10/24/24 1254 98.2 F (36.8 C)     Temp Source 10/24/24 1254 Oral     SpO2 10/24/24 1254 98 %     Weight --      Height --      Head Circumference --      Peak Flow --      Pain Score 10/24/24 1259 2     Pain Loc --      Pain Education --      Exclude from Growth Chart --    No data found.  Updated Vital Signs BP 118/77 (BP Location: Left Arm)   Pulse  85   Temp 98.2 F (36.8 C) (Oral)   Resp 18   SpO2 98%   Visual Acuity Right Eye Distance:   Left Eye Distance:   Bilateral Distance:    Right Eye Near:   Left Eye Near:    Bilateral Near:     Physical Exam Constitutional:      Appearance: Normal appearance.  HENT:     Right Ear: Tympanic membrane, ear canal and external ear normal. There is no impacted cerumen.     Left Ear: Tympanic membrane, ear canal and external ear normal.     Nose: Congestion present.     Right Sinus: Maxillary sinus tenderness and frontal sinus tenderness present.     Mouth/Throat:     Pharynx: No oropharyngeal exudate or posterior oropharyngeal erythema.  Eyes:     Extraocular Movements: Extraocular movements intact.  Cardiovascular:     Rate and Rhythm: Normal rate and regular rhythm.     Pulses: Normal pulses.     Heart sounds: Normal heart sounds.  Pulmonary:     Effort: Pulmonary effort is normal.     Breath sounds: Normal breath sounds.  Lymphadenopathy:     Cervical: Cervical adenopathy present.  Neurological:     Mental Status: She is alert and oriented to person, place, and time. Mental status is at baseline.      UC Treatments / Results  Labs (all labs ordered are listed, but only abnormal results are displayed) Labs Reviewed - No data to display  EKG   Radiology No results found.  Procedures Procedures (including critical care time)  Medications Ordered in UC Medications - No  data to display  Initial Impression / Assessment and Plan / UC Course  I have reviewed the triage vital signs and the nursing notes.  Pertinent labs & imaging results that were available during my care of the patient were reviewed by me and considered in my medical decision making (see chart for details).  Acute nonrecurrent pansinusitis  Patient is in no signs of distress nor toxic appearing.  Vital signs are stable.  Low suspicion for pneumonia, pneumothorax or bronchitis and therefore will defer imaging.  Ear infection has resolved, discussed this with patient, persisting symptoms most likely related to the sinuses will provide additional bacterial coverage with cefdinir and added prednisone .\May use additional over-the-counter medications as needed for supportive care.  May follow-up with urgent care as needed if symptoms persist or worsen.   Final Clinical Impressions(s) / UC Diagnoses   Final diagnoses:  None   Discharge Instructions   None    ED Prescriptions   None    PDMP not reviewed this encounter.   Teresa Shelba SAUNDERS, NP 10/24/24 1329

## 2024-10-26 ENCOUNTER — Ambulatory Visit: Admitting: Nurse Practitioner

## 2024-11-18 ENCOUNTER — Encounter: Payer: Self-pay | Admitting: Nurse Practitioner

## 2024-11-18 ENCOUNTER — Ambulatory Visit (INDEPENDENT_AMBULATORY_CARE_PROVIDER_SITE_OTHER): Admitting: Nurse Practitioner

## 2024-11-18 VITALS — BP 120/72 | HR 82 | Temp 98.3°F | Ht 64.0 in | Wt 191.4 lb

## 2024-11-18 DIAGNOSIS — Z853 Personal history of malignant neoplasm of breast: Secondary | ICD-10-CM

## 2024-11-18 DIAGNOSIS — J069 Acute upper respiratory infection, unspecified: Secondary | ICD-10-CM

## 2024-11-18 DIAGNOSIS — F411 Generalized anxiety disorder: Secondary | ICD-10-CM

## 2024-11-18 DIAGNOSIS — E1169 Type 2 diabetes mellitus with other specified complication: Secondary | ICD-10-CM | POA: Diagnosis not present

## 2024-11-18 DIAGNOSIS — Z7985 Long-term (current) use of injectable non-insulin antidiabetic drugs: Secondary | ICD-10-CM

## 2024-11-18 DIAGNOSIS — E119 Type 2 diabetes mellitus without complications: Secondary | ICD-10-CM

## 2024-11-18 DIAGNOSIS — E785 Hyperlipidemia, unspecified: Secondary | ICD-10-CM | POA: Diagnosis not present

## 2024-11-18 LAB — POCT GLYCOSYLATED HEMOGLOBIN (HGB A1C)
HbA1c POC (<> result, manual entry): 5.1 % (ref 4.0–5.6)
HbA1c, POC (controlled diabetic range): 5.1 % (ref 0.0–7.0)
HbA1c, POC (prediabetic range): 5.1 % — AB (ref 5.7–6.4)
Hemoglobin A1C: 5.1 % (ref 4.0–5.6)

## 2024-11-18 MED ORDER — METHYLPREDNISOLONE 4 MG PO TBPK: ORAL_TABLET | ORAL | 0 refills | Status: AC

## 2024-11-18 MED ORDER — AZITHROMYCIN 250 MG PO TABS: ORAL_TABLET | ORAL | 0 refills | Status: AC

## 2024-11-18 MED ORDER — PSEUDOEPH-BROMPHEN-DM 30-2-10 MG/5ML PO SYRP: 5.0000 mL | ORAL_SOLUTION | Freq: Four times a day (QID) | ORAL | 0 refills | Status: AC | PRN

## 2024-11-18 MED ORDER — BUSPIRONE HCL 5 MG PO TABS: 5.0000 mg | ORAL_TABLET | Freq: Two times a day (BID) | ORAL | 5 refills | Status: AC

## 2024-11-18 MED ORDER — TIRZEPATIDE 10 MG/0.5ML ~~LOC~~ SOAJ: 10.0000 mg | SUBCUTANEOUS | 2 refills | Status: AC

## 2024-11-18 MED ORDER — ALPRAZOLAM 0.5 MG PO TABS: 0.5000 mg | ORAL_TABLET | Freq: Every day | ORAL | 2 refills | Status: AC | PRN

## 2024-11-18 MED ORDER — ATORVASTATIN CALCIUM 10 MG PO TABS: 10.0000 mg | ORAL_TABLET | Freq: Every day | ORAL | 3 refills | Status: AC

## 2024-11-18 NOTE — Progress Notes (Unsigned)
  Leron Glance, NP-C Phone: [phone]  Janet Austin is a 63 y.o. female who presents today for follow up.   ***  Social History   Tobacco Use  Smoking Status Never  . Passive exposure: Never  Smokeless Tobacco Never    Current Outpatient Medications on File Prior to Visit  Medication Sig Dispense Refill  . albuterol  (ACCUNEB ) 1.25 MG/3ML nebulizer solution Take 3 mLs (1.25 mg total) by nebulization every 6 (six) hours as needed for wheezing. 75 mL 12  . albuterol  (PROVENTIL ) (2.5 MG/3ML) 0.083% nebulizer solution Take 3 mLs (2.5 mg total) by nebulization every 6 (six) hours as needed for wheezing or shortness of breath. 75 mL 11  . albuterol  (PROVENTIL ) (2.5 MG/3ML) 0.083% nebulizer solution Take 3 mLs (2.5 mg total) by nebulization every 4 (four) hours as needed for wheezing or shortness of breath. 75 mL 2  . albuterol  (VENTOLIN  HFA) 108 (90 Base) MCG/ACT inhaler Inhale 2 puffs into the lungs every 6 (six) hours as needed for wheezing or shortness of breath. 54 g 3  . budesonide -formoterol  (SYMBICORT ) 160-4.5 MCG/ACT inhaler INHALE 2 PUFFS INTO THE LUNGS DAILY 10.2 g 2  . Ibuprofen (ADVIL) 200 MG CAPS      No current facility-administered medications on file prior to visit.     ROS see history of present illness  Objective  Physical Exam Vitals:   11/18/24 1316  BP: 120/72  Pulse: 82  Temp: 98.3 F (36.8 C)  SpO2: 99%    BP Readings from Last 3 Encounters:  11/18/24 120/72  10/24/24 118/77  10/16/24 111/67   Wt Readings from Last 3 Encounters:  11/18/24 191 lb 6.4 oz (86.8 kg)  10/16/24 180 lb (81.6 kg)  09/03/24 187 lb 3.2 oz (84.9 kg)    Physical Exam   Assessment/Plan: Please see individual problem list.  Upper respiratory tract infection, unspecified type -     Azithromycin ; Take 2 tablets on day 1, then 1 tablet daily on days 2 through 5  Dispense: 6 tablet; Refill: 0 -     methylPREDNISolone ; Take as directed.  Dispense: 21 each; Refill: 0 -      Pseudoeph-Bromphen-DM; Take 5-10 mLs by mouth every 6 (six) hours as needed.  Dispense: 120 mL; Refill: 0  Type 2 diabetes mellitus without complication, without long-term current use of insulin (HCC) -     Tirzepatide ; Inject 10 mg into the skin once a week.  Dispense: 2 mL; Refill: 2 -     POCT glycosylated hemoglobin (Hb A1C)  Hyperlipidemia associated with type 2 diabetes mellitus (HCC) -     Atorvastatin  Calcium ; Take 1 tablet (10 mg total) by mouth daily.  Dispense: 90 tablet; Refill: 3  Generalized anxiety disorder -     ALPRAZolam ; Take 1 tablet (0.5 mg total) by mouth daily as needed for anxiety.  Dispense: 30 tablet; Refill: 2 -     busPIRone  HCl; Take 1 tablet (5 mg total) by mouth 2 (two) times daily.  Dispense: 60 tablet; Refill: 5     Health Maintenance: ***  Return in about 3 months (around 02/16/2025) for Follow up.   Leron Glance, NP-C [facility]

## 2024-11-19 NOTE — Progress Notes (Signed)
 Janet Austin                                          MRN: [id number]   11/19/2024   The VBCI Quality Team Specialist reviewed this patient medical record for the purposes of chart review for care gap closure. The following were reviewed: chart review for care gap closure-kidney health evaluation for diabetes:eGFR  and uACR.    VBCI Quality Team

## 2024-11-20 ENCOUNTER — Other Ambulatory Visit: Payer: Self-pay | Admitting: Family Medicine

## 2024-11-20 DIAGNOSIS — F411 Generalized anxiety disorder: Secondary | ICD-10-CM

## 2024-11-22 DIAGNOSIS — J45998 Other asthma: Secondary | ICD-10-CM | POA: Diagnosis not present

## 2024-11-24 ENCOUNTER — Encounter: Payer: Self-pay | Admitting: Internal Medicine

## 2024-11-24 ENCOUNTER — Ambulatory Visit: Admitting: Internal Medicine

## 2024-11-24 ENCOUNTER — Telehealth: Payer: Self-pay

## 2024-11-24 VITALS — BP 120/80 | HR 88 | Temp 98.8°F | Wt 189.0 lb

## 2024-11-24 DIAGNOSIS — H40003 Preglaucoma, unspecified, bilateral: Secondary | ICD-10-CM | POA: Diagnosis not present

## 2024-11-24 DIAGNOSIS — G4733 Obstructive sleep apnea (adult) (pediatric): Secondary | ICD-10-CM | POA: Diagnosis not present

## 2024-11-24 DIAGNOSIS — J4540 Moderate persistent asthma, uncomplicated: Secondary | ICD-10-CM

## 2024-11-24 DIAGNOSIS — H04123 Dry eye syndrome of bilateral lacrimal glands: Secondary | ICD-10-CM | POA: Diagnosis not present

## 2024-11-24 DIAGNOSIS — M3501 Sicca syndrome with keratoconjunctivitis: Secondary | ICD-10-CM | POA: Diagnosis not present

## 2024-11-24 DIAGNOSIS — H43813 Vitreous degeneration, bilateral: Secondary | ICD-10-CM | POA: Diagnosis not present

## 2024-11-24 NOTE — Patient Instructions (Signed)
 Avoid Allergens and Irritants Avoid secondhand smoke Avoid SICK contacts Recommend  Masking  when appropriate Recommend Keep up-to-date with vaccinations  Continue Symbicort  as prescribed Rinse mouth after use Please bring a list of medications at next office visit for inhalers that your insurance will cover  Lets plan to discuss TEZPIRE injection therapy for your

## 2024-11-24 NOTE — Progress Notes (Signed)
 Name: Janet Austin MRN: [id number] DOB: 1961/03/12     SYNOPSIS 63 year old female never smoker followed for mild intermittent asthma, pulmonary nodules (serial follow up , resolved on CT chest 12/2016)  Medical history significant for breast cancer 2010 s/p bilateral mastectomy ,and chemo .      TEST/EVENTS :  Pulmonary function testing done July 2018 showed no airflow obstruction with FEV1 at 81%, ratio 86, FVC 77% no significant bronchodilator response, DLCO 52%, mid flow obstruction and reversibility noted  CXR 10/2016 clear lungs  diagnosed with obstructive sleep apnea Sleep study documents and reports reviewed in detail with the patient She snored 415 times AHI was 20 She desatted 134 times Lowest O2 sat was 75%    CC Follow-up assessment for asthma exacerbation Active COVID infection Follow-up assessment for OSA    HPI Previous office visit in September Patient received Paxlovid  prednisone  antibiotics at last office visit Patient feels much better since COVID however has residual cough Patient continues to use Symbicort  daily Patient using nebulizers as needed  We talked about immunotherapy biological therapy for ongoing signs symptoms of asthma Patient would like to revisit this at next office visit Patient still has ongoing subacute to chronic cough from COVID infection  Follow-up assessment for OSA  Discussed sleep data and reviewed with patient.  Encouraged proper weight management.  Discussed sleep hygiene Patient uses and benefits from therapy Using CPAP nightly and with naps Settings are comfortable and is sleeping well. She snored 415 times AHI was 20 She desatted 134 times Lowest O2 sat was 75%  PAST MEDICAL HISTORY :   has a past medical history of Anxiety, Arthritis, Asthma, Cancer (HCC), COVID (08/14/2024), Diabetes mellitus without complication (HCC), Headache, Hyperlipidemia, PONV (postoperative nausea and vomiting), and Sleep apnea.  has a  past surgical history that includes Total abdominal hysterectomy; Bilateral total mastectomy with axillary lymph node dissection; Breast surgery; Mastectomy; Knee arthroscopy (Right); Colonoscopy with propofol  (N/A, 05/29/2016); and Colonoscopy (N/A, 04/01/2024). Prior to Admission medications   Medication Sig Start Date End Date Taking? Authorizing Provider  albuterol  (VENTOLIN  HFA) 108 (90 Base) MCG/ACT inhaler Inhale 2 puffs into the lungs every 6 (six) hours as needed for wheezing or shortness of breath. 02/12/20  Yes Boscia, Heather E, NP  ALPRAZolam  (XANAX ) 1 MG tablet Take 1 tablet (1 mg total) by mouth at bedtime as needed for anxiety. 11/09/20  Yes Boscia, Heather E, NP  atorvastatin  (LIPITOR) 10 MG tablet Take 1 tablet (10 mg total) by mouth daily. 11/09/20  Yes Boscia, Heather E, NP  budesonide -formoterol  (SYMBICORT ) 160-4.5 MCG/ACT inhaler Inhale 2 puffs into the lungs 2 (two) times daily. 08/24/20  Yes Parrett, Tammy S, NP  clobetasol  ointment (TEMOVATE ) 0.05 % Apply 1 application topically daily as needed. 11/03/18  Yes Scarboro, Adam J, NP  cyclobenzaprine (FLEXERIL) 5 MG tablet Take 5 mg by mouth 3 (three) times daily as needed for muscle spasms.   Yes [provider]  triamcinolone  ointment (KENALOG ) 0.5 % Apply 1 application topically 2 (two) times daily. 11/03/18  Yes Scarboro, Juliene PARAS, NP  valACYclovir  (VALTREX ) 1000 MG tablet Take 1 tablet (1,000 mg total) by mouth 2 (two) times daily. 02/12/20  Yes Boscia, Heather E, NP  zolpidem  (AMBIEN ) 10 MG tablet Take 1 tablet (10 mg total) by mouth at bedtime as needed for sleep. 11/09/20  Yes Hanford Powell BRAVO, NP   Allergies  Allergen Reactions   Other Itching and Rash   Gabapentin Other (See Comments)  Eye twitch   Pregabalin Other (See Comments)    Altered mental status  pregabalin   Wound Dressing Adhesive Dermatitis    Adhesive agent (substance)   Tape Rash    Patient states she had blisters and it was very itchy, ok to  use paper tape and tegaderm    BP 120/80   Pulse 88   Temp 98.8 F (37.1 C)   Wt 189 lb (85.7 kg)   SpO2 98%   BMI 32.44 kg/m      Physical Examination:  General Appearance: No distress  EYES EOM intact.   NECK Supple, No JVD Pulmonary: normal breath sounds, No wheezing.  CardiovascularNormal S1,S2.  No m/r/g.   Ext pulses intact, cap refill intact  ALL OTHER ROS ARE NEGATIVE   ASSESSMENT AND PLAN SYNOPSIS   63 year old obese white female seen today for follow-up assessment for asthma and OSA in the setting of obesity and deconditioned state  Moderate persistent asthma Continue Symbicort  as prescribed Nebs and albuterol  as needed Avoid Allergens and Irritants Avoid secondhand smoke Avoid SICK contacts Recommend  Masking  when appropriate Recommend Keep up-to-date with vaccinations We will discuss immunological biological therapy at next office visit  Please bring a list of medications at next office visit for inhalers that your insurance will cover  Lets plan to discuss TEZPIRE injection therapy for your    Discussed sleep data and reviewed with patient.  Encouraged proper weight management.  Discussed sleep hygiene Patient uses and benefits from therapy Using CPAP nightly and with naps Settings are comfortable and is sleeping well.  MEDICATION ADJUSTMENTS/LABS AND TESTS ORDERED: Symbicort  2 puffs in the morning 2 puffs at night Albuterol  and albuterol  nebulizer as needed Continue CPAP as prescribed  CURRENT MEDICATIONS REVIEWED AT LENGTH WITH PATIENT TODAY   Patient  satisfied with Plan of action and management. All questions answered   Follow up 3 months   I spent a total of 45 minutes dedicated to the care of this patient on the date of this encounter to include pre-visit review of records, face-to-face time with the patient discussing conditions above, post visit ordering of testing, clinical documentation with the electronic health record,  making appropriate referrals as documented, and communicating necessary information to the patient's healthcare team.    The Patient requires high complexity decision making for assessment and support, frequent evaluation and titration of therapies, application of advanced monitoring technologies and extensive interpretation of multiple databases.  Patient satisfied with Plan of action and management. All questions answered    Nickolas Alm Cellar, M.D.  [facility] Pulmonary & Critical Care Medicine  Medical Director [facility] 

## 2024-11-24 NOTE — Telephone Encounter (Signed)
 Chronic condition Verification form placed in provider folder

## 2024-11-27 NOTE — Telephone Encounter (Signed)
 Form faxed

## 2024-12-08 ENCOUNTER — Encounter: Payer: Self-pay | Admitting: Nurse Practitioner

## 2024-12-08 NOTE — Assessment & Plan Note (Signed)
 Her Type 2 diabetes is managed with Mounjaro . The previous 10 mg dose reportedly caused difficulty drinking water, so it was reduced to 7.5 mg. A1c is well-controlled today at 5.1 with no hypoglycemia reported. Increase Mounjaro  to 10 mg and monitor for difficulty drinking water and hypoglycemia. We will continue to monitor.

## 2024-12-08 NOTE — Assessment & Plan Note (Signed)
 Anxiety is managed with Buspar  and Xanax . She reports increased anxiety but is managing well with current medications. Continue Xanax  as needed and Buspar  twice daily. Refills sent. PDMP reviewed. We will continue to monitor.

## 2024-12-08 NOTE — Assessment & Plan Note (Signed)
 She experiences a chronic cough with thick sputum, recurrent infections exacerbated by chemotherapy-induced lung damage. Previous treatments with azithromycin  and steroids were effective. Prescribe azithromycin  (Z-Pak) and Medrol  Dosepak (methylprednisolone ). Prescribe Bromfed DM for cough, 5-10 mL every 4-6 hours, 10 mL at bedtime. Counseled on common side effects. Continue inhalers and nebulizer solutions as needed. Encourage adequate hydration. Return precautions given to patient.

## 2024-12-08 NOTE — Assessment & Plan Note (Signed)
 She is status post mastectomy with a recent fitting for a new prosthesis and bras. Continue routine follow-up and monitoring.

## 2024-12-08 NOTE — Assessment & Plan Note (Signed)
 Hyperlipidemia is managed with Lipitor. There was a discussion about a potential statin holiday and switching to Crestor due to side effects and family history of diabetes-related complications, but she prefers to stay on Lipitor. Continue Lipitor and discuss potential switch to Crestor at the next visit.

## 2025-01-22 NOTE — Progress Notes (Signed)
 Janet Austin                                          MRN: [id number]   01/22/2025   The [facility]BCI Quality Team Specialist reviewed this patient medical record for the purposes of chart review for care gap closure. The following were reviewed: chart review for care gap closure-kidney health evaluation for diabetes:eGFR  and uACR.    VBCI Quality Team

## 2025-02-17 ENCOUNTER — Ambulatory Visit: Admitting: Nurse Practitioner

## 2025-03-03 ENCOUNTER — Ambulatory Visit: Admitting: Internal Medicine

## 2025-05-11 ENCOUNTER — Ambulatory Visit: Admitting: Dermatology
